# Patient Record
Sex: Female | Born: 1950
Health system: Southern US, Community
[De-identification: ages and names within clinical notes are randomized; demographics above are authoritative.]

## PROBLEM LIST (undated history)

## (undated) DIAGNOSIS — M1990 Unspecified osteoarthritis, unspecified site: Secondary | ICD-10-CM

## (undated) DIAGNOSIS — F419 Anxiety disorder, unspecified: Secondary | ICD-10-CM

## (undated) DIAGNOSIS — R000 Tachycardia, unspecified: Secondary | ICD-10-CM

## (undated) DIAGNOSIS — R011 Cardiac murmur, unspecified: Secondary | ICD-10-CM

## (undated) DIAGNOSIS — N189 Chronic kidney disease, unspecified: Secondary | ICD-10-CM

## (undated) DIAGNOSIS — R51 Headache: Secondary | ICD-10-CM

## (undated) DIAGNOSIS — Z9889 Other specified postprocedural states: Secondary | ICD-10-CM

## (undated) DIAGNOSIS — J189 Pneumonia, unspecified organism: Secondary | ICD-10-CM

## (undated) DIAGNOSIS — R519 Headache, unspecified: Secondary | ICD-10-CM

## (undated) DIAGNOSIS — K219 Gastro-esophageal reflux disease without esophagitis: Secondary | ICD-10-CM

## (undated) DIAGNOSIS — T8859XA Other complications of anesthesia, initial encounter: Secondary | ICD-10-CM

## (undated) DIAGNOSIS — R112 Nausea with vomiting, unspecified: Secondary | ICD-10-CM

## (undated) DIAGNOSIS — D649 Anemia, unspecified: Secondary | ICD-10-CM

## (undated) DIAGNOSIS — T4145XA Adverse effect of unspecified anesthetic, initial encounter: Secondary | ICD-10-CM

## (undated) HISTORY — PX: OTHER SURGICAL HISTORY: SHX169

## (undated) HISTORY — PX: CHOLECYSTECTOMY: SHX55

## (undated) HISTORY — PX: TENDON REPAIR: SHX5111

## (undated) HISTORY — PX: TONSILLECTOMY: SUR1361

## (undated) HISTORY — DX: Cardiac murmur, unspecified: R01.1

## (undated) HISTORY — PX: NEPHRECTOMY: SHX65

## (undated) HISTORY — PX: UPPER GASTROINTESTINAL ENDOSCOPY: SHX188

---

## 1993-08-06 HISTORY — PX: BREAST SURGERY: SHX581

## 1998-01-26 ENCOUNTER — Ambulatory Visit (HOSPITAL_BASED_OUTPATIENT_CLINIC_OR_DEPARTMENT_OTHER): Admission: RE | Admit: 1998-01-26 | Discharge: 1998-01-26 | Payer: Self-pay | Admitting: *Deleted

## 2000-01-17 ENCOUNTER — Encounter: Admission: RE | Admit: 2000-01-17 | Discharge: 2000-01-17 | Payer: Self-pay | Admitting: Obstetrics and Gynecology

## 2000-01-17 ENCOUNTER — Encounter: Payer: Self-pay | Admitting: Obstetrics and Gynecology

## 2000-11-26 ENCOUNTER — Ambulatory Visit (HOSPITAL_COMMUNITY): Admission: RE | Admit: 2000-11-26 | Discharge: 2000-11-26 | Payer: Self-pay | Admitting: Internal Medicine

## 2001-02-13 ENCOUNTER — Encounter: Payer: Self-pay | Admitting: Obstetrics and Gynecology

## 2001-02-13 ENCOUNTER — Encounter: Admission: RE | Admit: 2001-02-13 | Discharge: 2001-02-13 | Payer: Self-pay | Admitting: Obstetrics and Gynecology

## 2002-10-13 ENCOUNTER — Encounter: Admission: RE | Admit: 2002-10-13 | Discharge: 2002-10-13 | Payer: Self-pay | Admitting: Obstetrics and Gynecology

## 2002-10-13 ENCOUNTER — Encounter: Payer: Self-pay | Admitting: Obstetrics and Gynecology

## 2004-01-28 ENCOUNTER — Encounter: Admission: RE | Admit: 2004-01-28 | Discharge: 2004-01-28 | Payer: Self-pay | Admitting: Internal Medicine

## 2004-01-30 ENCOUNTER — Inpatient Hospital Stay (HOSPITAL_COMMUNITY): Admission: EM | Admit: 2004-01-30 | Discharge: 2004-02-02 | Payer: Self-pay | Admitting: Emergency Medicine

## 2004-02-10 ENCOUNTER — Ambulatory Visit (HOSPITAL_COMMUNITY): Admission: RE | Admit: 2004-02-10 | Discharge: 2004-02-10 | Payer: Self-pay | Admitting: Urology

## 2004-03-10 ENCOUNTER — Ambulatory Visit (HOSPITAL_COMMUNITY): Admission: RE | Admit: 2004-03-10 | Discharge: 2004-03-10 | Payer: Self-pay | Admitting: Urology

## 2004-06-26 ENCOUNTER — Encounter (INDEPENDENT_AMBULATORY_CARE_PROVIDER_SITE_OTHER): Payer: Self-pay | Admitting: *Deleted

## 2004-06-26 ENCOUNTER — Inpatient Hospital Stay (HOSPITAL_COMMUNITY): Admission: RE | Admit: 2004-06-26 | Discharge: 2004-06-28 | Payer: Self-pay | Admitting: Urology

## 2008-03-17 ENCOUNTER — Encounter: Admission: RE | Admit: 2008-03-17 | Discharge: 2008-03-17 | Payer: Self-pay | Admitting: Internal Medicine

## 2009-10-04 HISTORY — PX: ERCP: SHX60

## 2009-10-21 ENCOUNTER — Encounter (INDEPENDENT_AMBULATORY_CARE_PROVIDER_SITE_OTHER): Payer: Self-pay | Admitting: General Surgery

## 2009-10-21 ENCOUNTER — Ambulatory Visit: Payer: Self-pay | Admitting: Internal Medicine

## 2009-10-21 ENCOUNTER — Inpatient Hospital Stay (HOSPITAL_COMMUNITY): Admission: EM | Admit: 2009-10-21 | Discharge: 2009-10-23 | Payer: Self-pay | Admitting: Emergency Medicine

## 2010-08-11 ENCOUNTER — Encounter (INDEPENDENT_AMBULATORY_CARE_PROVIDER_SITE_OTHER): Payer: Self-pay | Admitting: *Deleted

## 2010-08-11 ENCOUNTER — Encounter: Payer: Self-pay | Admitting: Internal Medicine

## 2010-09-01 ENCOUNTER — Encounter (INDEPENDENT_AMBULATORY_CARE_PROVIDER_SITE_OTHER): Payer: Self-pay | Admitting: *Deleted

## 2010-09-01 ENCOUNTER — Other Ambulatory Visit: Payer: Self-pay | Admitting: Dermatology

## 2010-09-04 ENCOUNTER — Ambulatory Visit
Admission: RE | Admit: 2010-09-04 | Discharge: 2010-09-04 | Payer: Self-pay | Source: Home / Self Care | Attending: Internal Medicine | Admitting: Internal Medicine

## 2010-09-07 NOTE — Letter (Signed)
Summary: Pre Visit Letter Revised  [facility]  [address] [place], [place] [address]   Phone: [phone]  Fax: [phone]        08/11/2010 MRN: [id number] [facility] [address] [place], [place]  [address]             Procedure Date:  09-18-10   Welcome to the Gastroenterology Division at [facility].    You are scheduled to see a nurse for your pre-procedure visit on 09-04-10 at 10:30A.M. on the 3rd floor at [facility], [address]  We ask that you try to arrive at our office 15 minutes prior to your appointment time to allow for check-in.  Please take a minute to review the attached form.  If you answer "Yes" to one or more of the questions on the first page, we ask that you call the person listed at your earliest opportunity.  If you answer "No" to all of the questions, please complete the rest of the form and bring it to your appointment.    Your nurse visit will consist of discussing your medical and surgical history, your immediate family medical history, and your medications.   If you are unable to list all of your medications on the form, please bring the medication bottles to your appointment and we will list them.  We will need to be aware of both prescribed and over the counter drugs.  We will need to know exact dosage information as well.    Please be prepared to read and sign documents such as consent forms, a financial agreement, and acknowledgement forms.  If necessary, and with your consent, a friend or relative is welcome to sit-in on the nurse visit with you.  Please bring your insurance card so that we may make a copy of it.  If your insurance requires a referral to see a specialist, please bring your referral form from your primary care physician.  No co-pay is required for this nurse visit.     If you cannot keep your appointment, please call [phone] to cancel or reschedule prior to your appointment date.  This allows  [place] the opportunity to schedule an appointment for another patient in need of care.    Thank you for choosing [facility] for your medical needs.  We appreciate the opportunity to care for you.  Please visit [place] at our website  to learn more about our practice.  Sincerely, The Gastroenterology Division

## 2010-09-07 NOTE — Letter (Signed)
Summary: Medical Hx & Medication/[facility]  Medical Hx & Medication/[facility]   Imported By: Sherian Rein 08/23/2010 15:14:28  _____________________________________________________________________  External Attachment:    Type:   Image     Comment:   External Document

## 2010-09-07 NOTE — Letter (Signed)
Summary: [facility]   Imported By: Sherian Rein 08/23/2010 15:12:42  _____________________________________________________________________  External Attachment:    Type:   Image     Comment:   External Document

## 2010-09-13 NOTE — Letter (Signed)
Summary: [facility] Instructions  [facility]  [address] [place], [place] [address]   Phone: [phone]  Fax: [phone]       [name]    02-19-1951    MRN: [id number]        Procedure Day /Date: Monday 09-18-10     Arrival Time: 9:30 am     Procedure Time: 10:30 am     Location of Procedure:                    _x _  [facility] (4th Floor)                        PREPARATION FOR COLONOSCOPY WITH MOVIPREP   Starting 5 days prior to your procedure  09-13-10  do not eat nuts, seeds, popcorn, corn, beans, peas,  salads, or any raw vegetables.  Do not take any fiber supplements (e.g. Metamucil, Citrucel, and Benefiber).  THE DAY BEFORE YOUR PROCEDURE         DATE:  09-17-10   DAY: Sunday   1.  Drink clear liquids the entire day-NO SOLID FOOD  2.  Do not drink anything colored red or purple.  Avoid juices with pulp.  No orange juice.  3.  Drink at least 64 oz. (8 glasses) of fluid/clear liquids during the day to prevent dehydration and help the prep work efficiently.  CLEAR LIQUIDS INCLUDE: Water Jello Ice Popsicles Tea (sugar ok, no milk/cream) Powdered fruit flavored drinks Coffee (sugar ok, no milk/cream) Gatorade Juice: apple, white grape, white cranberry  Lemonade Clear bullion, consomm, broth Carbonated beverages (any kind) Strained chicken noodle soup Hard Candy                             4.  In the morning, mix first dose of MoviPrep solution:    Empty 1 Pouch A and 1 Pouch B into the disposable container    Add lukewarm drinking water to the top line of the container. Mix to dissolve    Refrigerate (mixed solution should be used within 24 hrs)  5.  Begin drinking the prep at 5:00 p.m. The MoviPrep container is divided by 4 marks.   Every 15 minutes drink the solution down to the next mark (approximately 8 oz) until the full liter is complete.   6.  Follow completed prep with 16 oz of clear liquid of your choice  (Nothing red or purple).  Continue to drink clear liquids until bedtime.  7.  Before going to bed, mix second dose of MoviPrep solution:    Empty 1 Pouch A and 1 Pouch B into the disposable container    Add lukewarm drinking water to the top line of the container. Mix to dissolve    Refrigerate  THE DAY OF YOUR PROCEDURE      DATE:  09-18-10  DAY: Monday  Beginning at  5:30 a.m. (5 hours before procedure):         1. Every 15 minutes, drink the solution down to the next mark (approx 8 oz) until the full liter is complete.  2. Follow completed prep with 16 oz. of clear liquid of your choice.    3. You may drink clear liquids until  8:30 a.m. (2 HOURS BEFORE PROCEDURE).   MEDICATION INSTRUCTIONS  Unless otherwise instructed, you should take regular prescription medications with a small sip of water  as early as possible the morning of your procedure.          OTHER INSTRUCTIONS  You will need a responsible adult at least 59 years of age to accompany you and drive you home.   This person must remain in the waiting room during your procedure.  Wear loose fitting clothing that is easily removed.  Leave jewelry and other valuables at home.  However, you may wish to bring a book to read or  an iPod/MP3 player to listen to music as you wait for your procedure to start.  Remove all body piercing jewelry and leave at home.  Total time from sign-in until discharge is approximately 2-3 hours.  You should go home directly after your procedure and rest.  You can resume normal activities the  day after your procedure.  The day of your procedure you should not:   Drive   Make legal decisions   Operate machinery   Drink alcohol   Return to work  You will receive specific instructions about eating, activities and medications before you leave.    The above instructions have been reviewed and explained to me by   Ezra Sites RN  September 04, 2010 10:41 AM    I fully  understand and can verbalize these instructions _____________________________ Date _________

## 2010-09-13 NOTE — Miscellaneous (Signed)
Summary: [facility]  Clinical Lists Changes  Medications: Added new medication of MOVIPREP 100 GM  SOLR (PEG-KCL-NACL-NASULF-NA ASC-C) As per prep instructions. - Signed Rx of MOVIPREP 100 GM  SOLR (PEG-KCL-NACL-NASULF-NA ASC-C) As per prep instructions.;  #1 x 0;  Signed;  Entered by: Ezra Sites RN;  Authorized by: Iva Boop MD, [facility];  Method used: Electronically to [facility] [address], [place], [place]  [address], Ph: [phone] or [phone], Fax: [phone] Observations: Added new observation of NKA: T (09/04/2010 10:24)    Prescriptions: MOVIPREP 100 GM  SOLR (PEG-KCL-NACL-NASULF-NA ASC-C) As per prep instructions.  #1 x 0   Entered by:   Ezra Sites RN   Authorized by:   Iva Boop MD, [facility]   Signed by:   Ezra Sites RN on 09/04/2010   Method used:   Electronically to        [facility] (retail)       [address]       [place], [place]  [address]       Ph: [phone] or [phone]       Fax: [phone]   RxID:   [id number]

## 2010-09-18 ENCOUNTER — Other Ambulatory Visit: Payer: Self-pay | Admitting: Internal Medicine

## 2010-10-27 ENCOUNTER — Encounter: Payer: Self-pay | Admitting: Internal Medicine

## 2010-10-29 LAB — CBC
HCT: 33.7 % — ABNORMAL LOW (ref 36.0–46.0)
Hemoglobin: 10.4 g/dL — ABNORMAL LOW (ref 12.0–15.0)
Hemoglobin: 11.3 g/dL — ABNORMAL LOW (ref 12.0–15.0)
MCHC: 33.6 g/dL (ref 30.0–36.0)
MCHC: 33.9 g/dL (ref 30.0–36.0)
Platelets: 130 10*3/uL — ABNORMAL LOW (ref 150–400)
Platelets: 175 10*3/uL (ref 150–400)
RBC: 3.32 MIL/uL — ABNORMAL LOW (ref 3.87–5.11)
RBC: 3.64 MIL/uL — ABNORMAL LOW (ref 3.87–5.11)
RDW: 13.8 % (ref 11.5–15.5)
RDW: 14.4 % (ref 11.5–15.5)
WBC: 4.9 10*3/uL (ref 4.0–10.5)

## 2010-10-29 LAB — COMPREHENSIVE METABOLIC PANEL
ALT: 474 U/L — ABNORMAL HIGH (ref 0–35)
ALT: 610 U/L — ABNORMAL HIGH (ref 0–35)
ALT: 70 U/L — ABNORMAL HIGH (ref 0–35)
AST: 252 U/L — ABNORMAL HIGH (ref 0–37)
AST: 643 U/L — ABNORMAL HIGH (ref 0–37)
Alkaline Phosphatase: 172 U/L — ABNORMAL HIGH (ref 39–117)
Alkaline Phosphatase: 73 U/L (ref 39–117)
CO2: 23 mEq/L (ref 19–32)
Calcium: 9 mg/dL (ref 8.4–10.5)
Chloride: 104 mEq/L (ref 96–112)
Chloride: 106 mEq/L (ref 96–112)
GFR calc Af Amer: 59 mL/min — ABNORMAL LOW (ref >60)
GFR calc Af Amer: >60 mL/min (ref >60)
GFR calc Af Amer: >60 mL/min (ref >60)
GFR calc non Af Amer: 49 mL/min — ABNORMAL LOW (ref >60)
GFR calc non Af Amer: >60 mL/min (ref >60)
Glucose, Bld: 99 mg/dL (ref 70–99)
Sodium: 135 mEq/L (ref 135–145)
Sodium: 145 mEq/L (ref 135–145)
Total Bilirubin: 0.3 mg/dL (ref 0.3–1.2)
Total Bilirubin: 3.3 mg/dL — ABNORMAL HIGH (ref 0.3–1.2)
Total Protein: 6 g/dL (ref 6.0–8.3)

## 2010-10-29 LAB — URINALYSIS, ROUTINE W REFLEX MICROSCOPIC
Bilirubin Urine: NEGATIVE
Specific Gravity, Urine: 1.015 (ref 1.005–1.030)
pH: 6 (ref 5.0–8.0)

## 2010-10-29 LAB — DIFFERENTIAL
Basophils Absolute: 0.1 10*3/uL (ref 0.0–0.1)
Basophils Relative: 1 % (ref 0–1)
Monocytes Absolute: 0.6 10*3/uL (ref 0.1–1.0)
Neutrophils Relative %: 57 % (ref 43–77)

## 2010-10-29 LAB — URINE MICROSCOPIC-ADD ON

## 2010-10-30 ENCOUNTER — Encounter: Payer: Self-pay | Admitting: Internal Medicine

## 2010-10-30 ENCOUNTER — Ambulatory Visit (AMBULATORY_SURGERY_CENTER): Payer: BLUE CROSS/BLUE SHIELD | Admitting: Internal Medicine

## 2010-10-30 VITALS — BP 148/74 | HR 77 | Temp 98.5°F | Resp 26 | Ht 65.0 in | Wt 155.0 lb

## 2010-10-30 DIAGNOSIS — K621 Rectal polyp: Secondary | ICD-10-CM

## 2010-10-30 DIAGNOSIS — K5730 Diverticulosis of large intestine without perforation or abscess without bleeding: Secondary | ICD-10-CM

## 2010-10-30 DIAGNOSIS — D126 Benign neoplasm of colon, unspecified: Secondary | ICD-10-CM

## 2010-10-30 DIAGNOSIS — D129 Benign neoplasm of anus and anal canal: Secondary | ICD-10-CM

## 2010-10-30 DIAGNOSIS — K620 Anal polyp: Secondary | ICD-10-CM

## 2010-10-30 DIAGNOSIS — D128 Benign neoplasm of rectum: Secondary | ICD-10-CM

## 2010-10-30 DIAGNOSIS — Z1211 Encounter for screening for malignant neoplasm of colon: Secondary | ICD-10-CM

## 2010-10-30 NOTE — Progress Notes (Signed)
PER MD--POLYP,MILD SIGMOID DIVERTICULOSIS, SMALL HEMORRHOIDS

## 2010-10-30 NOTE — Patient Instructions (Signed)
See discharge instructions and educational materials. Call [facility] [phone] for any problems or questions.

## 2010-10-31 ENCOUNTER — Telehealth: Payer: Self-pay | Admitting: *Deleted

## 2010-10-31 NOTE — Telephone Encounter (Signed)

## 2010-11-03 ENCOUNTER — Encounter: Payer: Self-pay | Admitting: Internal Medicine

## 2010-11-03 NOTE — Progress Notes (Signed)
Quick Note:  2 hyperplastic polyps Int hemorrhoids Diverticulosis 10/2020 repeat colonoscopy ______

## 2010-11-07 NOTE — Procedures (Signed)
Summary: Colonoscopy  Patient: [name] Note: All result statuses are Final unless otherwise noted.  Tests: (1) Colonoscopy (COL)   COL Colonoscopy           DONE     [facility]     [address]     [place], [place]  [address]          COLONOSCOPY PROCEDURE REPORT          PATIENT:  [name], [name]  MR#:  [id number]     BIRTHDATE:  09-13-1950, 60 yrs. old  GENDER:  female     ENDOSCOPIST:  Iva Boop, MD, [facility]     REF. BY:  [name], M.D.     PROCEDURE DATE:  10/30/2010     PROCEDURE:  Colonoscopy with biopsy and snare polypectomy     ASA CLASS:  Class I     INDICATIONS:  Routine Risk Screening     MEDICATIONS:   Fentanyl 75 mcg IV, Versed 8 mg          DESCRIPTION OF PROCEDURE:   After the risks benefits and     alternatives of the procedure were thoroughly explained, informed     consent was obtained.  Digital rectal exam was performed and     revealed no abnormalities.   The LB [id number]-[id number] endoscope     was introduced through the anus and advanced to the cecum, which     was identified by both the appendix and ileocecal valve, without     limitations.  The quality of the prep was excellent, using     MoviPrep.  The instrument was then slowly withdrawn as the colon     was fully examined. Insertion: 7 minutes 6 seconds , withdrawal:     15 minutes 40 seconds.     <<PROCEDUREIMAGES>>          FINDINGS:  Two polyps were found. 3 mm distal sigmoid polyp     removed by cold biopsy. 5 mm flat rectal polyp removed by cold     snare. Both sent to pathology.  Mild diverticulosis was found in     the sigmoid colon.  This was otherwise a normal examination of the     colon.   Retroflexed views in the rectum revealed internal     hemorrhoids.    The scope was then withdrawn from the patient and     the procedure completed.          COMPLICATIONS:  None     ENDOSCOPIC IMPRESSION:     1) Two diminutive  polyps removed     2) Mild diverticulosis  in the sigmoid colon     3) Internal hemorrhoids - small     4) Otherwise normal examination with excellent prep          REPEAT EXAM:  In for Colonoscopy, pending biopsy results.          Iva Boop, MD, [facility]          CC:  [name], MD and The Patient          n.     eSIGNED:   Iva Boop at 10/30/2010 10:17 AM          Alejandro Mulling, [id number]  Note: An exclamation mark (!) indicates a result that was not dispersed into the flowsheet. Document Creation Date: 10/30/2010 10:17 AM _______________________________________________________________________  (1) Order result status: Final Collection or observation  date-time: 10/30/2010 09:48 Requested date-time:  Receipt date-time:  Reported date-time:  Referring Physician:   Ordering Physician: Stan Head [phone]) Specimen Source:  Source: Launa Grill Order Number: [id number] Lab site:

## 2010-12-22 NOTE — Discharge Summary (Signed)
[name], [name]               ACCOUNT NO.:  [id number]   MEDICAL RECORD NO.:  [id number]          PATIENT TYPE:  INP   LOCATION:  0342                         FACILITY:  [facility]   PHYSICIAN:  Lindaann Slough, M.D.  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   PROCEDURE:  Left nephrectomy on June 26, 2004.   HISTORY OF PRESENT ILLNESS:  The patient is a 60 year old female who was  seen in June 2005 for 2 days history of left flank pain. A CT scan done  before the visit had shown a complex cyst in the upper pole of the left  kidney. Then, she spiked a temperature to 103.5, and CT scan of the abdomen  and pelvis with contrast showed that the complex cyst was really an  obstructed upper pole left kidney. A percutaneous drainage of the upper pole  of the kidney was done, and subsequently an antegrade double J catheter was  inserted in the kidney. The patient was scheduled for left upper pole  nephrectomy. At the time of surgery, it was felt that it was best to remove  the kidney. Because of the location of the renal vessels, it was difficult  to remove the upper pole and also the inflammatory changes of the kidney.   PHYSICAL EXAMINATION:  VITAL SIGNS:  On physical exam, her blood pressure  was 128/78, pulse 82, respirations 16, temperature 97.8, and weight 166  pounds.  LUNGS:  Clear.  HEART:  Regular rhythm.  ABDOMEN:  Soft and nondistended, nontender. Kidneys not palpable. Bowel  sounds were normal.   LABORATORY DATA:  Her hemoglobin on admission was 12.4, hematocrit 36.8, and  WBC 4.1, BUN 15, creatinine 1.2, sodium 138, potassium 4.5. Urinalysis  showed 25,000 colonies of multiple species. Chest x-ray showed no evidence  of active disease. An EKG showed marked sinus  bradycardia.   HOSPITAL COURSE:  Postoperatively, the patient was lethargic on the first  day postoperative, probably secondary to pain medication. The pain  medication was then discontinued, and on the second day postoperative, she  started to feel better. She started to eat better, and she was voiding on  her own, and she remained afebrile throughout the hospital course. On the  evening of June 28, 2004, she was afebrile. She was eating well. Her  wound was clean and dry. Pathology report showed chronic interstitial  nephritis in the upper pole and mild interstitial chronic inflammation of  the lower pole. There was no evidence of malignancy.   DISCHARGE MEDICATIONS:  The patient was discharged home on:  1.  Keflex 250 mg p.o. 3 times a day,  2.  Darvocet 100 one or two tablets q.4h. p.r.n. for pain.   DISCHARGE DIET:  Regular.   CONDITION ON DISCHARGE:  Improved.   ACTIVITY:  The patient is instructed not to do any lifting, straining,  driving for about six weeks.  FOLLOW UP:  She will be followed in the office in about 3 weeks.      MN/MEDQ  D:  06/28/2004  T:  06/29/2004  Job:  [id number]   cc:   [name], M.D.  [address] [place]  [place] [address]  Fax: [phone]

## 2010-12-22 NOTE — Consult Note (Signed)
[name], [name]                         ACCOUNT NO.:  [id number]   MEDICAL RECORD NO.:  [id number]                   PATIENT TYPE:  INP   LOCATION:  5524                                 FACILITY:  [facility]   PHYSICIAN:  Lindaann Slough, M.D.               DATE OF BIRTH:  April 10, 1951   DATE OF CONSULTATION:  01/30/2004  DATE OF DISCHARGE:                                   CONSULTATION   CONSULTING PHYSICIAN:  Lindaann Slough, M.D.   REASON FOR CONSULTATION:  Left flank pain.   The patient is a 60 year old female who was seen in the office on Friday for  a two day history of left flank pain.  She was seen by Dr. Earl Gala and had a  CT scan of the abdomen and pelvis and that showed a complex cyst in the  upper pole of the left kidney.  The pain has not been associated with nausea  and vomiting.  She was given 100 mg of Demerol IM and was sent home on oral  Demerol, and she was scheduled for a CT scan of the abdomen and pelvis with  IV and oral contrast; however, yesterday, she spiked a temperature to 103.5  and was then admitted for further evaluation and treatment.  She still does  not have any nausea or vomiting.  She did not have a bowel movement  yesterday, but she did not have any changes in her bowel habits.   PAST MEDICAL HISTORY:  Asthma.   PAST SURGICAL HISTORY:  Laparoscopy surgery on her wrist.   FAMILY HISTORY:  Positive for diabetes.   SOCIAL HISTORY:  She is married, has two children.  She does not smoke and  drinks a glass of wine a day.   ALLERGIES:  No known drug allergies.   PHYSICAL EXAMINATION:  VITAL SIGNS:  Her temperature went up to 104.1, blood  pressure is 130/52, pulse 115, respirations 20.  GENITOURINARY:  She has left CVA tenderness and tenderness of the left  flank.   Her hemoglobin is 9.9, hematocrit 29.0, and WBC 12.1, neutrophils 83%.  Sodium is 134, potassium 3.6, BUN is 12, creatinine 1.4.  Her urinalysis  shows 100 mg of protein and 15 mg  of ketones, 7-10 WBCs, 0-2 RBCs, and a few  bacteria.   IMPRESSION:  Complex cyst on the left kidney, rule out perinephric abscess,  and rule out renal abscess.   SUGGESTION:  CT scan of the abdomen and pelvis without and with IV and oral  contrast.   We will follow the patient with you.                                               Lindaann Slough, M.D.   MN/MEDQ  D:  01/30/2004  T:  01/30/2004  Job:  [id number]

## 2010-12-22 NOTE — H&P (Signed)
NAMEVANNIE, [name]                         ACCOUNT NO.:  [id number]   MEDICAL RECORD NO.:  [id number]                   PATIENT TYPE:  INP   LOCATION:  1828                                 FACILITY:  [facility]   PHYSICIAN:  Hal T. Stoneking, M.D.              DATE OF BIRTH:  Dec 22, 1950   DATE OF ADMISSION:  01/30/2004  DATE OF DISCHARGE:                                HISTORY & PHYSICAL   IDENTIFYING DATA:  Ms. Hyneman is a very-nice, healthy, 60 year old white  female.  She has had apparently a prior history of periodic back pain.  She  has been told in the past that she had a renal cyst.  She was in her usual  state of health until this past Tuesday night when she developed fairly-  severe, left lower-pelvic and back pain.  She had been seen in the office on  Wednesday, Thursday and Friday.  On Thursday and Friday, she had an  injection of Rocephin on each of those days.  On Friday, she had a CT scan  without contrast.  Preliminary verbal report revealed a fairly large cyst in  the left kidney that very well could be infected.  She saw Dr. Brunilda Payor of  urology Friday evening, and he offered to admit her at that point, but also  told her some additional testing would need to be done to confirm if this  was an infected cyst.  She elected to return home.  Yesterday, she felt a  little bit better, but then late in the afternoon had a shaking chill and a  fever of 103.5.  She has had no cough, no diarrhea, and again complains of  the severe left-lower quadrant to pelvic and left-flank pain.   ALLERGIES:  She is allergic to CODEINE.   CURRENT MEDICATIONS:  P.r.n. Flonase and albuterol.   PAST MEDICAL HISTORY:  1. Remarkable for allergic rhinitis and asthma, fairly mild.  2. Past history of renal cyst.  3. No history of diabetes, coronary artery disease, stroke, hypertension or     cancer.   PREVIOUS SURGERY:  She had laparoscopic pelvic exam a number of years ago,  no specific diagnosis  rendered.  She has had a couple of cysts removed from  her wrist in the past.   FAMILY HISTORY:  Remarkable for diabetes mellitus in distant family members.  Her father had rheumatic fever and resultant rheumatic valvular heart  disease.   SOCIAL HISTORY:  She is married and has two daughter, ages 46 and 9.  She  has a __________ [employer].  She does not smoke.  She drinks one  glass of wine each evening.   REVIEW OF SYSTEMS:  No headache, no change in vision or hearing, no cough,  no shortness of breath, flank and back pain -- please see HPI.  She denies  any dysuria, no vaginal bleeding or discharge.   PHYSICAL EXAMINATION:  VITAL SIGNS:  Temperature 102.9, blood pressure  146/77, O2 saturations 93%, pulse 110.  HEENT:  Pupils equal, round and reactive to light.  Discs are sharp.  TM's  are normal.  NECK:  Supple.  LUNGS:  Clear.  HEART:  Regular tachycardia without murmur.  ABDOMEN:  Bowel sounds are present. No hepatosplenomegaly or masses  palpated.  She is tender to percussion in the left lower-quadrant.  PELVIC/RECTAL:  No done.  She has a routine pelvic exam about one week ago  by her gynecologist.  EXTREMITIES:  She has no lower-extremity edema.   LABORATORY DATA:  On admission, urinalysis remarkable for 7-10 wbc's, 0-2  rbc's.  Sodium 134, potassium 3.6, chloride 103, BUN 12, glucose 99.  White  count 12,100, hemoglobin 10.3, MCV 89.4, RDW 13.4, platelet count 187,000,  83% neutrophils, 7% lymphocytes, 10% monocytes.   Preliminary CT scan report reveals possible infected left renal cyst.   ASSESSMENT:  1. Infected left nephric cyst.  She will need IV antibiotics and likely need     percutaneous drainage.  We will need to contact urology regarding the     best approach.  2. Anemia.  Question if this might be mild iron-deficiency anemia on top of     bone marrow suppression due to her current bacterial infection.   PLAN:  1. Admit.  2. She has already had  blood and urine cultures.  We will await these     results.  3. We will also start nafcillin and gentamicin.  4. Obtain urology consultation.                                                Hal T. Pete Glatter, M.D.    HTS/MEDQ  D:  01/30/2004  T:  01/30/2004  Job:  [id number]

## 2010-12-22 NOTE — Discharge Summary (Signed)
[name], Calderon                         ACCOUNT NO.:  [id number]   MEDICAL RECORD NO.:  [id number]                   PATIENT TYPE:  INP   LOCATION:  5524                                 FACILITY:  [facility]   PHYSICIAN:  [name], M.D.                 DATE OF BIRTH:  1951-06-09   DATE OF ADMISSION:  01/29/2004  DATE OF DISCHARGE:  02/02/2004                                 DISCHARGE SUMMARY   ADMISSION DIAGNOSIS:  Pyelonephritis.   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   HISTORY OF PRESENT ILLNESS:  The patient is a 60 year old, white female with  no significant past medical history.  She said she has mild asthma for which  she uses albuterol on a very rare basis.  She developed back pain 3 days  prior to admission and was seen in the office with evidence of  pyelonephritis and treated with Rocephin.  She did not improve.  She  underwent a CT scan without contrast which revealed evidence of  pyelonephrosis.  She was seen in consultation by Dr. Brunilda Payor, but was feeling  somewhat better.  She was not admitted at that time.  Subsequent to that,  however, she developed fever and chills and came to the emergency room and  admitted.   HOSPITAL COURSE:  The patient was admitted and placed on IV antibiotics.  She was seen by Dr. Brunilda Payor in consultation.  A repeat CT scan was done at this  time with contrast.  This revealed findings of what was thought to be an  accessory kidney.  There was evidence of pyelonephrosis.  On June 26, the  patient was underwent percutaneous nephrostomy for external drainage.  This  was quite successful and the patient improved considerably.  Her antibiotics  were changed to oral and she was instructed on how to take care of an  external drainage catheter.  On June 28, there was an attempt to put in a  double-J stent between the infected kidney and the ureter, but this was not  successful.  Therefore, it was decided the patient could go home with  external drainage.   CONDITION ON DISCHARGE:  The patient was discharged in improved condition.   DISCHARGE MEDICATIONS:  1. Levaquin 5 mg daily x10 days.  2. Use Tylenol as needed for pain management.   ACTIVITY:  No restrictions.   WOUND CARE:  She was instructed in care of the percutaneous nephrostomy.   FOLLOW UP:  She will call to make an appointment to see me in 2 weeks.  She  will see Dr. Brunilda Payor and follow with him as well.  [name], M.D.    JO/MEDQ  D:  03/02/2004  T:  03/02/2004  Job:  [id number]

## 2010-12-22 NOTE — Op Note (Signed)
NAMESOPHRONIA, VARNEY               ACCOUNT NO.:  [id number]   MEDICAL RECORD NO.:  [id number]          PATIENT TYPE:  INP   LOCATION:  0342                         FACILITY:  [facility]   PHYSICIAN:  Lindaann Slough, M.D.  DATE OF BIRTH:  [birth year]   DATE OF PROCEDURE:  06/26/2004  DATE OF DISCHARGE:                                 OPERATIVE REPORT   PREOPERATIVE DIAGNOSIS:  Obstructed upper pole left kidney.   POSTOPERATIVE DIAGNOSIS:  Obstructed upper pole left kidney.   PROCEDURE PERFORMED:  Left nephrectomy.   SURGEON:  Lindaann Slough, M.D.   ASSISTANTS:  Sigmund I. Patsi Sears, M.D., Rhae Lerner, M.D.   ANESTHESIA:  General.   INDICATIONS:  Patient is a 60 year old female who was seen in the office in  June, 2005 for left flank pain.  A CT scan done before her office visit  showed a complex cyst in the upper pole on the left kidney.  Two days later,  she was seen in the emergency room with a temperature of 103.1.  A CT scan  of the abdomen and pelvis with IV contrast showed an obstructed upper pole  left kidney.  Percutaneous nephrostomy was done to drain the upper pole of  the kidney.  She subsequently had an antegrade insertion of a double-J  catheter.  Patient is scheduled today for exploration of the left kidney  with upper pole nephrectomy.  Under general anesthesia, the patient was  prepped and draped and placed in the right lateral decubitus position, left  side up.  A transverse incision was then made over the 12th rib.  The  incision was carried down to the subcutaneous tissues.  Then the external  oblique muscle was incised.  The tip of the 12th rib was dissected from the  surrounding tissues and excised.  During the dissection of the 12th rib,  there was puncture of the pleura.  That opening was closed with 3-0 Vicryl.  After over-inflating the lung and putting a #8 red Robinson catheter through  that opening, the incision was closed with 3-0 Vicryl.  Then  the internal  oblique muscle was incised, and the dorsal lumbar fascia was incised.  The  kidney was then dissected from the surrounding tissues.  The upper pole was  adherent to the surrounding tissues but was bluntly and sharply dissected  from the surrounding tissues.  The renal vessels entered the kidney through  the upper pole.  Because of the location of the renal vessels, it was felt  that it was better to remove the entire kidney.  It would be difficult to do  an upper pole nephrectomy while sparing the renal vessels.  Also, the renal  artery was also small as well as the renal vein.  The renal artery was then  ligated with hemoclips and cut in the __________  ligatures with two clips  on the remaining stump.  The renal vein was also doubly ligated with  hemoclips and cut in between ligatures with two clips on the remaining  stump.  The kidney was then completely dissected from the surrounding  tissues.  The ureter was identified, and a ureterotomy was then done.  Then  the double-J catheter was removed.  The ureter was then ligated with  hemoclips, and the kidney was removed.  Hemostasis was completed with  electrocautery.  The wound was then irrigated with normal saline.  The  incision was then closed in three layers as follows:  The dorsal lumbar  fascia and transversus muscle.  The transversus muscle was closed with 0  PDS.  The internal oblique was closed with 0 PDS.  The external oblique was  closed with 0 PDS.  The skin was closed with 4-0 Monocryl using subcuticular  sutures.  __________ occasion.   ESTIMATED BLOOD LOSS:  100 cc.   BLOOD REPLACEMENT:  None.   Patient tolerated the procedure well and left the OR in satisfactory  condition to post anesthesia care unit.      MN/MEDQ  D:  06/26/2004  T:  06/26/2004  Job:  [id number]   cc:   [name], M.D.  [address] [place]  [place] [address]  Fax: [phone]

## 2010-12-22 NOTE — H&P (Signed)
NAMEGIZZELLE, [name]               ACCOUNT NO.:  [id number]   MEDICAL RECORD NO.:  [id number]          PATIENT TYPE:  INP   LOCATION:  X003                         FACILITY:  [facility]   PHYSICIAN:  Lindaann Slough, M.D.  DATE OF BIRTH:  [birth year]   DATE OF ADMISSION:  06/26/2004  DATE OF DISCHARGE:                                HISTORY & PHYSICAL   CHIEF COMPLAINT:  Obstructed upper pole, left kidney.   HISTORY OF PRESENT ILLNESS:  The patient is a 60 year old female who was  first seen in the office in June of 2005 for a 2-day history of left flank  pain. A CT scan of the abdomen and pelvis done by Dr. [name] showed a  complex cyst in the upper pole of the left kidney. Then the patient spiked  temperature to 103.5 and a CT scan of the abdomen and pelvis with contrast  showed that the complex cyst was an obstructive upper pole of left kidney.  A left percutaneous drainage of the upper pole kidney was done and  subsequently, she had an antegrade insertion of a double J catheter.  She is  scheduled today for left upper pole nephrectomy.   PAST MEDICAL HISTORY:  She has a history of asthma.   PAST SURGICAL HISTORY:  She had surgery on her wrist in 1989 and laparoscopy  in 1978.   FAMILY HISTORY:  Her father died of a heart attack at age 68 and had  prostate cancer and hypertension.  Her mother died of colon cancer at age 8  and had diabetes.  She has 1 sister and 1 brother.   SOCIAL HISTORY:  She is married and has 2 children, does not smoke and  drinks occasionally.   ALLERGIES:  She has no known drug allergies.   MEDICATIONS:  She is on Flonase, Vicodin p.r.n., Phenergan p.r.n.   REVIEW OF SYSTEMS:  She has a dry cough.  She has no shortness of breath, no  palpitation.  She has no nausea and no vomiting, no diarrhea or constipation  and she has frequency at this time and no dysuria hematuria.   PHYSICAL EXAMINATION:  GENERAL:  This is a well-built 60 year old female  in  no acute distress.  VITAL SIGNS:  Her blood pressure is 128/78, pulse 82, respirations 16,  temperature 97.8 and weight 166 pounds.  HEENT:  Her head is normal.  Pupils are equal and reactive to light and  accommodation.  Ears, nose and throat within normal limits.  NECK:  Supple.  No cervical lymph nodes.  No thyromegaly.  CHEST:  Symmetrical.  Lungs are fully expanded and clear to percussion and  auscultation.  HEART:  Regular rhythm.  No murmurs.  No gallops.  ABDOMEN:  Abdomen is soft, non-distended and nontender.  She has no CVA  tenderness.  Kidneys are not palpable.  She has no hepatomegaly and no  splenomegaly.  Bowel sounds are normal.  GENITALIA:  She has normal external female genitalia.  Her meatus is normal.  She has no caruncle.  She has no cystocele and she has no tenderness  in the  bladder area.  The cervix is firm, in the midline and nontender.   IMPRESSION:  Obstructed upper pole, left kidney.      MN/MEDQ  D:  06/26/2004  T:  06/26/2004  Job:  [id number]

## 2011-02-23 IMAGING — US US ABDOMEN COMPLETE
1 series · 14 of 25 positions shown · non-contrast
Comparison: CT abdomen 01/30/2004.

CLINICAL DATA: 59-year-old female with right upper quadrant pain.

COMPLETE ABDOMINAL ULTRASOUND

[Series 1: us abdomen complete · 0.30mm/px · 14 of 46 slices shown]
[im 1/46]
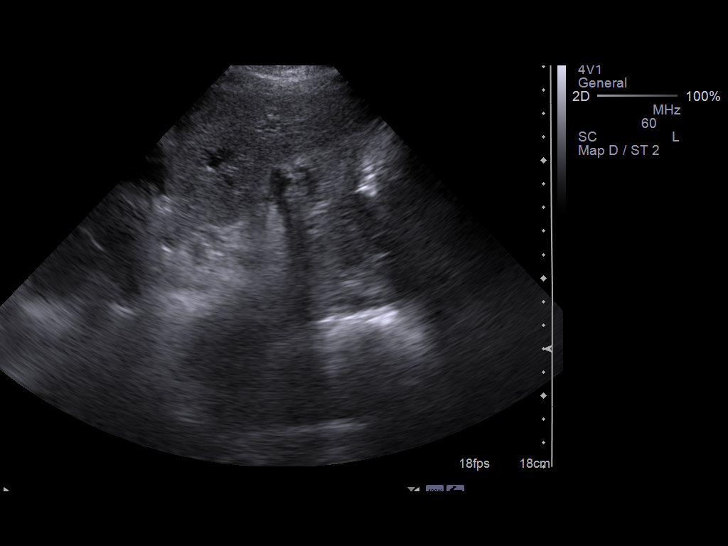
[im 4/46]
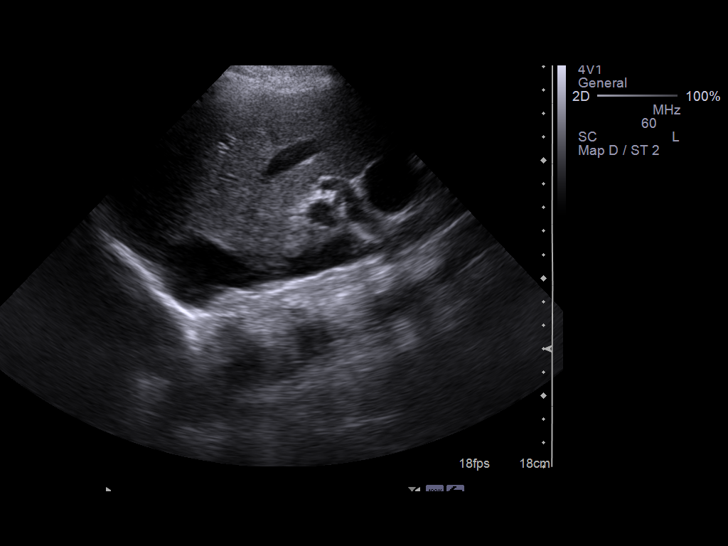
[im 8/46]
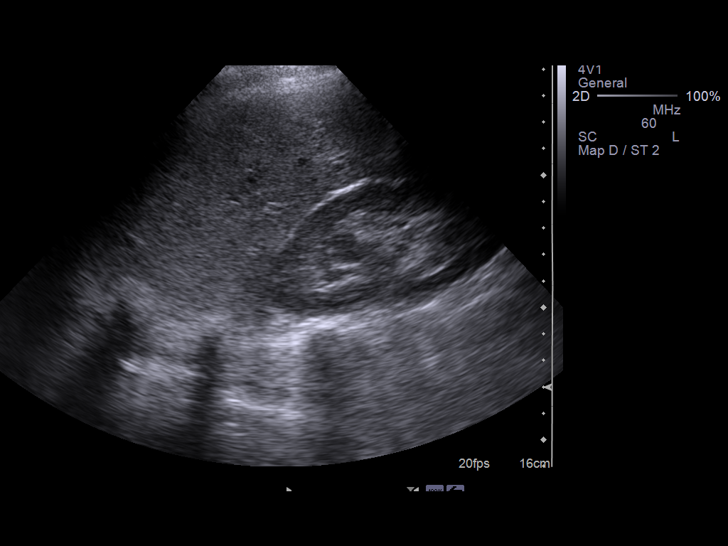
[im 12/46]
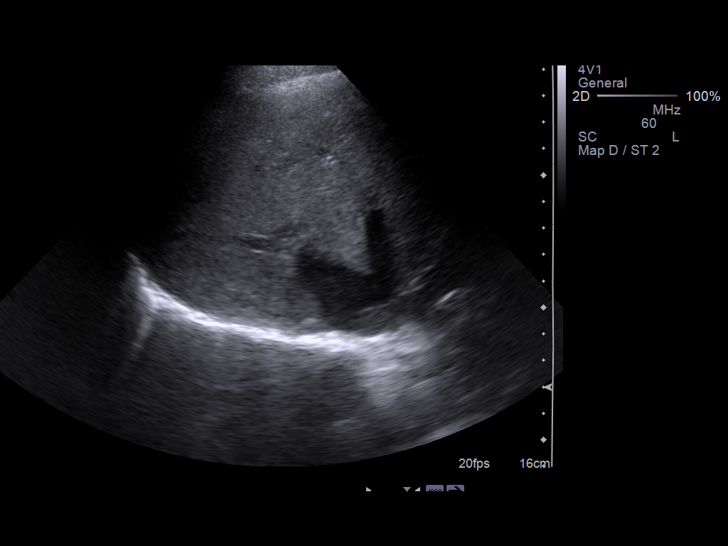
[im 16/46]
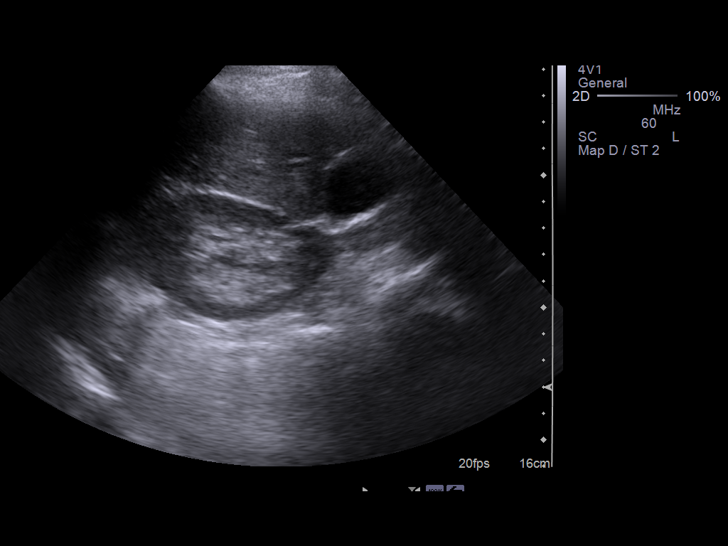
[im 17/46]
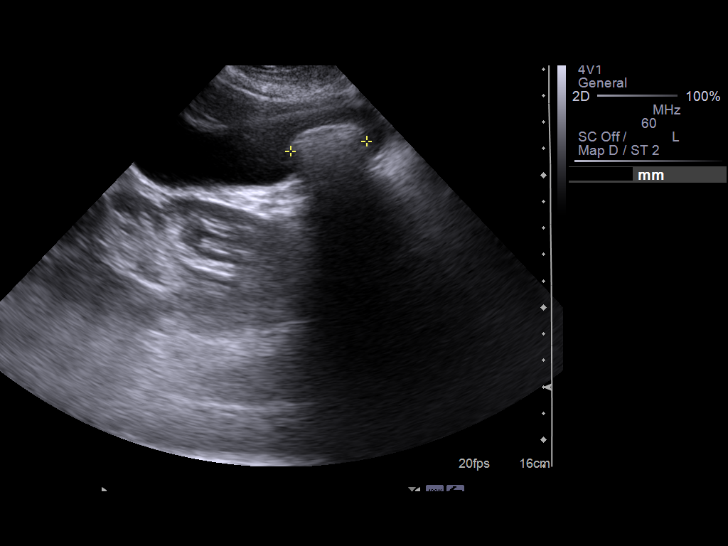
[im 21/46]
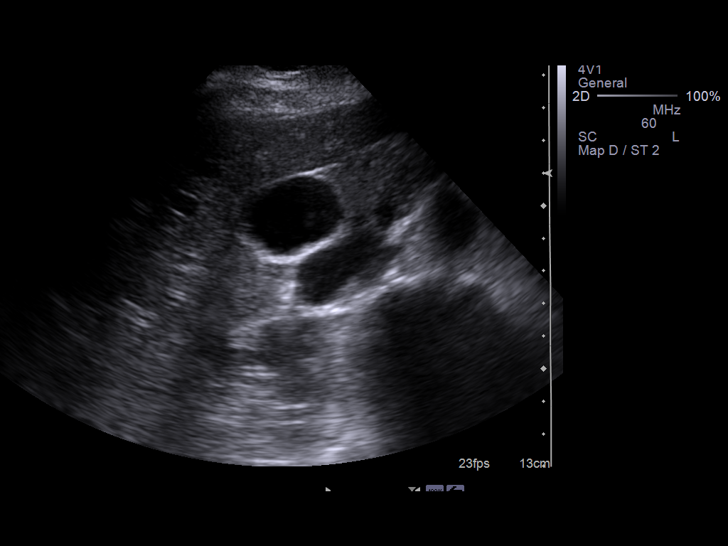
[im 25/46]
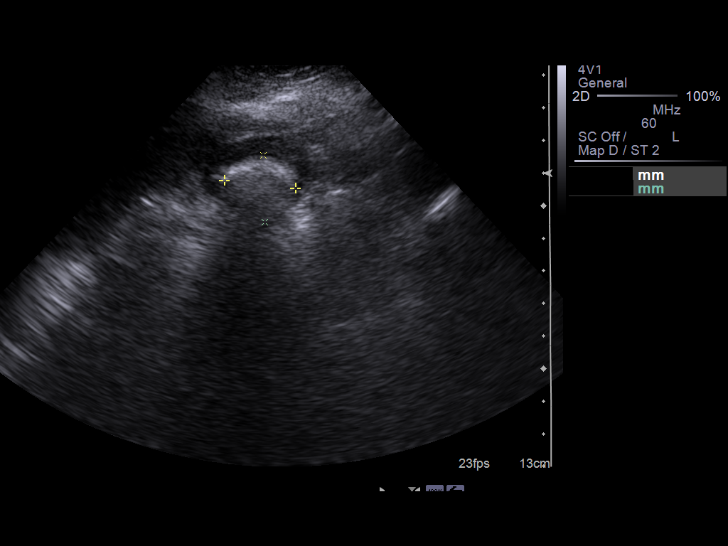
[im 29/46]
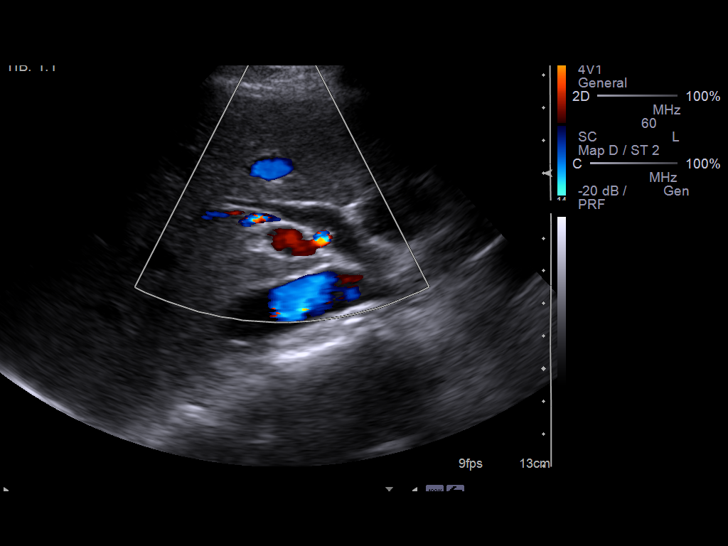
[im 31/46]
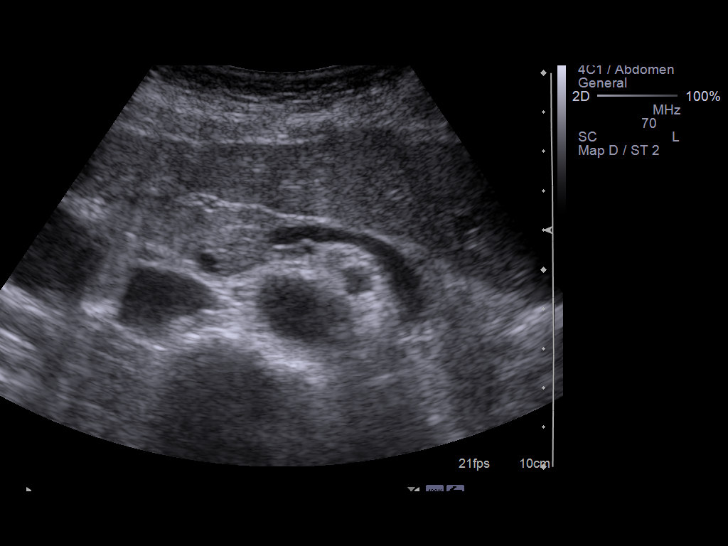
[im 34/46]
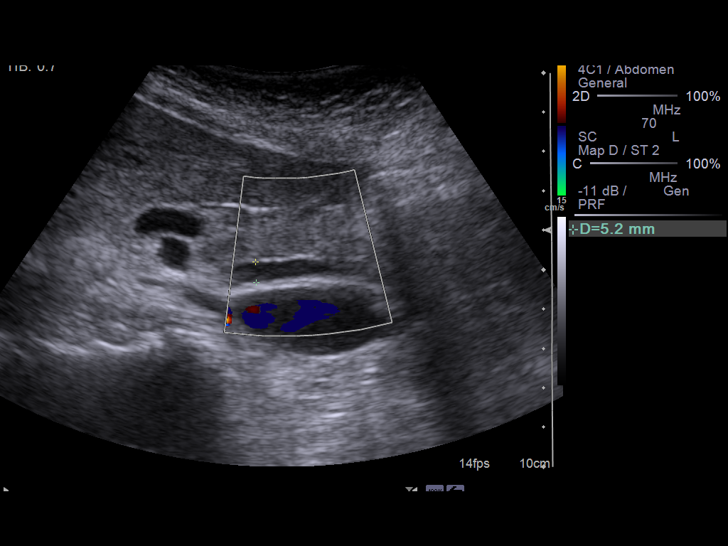
[im 38/46]
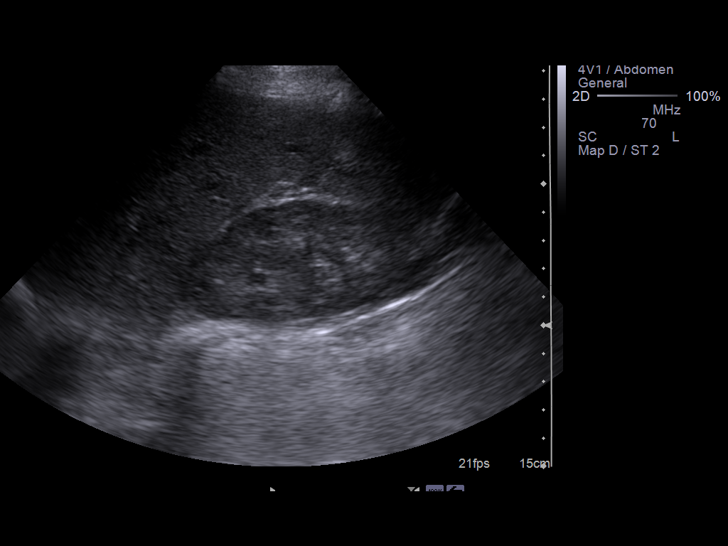
[im 42/46]
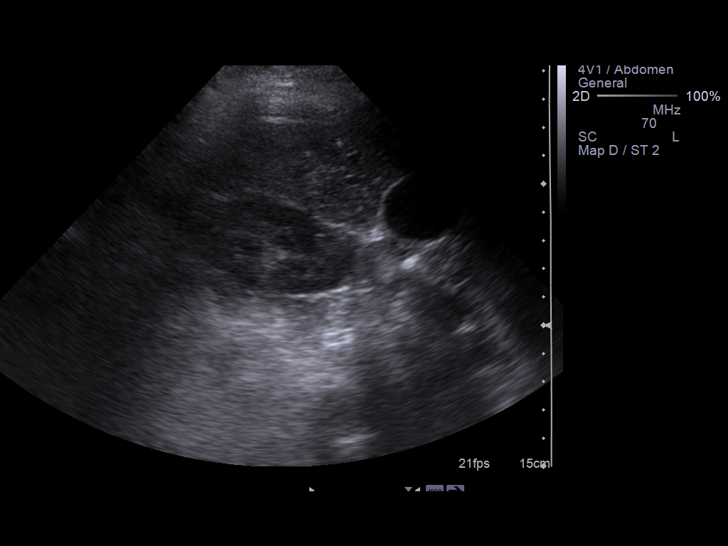
[im 46/46]
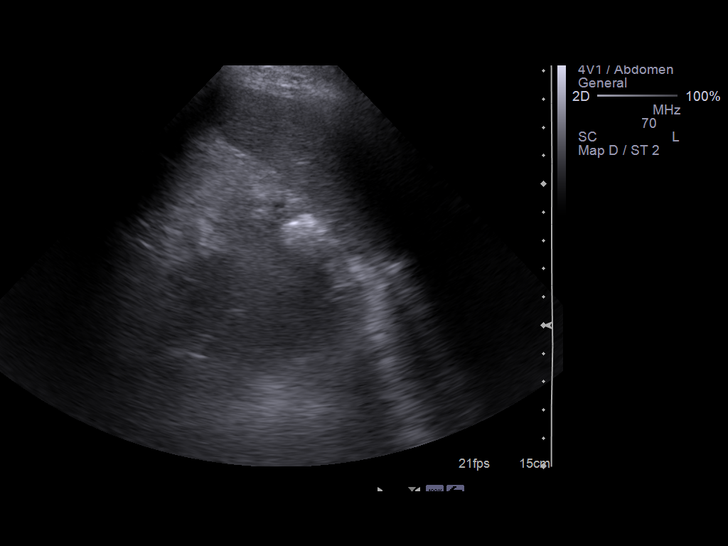

[14 of 25 positions shown; findings below may reference images not displayed]

FINDINGS: Gallbladder:  Solitary gallstone measuring 29 x 21 x 22 mm appears
to be immobile.  Gallbladder wall thickness within normal limits at
102 mm, however, a sonographic Murphy's sign is elicited.  No
pericholecystic fluid.

Common bile duct:  Upper limits of normal at 7 mm in diameter.

Liver:  No focal lesion identified.  Within normal limits in
parenchymal echogenicity.

IVC:  Appears normal.

Pancreas:  No focal abnormality seen.

Spleen:  Normal measuring 10.5 cm in length.

Right Kidney:  No hydronephrosis.  Normal corticomedullary
differentiation cortical echotexture.  Mild cortical thinning.
Length 11.8 cm.

Left Kidney:  Surgically absent.

Abdominal aorta:  No aneurysm identified.
IMPRESSION: 1.  Immobile 29 mm gallstone.  Positive sonographic Murphy's sign
but absent wall thickening.  Findings are suspicious for early
acute cholecystitis. Clinical correlation recommended.
2.  Common bile duct at the upper limits of normal, 7 mm in
diameter.  The fifth

## 2011-09-06 ENCOUNTER — Other Ambulatory Visit (HOSPITAL_COMMUNITY)
Admission: RE | Admit: 2011-09-06 | Discharge: 2011-09-06 | Disposition: A | Discharge status: Home / Self Care | Payer: BC Managed Care – PPO | Source: Ambulatory Visit | Attending: Obstetrics and Gynecology | Admitting: Obstetrics and Gynecology

## 2011-09-06 ENCOUNTER — Other Ambulatory Visit: Payer: Self-pay | Admitting: Nurse Practitioner

## 2011-09-06 DIAGNOSIS — Z1159 Encounter for screening for other viral diseases: Secondary | ICD-10-CM | POA: Insufficient documentation

## 2011-09-06 DIAGNOSIS — Z01419 Encounter for gynecological examination (general) (routine) without abnormal findings: Secondary | ICD-10-CM | POA: Insufficient documentation

## 2012-12-30 ENCOUNTER — Other Ambulatory Visit (HOSPITAL_COMMUNITY)
Admission: RE | Admit: 2012-12-30 | Discharge: 2012-12-30 | Disposition: A | Discharge status: Home / Self Care | Payer: BC Managed Care – PPO | Source: Ambulatory Visit | Attending: Obstetrics and Gynecology | Admitting: Obstetrics and Gynecology

## 2012-12-30 ENCOUNTER — Other Ambulatory Visit: Payer: Self-pay | Admitting: Nurse Practitioner

## 2012-12-30 DIAGNOSIS — Z01419 Encounter for gynecological examination (general) (routine) without abnormal findings: Secondary | ICD-10-CM | POA: Insufficient documentation

## 2013-06-21 ENCOUNTER — Emergency Department (HOSPITAL_COMMUNITY)
Admission: EM | Admit: 2013-06-21 | Discharge: 2013-06-21 | Disposition: A | Discharge status: Home / Self Care | Payer: BC Managed Care – PPO | Attending: Emergency Medicine | Admitting: Emergency Medicine

## 2013-06-21 ENCOUNTER — Encounter (HOSPITAL_COMMUNITY): Payer: Self-pay | Admitting: Emergency Medicine

## 2013-06-21 ENCOUNTER — Emergency Department (HOSPITAL_COMMUNITY): Payer: BC Managed Care – PPO

## 2013-06-21 DIAGNOSIS — J45909 Unspecified asthma, uncomplicated: Secondary | ICD-10-CM | POA: Insufficient documentation

## 2013-06-21 DIAGNOSIS — M542 Cervicalgia: Secondary | ICD-10-CM | POA: Insufficient documentation

## 2013-06-21 DIAGNOSIS — R011 Cardiac murmur, unspecified: Secondary | ICD-10-CM | POA: Insufficient documentation

## 2013-06-21 DIAGNOSIS — M25519 Pain in unspecified shoulder: Secondary | ICD-10-CM | POA: Insufficient documentation

## 2013-06-21 DIAGNOSIS — R51 Headache: Secondary | ICD-10-CM | POA: Insufficient documentation

## 2013-06-21 DIAGNOSIS — R519 Headache, unspecified: Secondary | ICD-10-CM

## 2013-06-21 DIAGNOSIS — R0789 Other chest pain: Secondary | ICD-10-CM | POA: Insufficient documentation

## 2013-06-21 LAB — BASIC METABOLIC PANEL
CO2: 22 mEq/L (ref 19–32)
Glucose, Bld: 92 mg/dL (ref 70–99)
Potassium: 3.7 mEq/L (ref 3.5–5.1)
Sodium: 140 mEq/L (ref 135–145)

## 2013-06-21 LAB — CBC WITH DIFFERENTIAL/PLATELET
HCT: 34.9 % — ABNORMAL LOW (ref 36.0–46.0)
Hemoglobin: 11.5 g/dL — ABNORMAL LOW (ref 12.0–15.0)
Lymphocytes Relative: 37 % (ref 12–46)
Lymphs Abs: 3 10*3/uL (ref 0.7–4.0)
MCHC: 33 g/dL (ref 30.0–36.0)
Monocytes Absolute: 0.7 10*3/uL (ref 0.1–1.0)
Monocytes Relative: 8 % (ref 3–12)
Neutro Abs: 4.3 10*3/uL (ref 1.7–7.7)
WBC: 8.2 10*3/uL (ref 4.0–10.5)

## 2013-06-21 LAB — URINALYSIS, ROUTINE W REFLEX MICROSCOPIC
Glucose, UA: NEGATIVE mg/dL
Ketones, ur: NEGATIVE mg/dL
Leukocytes, UA: NEGATIVE
pH: 6 (ref 5.0–8.0)

## 2013-06-21 LAB — POCT I-STAT, CHEM 8
BUN: 23 mg/dL (ref 6–23)
Chloride: 109 mEq/L (ref 96–112)
Creatinine, Ser: 1.2 mg/dL — ABNORMAL HIGH (ref 0.50–1.10)
Glucose, Bld: 88 mg/dL (ref 70–99)
Potassium: 3.9 mEq/L (ref 3.5–5.1)

## 2013-06-21 MED ORDER — KETOROLAC TROMETHAMINE 30 MG/ML IJ SOLN
30.0000 mg | Freq: Once | INTRAMUSCULAR | Status: AC
  Administered 2013-06-21: 30 mg via INTRAVENOUS
  Filled 2013-06-21: qty 1

## 2013-06-21 MED ORDER — ASPIRIN 81 MG PO CHEW
324.0000 mg | CHEWABLE_TABLET | Freq: Once | ORAL | Status: AC
  Administered 2013-06-21: 324 mg via ORAL
  Filled 2013-06-21: qty 4

## 2013-06-21 MED ORDER — DIPHENHYDRAMINE HCL 50 MG/ML IJ SOLN
25.0000 mg | Freq: Once | INTRAMUSCULAR | Status: AC
  Administered 2013-06-21: 25 mg via INTRAVENOUS
  Filled 2013-06-21: qty 1

## 2013-06-21 MED ORDER — METOCLOPRAMIDE HCL 5 MG/ML IJ SOLN
10.0000 mg | Freq: Once | INTRAMUSCULAR | Status: AC
  Administered 2013-06-21: 10 mg via INTRAVENOUS
  Filled 2013-06-21: qty 2

## 2013-06-21 MED ORDER — SODIUM CHLORIDE 0.9 % IV BOLUS (SEPSIS)
1000.0000 mL | Freq: Once | INTRAVENOUS | Status: AC
  Administered 2013-06-21: 1000 mL via INTRAVENOUS

## 2013-06-21 MED ORDER — METHOCARBAMOL 500 MG PO TABS
750.0000 mg | ORAL_TABLET | Freq: Once | ORAL | Status: AC
  Administered 2013-06-21: 750 mg via ORAL
  Filled 2013-06-21: qty 2

## 2013-06-21 NOTE — ED Notes (Signed)
Pain started this afternoon.  Specific time unknown.  Pt states pain to left side, left arm and left neck.  No trauma noted.  Pt states no prior symptoms to pain beginning.  Pt states no past medical hx of same.

## 2013-06-21 NOTE — ED Notes (Signed)
MD at bedside. 

## 2013-06-21 NOTE — ED Provider Notes (Signed)
CSN: [id number]     Arrival date & time 06/21/13  0504 History   First MD Initiated Contact with Patient 06/21/13 [phone]     Chief Complaint  Patient presents with  . Chest Pain   (Consider location/radiation/quality/duration/timing/severity/associated sxs/prior Treatment) HPI 62 year old female presents to the emergency department from home with complaint of headache starting yesterday morning, followed by chest pain, left neck pain, and left shoulder pain, starting in the evening.  She is unsure exactly when the pain started.  Pain has been constant since onset.  She reports her headache is similar to prior headaches, but usually she is able to go to sleep, and the headache resolves.  She does not normally have chest or neck pain.  She denies any known trauma or overuse injury.  Pain is an aching feeling.  She denies any weakness or numbness.  No vision changes.  No nausea or vomiting, no shortness of breath, no diaphoresis.  She reports her father had a valve replacement and died of an MI later in life.  She reports she recently had a physical done with her primary care doctor, which was normal.  Past Medical History  Diagnosis Date  . Asthma   . Heart murmur    Past Surgical History  Procedure Laterality Date  . Cholecystectomy    . Tonsillectomy    . Nephrectomy    . Tendon repair    . Upper gastrointestinal endoscopy    . Ercp  10/2009    normal - abnormal IOC   Family History  Problem Relation Age of Onset  . Colon cancer Mother    History  Substance Use Topics  . Smoking status: Never Smoker   . Smokeless tobacco: Not on file  . Alcohol Use: 0.6 oz/week    1 Glasses of wine per week     Comment: night   OB History   Grav Para Term Preterm Abortions TAB SAB Ect Mult Living                 Review of Systems  See History of Present Illness; otherwise all other systems are reviewed and negative  Allergies  Review of patient's allergies indicates no known  allergies.  Home Medications  No current outpatient prescriptions on file. BP 143/72  Pulse 69  Temp(Src) 97.8 F (36.6 C) (Oral)  Resp 18  SpO2 100% Physical Exam  Nursing note and vitals reviewed. Constitutional: She is oriented to person, place, and time. She appears well-developed and well-nourished. No distress.  HENT:  Head: Normocephalic and atraumatic.  Right Ear: External ear normal.  Left Ear: External ear normal.  Nose: Nose normal.  Mouth/Throat: Oropharynx is clear and moist.  Eyes: Conjunctivae and EOM are normal. Pupils are equal, round, and reactive to light.  Neck: Normal range of motion. Neck supple. No JVD present. No tracheal deviation present. No thyromegaly present.  Patient has tenderness to left sternocleidomastoid muscle, as well as left trapezius.  No crepitus, no step-off no overlying skin changes  Cardiovascular: Normal rate, regular rhythm, normal heart sounds and intact distal pulses.  Exam reveals no gallop and no friction rub.   No murmur heard. Pulmonary/Chest: Effort normal and breath sounds normal. No stridor. No respiratory distress. She has no wheezes. She has no rales. She exhibits tenderness (patient is tender to palpation along her sternum).  Abdominal: Soft. Bowel sounds are normal. She exhibits no distension and no mass. There is no tenderness. There is no rebound and no guarding.  Musculoskeletal: Normal range of motion. She exhibits no edema and no tenderness.  Lymphadenopathy:    She has no cervical adenopathy.  Neurological: She is alert and oriented to person, place, and time. She has normal reflexes. No cranial nerve deficit. She exhibits normal muscle tone. Coordination normal.  Skin: Skin is dry. No rash noted. No erythema. No pallor.  Psychiatric: She has a normal mood and affect. Her behavior is normal. Judgment and thought content normal.    ED Course  Procedures (including critical care time) Labs Review Labs Reviewed  CBC  WITH DIFFERENTIAL - Abnormal; Notable for the following:    RBC 3.78 (*)    Hemoglobin 11.5 (*)    HCT 34.9 (*)    All other components within normal limits  BASIC METABOLIC PANEL - Abnormal; Notable for the following:    GFR calc non Af Amer 56 (*)    GFR calc Af Amer 65 (*)    All other components within normal limits  POCT I-STAT, CHEM 8 - Abnormal; Notable for the following:    Creatinine, Ser 1.20 (*)    Calcium, Ion 1.09 (*)    All other components within normal limits  APTT  URINALYSIS, ROUTINE W REFLEX MICROSCOPIC  POCT I-STAT TROPONIN I   Imaging Review Dg Chest 2 View  06/21/2013   CLINICAL DATA:  Chest pain and shortness of breath. History of asthma.  EXAM: CHEST  2 VIEW  COMPARISON:  CT of the chest performed 03/17/2008, and chest radiograph performed 06/26/2004  FINDINGS: The lungs are well-aerated and clear. There is no evidence of focal opacification, pleural effusion or pneumothorax.  The heart is borderline enlarged. No acute osseous abnormalities are seen.  IMPRESSION: Borderline cardiomegaly; no acute cardiopulmonary process seen.   Electronically Signed   By: Roanna Raider M.D.   On: 06/21/2013 06:08    EKG Interpretation     Ventricular Rate:  82 PR Interval:  132 QRS Duration: 90 QT Interval:  371 QTC Calculation: 433 R Axis:   62 Text Interpretation:  Sinus rhythm No significant change since last tracing            MDM   1. Headache   2. Neck pain on left side   3. Chest pain, atypical    62 year old female with 24 hours of headache, followed by roughly 12 hours of chest pain, neck pain, arm pain on the left.  EKG without ST elevation or ischemic changes.  We'll plan for labs, aspirin, pain medication and see if we can relieve her pain and evaluate for possible ischemia.  Given length of time of chest pain.  I feel that 1 troponin, should be sensitive to indicate any cardiac ischemia.  Patient's neurosonogram is normal, I do not feel that she  has a serious intercranial problem    Olivia Mackie, MD 06/21/13 [phone]

## 2013-06-21 NOTE — ED Notes (Signed)
Patient transported to X-ray 

## 2014-08-20 ENCOUNTER — Encounter (HOSPITAL_COMMUNITY): Payer: Self-pay | Admitting: Pharmacy Technician

## 2014-08-24 ENCOUNTER — Encounter (HOSPITAL_COMMUNITY): Payer: Self-pay

## 2014-08-24 ENCOUNTER — Encounter (HOSPITAL_COMMUNITY)
Admission: RE | Admit: 2014-08-24 | Discharge: 2014-08-24 | Disposition: A | Discharge status: Home / Self Care | Payer: BLUE CROSS/BLUE SHIELD | Source: Ambulatory Visit | Attending: Orthopedic Surgery | Admitting: Orthopedic Surgery

## 2014-08-24 DIAGNOSIS — M19012 Primary osteoarthritis, left shoulder: Secondary | ICD-10-CM | POA: Insufficient documentation

## 2014-08-24 DIAGNOSIS — Z01812 Encounter for preprocedural laboratory examination: Secondary | ICD-10-CM | POA: Insufficient documentation

## 2014-08-24 HISTORY — DX: Tachycardia, unspecified: R00.0

## 2014-08-24 HISTORY — DX: Chronic kidney disease, unspecified: N18.9

## 2014-08-24 HISTORY — DX: Other complications of anesthesia, initial encounter: T88.59XA

## 2014-08-24 HISTORY — DX: Nausea with vomiting, unspecified: R11.2

## 2014-08-24 HISTORY — DX: Adverse effect of unspecified anesthetic, initial encounter: T41.45XA

## 2014-08-24 HISTORY — DX: Anxiety disorder, unspecified: F41.9

## 2014-08-24 HISTORY — DX: Headache, unspecified: R51.9

## 2014-08-24 HISTORY — DX: Other specified postprocedural states: Z98.890

## 2014-08-24 HISTORY — DX: Unspecified osteoarthritis, unspecified site: M19.90

## 2014-08-24 HISTORY — DX: Headache: R51

## 2014-08-24 LAB — CBC
HEMATOCRIT: 38.6 % (ref 36.0–46.0)
Hemoglobin: 12.5 g/dL (ref 12.0–15.0)
MCH: 29.6 pg (ref 26.0–34.0)
MCHC: 32.4 g/dL (ref 30.0–36.0)
MCV: 91.5 fL (ref 78.0–100.0)
PLATELETS: 278 10*3/uL (ref 150–400)
RBC: 4.22 MIL/uL (ref 3.87–5.11)
RDW: 14.3 % (ref 11.5–15.5)
WBC: 9.2 10*3/uL (ref 4.0–10.5)

## 2014-08-24 LAB — BASIC METABOLIC PANEL
ANION GAP: 7 (ref 5–15)
BUN: 12 mg/dL (ref 6–23)
CALCIUM: 9.4 mg/dL (ref 8.4–10.5)
CO2: 27 mmol/L (ref 19–32)
Chloride: 105 mEq/L (ref 96–112)
Creatinine, Ser: 1.01 mg/dL (ref 0.50–1.10)
GFR calc Af Amer: 68 mL/min — ABNORMAL LOW (ref >90)
GFR calc non Af Amer: 58 mL/min — ABNORMAL LOW (ref >90)
Glucose, Bld: 82 mg/dL (ref 70–99)
Potassium: 4.2 mmol/L (ref 3.5–5.1)
SODIUM: 139 mmol/L (ref 135–145)

## 2014-08-24 LAB — SURGICAL PCR SCREEN
MRSA, PCR: NEGATIVE
Staphylococcus aureus: NEGATIVE

## 2014-08-24 NOTE — H&P (Signed)
  [name] is an 64 y.o. female.    Chief Complaint: left shoulder pain and weakness  HPI: Pt is a 64 y.o. female complaining of left shoulder pain for multiple years. Pain had continually increased since the beginning. X-rays in the clinic show end-stage arthritic changes of the left shoulder. Pt has tried various conservative treatments which have failed to alleviate their symptoms, including injections and therapy. Various options are discussed with the patient. Risks, benefits and expectations were discussed with the patient. Patient understand the risks, benefits and expectations and wishes to proceed with surgery.   PCP:  No primary care provider on file.  D/C Plans:  Home   PMH: Past Medical History  Diagnosis Date  . Asthma   . Heart murmur     PSH: Past Surgical History  Procedure Laterality Date  . Cholecystectomy    . Tonsillectomy    . Nephrectomy    . Tendon repair    . Upper gastrointestinal endoscopy    . Ercp  10/2009    normal - abnormal IOC    Social History:  reports that she has never smoked. She does not have any smokeless tobacco history on file. She reports that she drinks about 0.6 oz of alcohol per week. She reports that she does not use illicit drugs.  Allergies:  No Known Allergies  Medications: No current facility-administered medications for this encounter.   Current Outpatient Prescriptions  Medication Sig Dispense Refill  . Multiple Vitamins-Minerals (MULTIVITAMIN PO) Take 1 tablet by mouth daily.    . Vitamin D, Cholecalciferol, 1000 UNITS TABS Take 2,000 Units by mouth daily.    Marland Kitchen zolpidem (AMBIEN) 10 MG tablet Take 5 mg by mouth at bedtime as needed for sleep.      No results found for this or any previous visit (from the past 48 hour(s)). No results found.  ROS: Pain with rom of the left upper extremity  Physical Exam:  Alert and oriented 64 y.o. female in no acute distress Cranial nerves 2-12 intact Cervical spine: full  rom with no tenderness, nv intact distally Chest: active breath sounds bilaterally, no wheeze rhonchi or rales Heart: regular rate and rhythm, no murmur Abd: non tender non distended with active bowel sounds Hip is stable with rom  Left shoulder with decreased rom and weakness due to cuff insufficiency  nv intact distally No rashes or edema  Assessment/Plan Assessment: left shoulder rotator cuff insufficiency   Plan: Patient will undergo a left reverse total shoulder by Dr. Veverly Fells at [facility] Risks benefits and expectations were discussed with the patient. Patient understand risks, benefits and expectations and wishes to proceed.

## 2014-08-24 NOTE — Pre-Procedure Instructions (Signed)
[name]  08/24/2014   Your procedure is scheduled on:  09/03/2014  Report to [facility] Admitting at 9:00 AM.  Call this number if you have problems the morning of surgery: [phone]   Remember:   Do not eat food or drink liquids after midnight.  On Thursday   Take these medicines the morning of surgery with A SIP OF WATER: NONE   Do not wear jewelry, make-up or nail polish.   Do not wear lotions, powders, or perfumes. You may NOT wear deodorant.   Do not shave 48 hours prior to surgery.   Do not bring valuables to the hospital.  [facility] is not responsible                  for any belongings or valuables.               Contacts, dentures or bridgework may not be worn into surgery.   Leave suitcase in the car. After surgery it may be brought to your room.   For patients admitted to the hospital, discharge time is determined by your  treatment team.               Patients discharged the day of surgery will not be allowed to drive  home.  Name and phone number of your driver: with family  Special Instructions: Special Instructions: [facility] - Preparing for Surgery  Before surgery, you can play an important role.  Because skin is not sterile, your skin needs to be as free of germs as possible.  You can reduce the number of germs on you skin by washing with CHG (chlorahexidine gluconate) soap before surgery.  CHG is an antiseptic cleaner which kills germs and bonds with the skin to continue killing germs even after washing.  Please DO NOT use if you have an allergy to CHG or antibacterial soaps.  If your skin becomes reddened/irritated stop using the CHG and inform your nurse when you arrive at Short Stay.  Do not shave (including legs and underarms) for at least 48 hours prior to the first CHG shower.  You may shave your face.  Please follow these instructions carefully:   1.  Shower with CHG Soap the night before surgery and the  morning of  Surgery.  2.  If you choose to wash your hair, wash your hair first as usual with your  normal shampoo.  3.  After you shampoo, rinse your hair and body thoroughly to remove the  Shampoo.  4.  Use CHG as you would any other liquid soap.  You can apply chg directly to the skin and wash gently with scrungie or a clean washcloth.  5.  Apply the CHG Soap to your body ONLY FROM THE NECK DOWN.    Do not use on open wounds or open sores.  Avoid contact with your eyes, ears, mouth and genitals (private parts).  Wash genitals (private parts)   with your normal soap.  6.  Wash thoroughly, paying special attention to the area where your surgery will be performed.  7.  Thoroughly rinse your body with warm water from the neck down.  8.  DO NOT shower/wash with your normal soap after using and rinsing off   the CHG Soap.  9.  Pat yourself dry with a clean towel.            10.  Wear clean pajamas.  11.  Place clean sheets on your bed the night of your first shower and do not sleep with pets.  Day of Surgery  Do not apply any lotions/deodorants the morning of surgery.  Please wear clean clothes to the hospital/surgery center.   Please read over the following fact sheets that you were given: Pain Booklet, Coughing and Deep Breathing, MRSA Information and Surgical Site Infection Prevention

## 2014-09-02 MED ORDER — CEFAZOLIN SODIUM-DEXTROSE 2-3 GM-% IV SOLR
2.0000 g | INTRAVENOUS | Status: AC
  Administered 2014-09-03: 2 g via INTRAVENOUS
  Filled 2014-09-02: qty 50

## 2014-09-03 ENCOUNTER — Inpatient Hospital Stay (HOSPITAL_COMMUNITY): Payer: BLUE CROSS/BLUE SHIELD | Admitting: Anesthesiology

## 2014-09-03 ENCOUNTER — Inpatient Hospital Stay (HOSPITAL_COMMUNITY)
Admission: RE | Admit: 2014-09-03 | Discharge: 2014-09-04 | DRG: 483 | Disposition: A | Discharge status: Home / Self Care | Payer: BLUE CROSS/BLUE SHIELD | Source: Ambulatory Visit | Attending: Orthopedic Surgery | Admitting: Orthopedic Surgery

## 2014-09-03 ENCOUNTER — Inpatient Hospital Stay (HOSPITAL_COMMUNITY): Payer: BLUE CROSS/BLUE SHIELD

## 2014-09-03 ENCOUNTER — Encounter (HOSPITAL_COMMUNITY)
Admission: RE | Disposition: A | Discharge status: Home / Self Care | Payer: Self-pay | Source: Ambulatory Visit | Attending: Orthopedic Surgery

## 2014-09-03 ENCOUNTER — Encounter (HOSPITAL_COMMUNITY): Payer: Self-pay | Admitting: Anesthesiology

## 2014-09-03 DIAGNOSIS — M19012 Primary osteoarthritis, left shoulder: Secondary | ICD-10-CM | POA: Diagnosis present

## 2014-09-03 DIAGNOSIS — Z79899 Other long term (current) drug therapy: Secondary | ICD-10-CM

## 2014-09-03 DIAGNOSIS — M25512 Pain in left shoulder: Secondary | ICD-10-CM | POA: Diagnosis present

## 2014-09-03 DIAGNOSIS — Z96612 Presence of left artificial shoulder joint: Secondary | ICD-10-CM

## 2014-09-03 DIAGNOSIS — J45909 Unspecified asthma, uncomplicated: Secondary | ICD-10-CM | POA: Diagnosis present

## 2014-09-03 DIAGNOSIS — Z96619 Presence of unspecified artificial shoulder joint: Secondary | ICD-10-CM

## 2014-09-03 HISTORY — PX: REVERSE SHOULDER ARTHROPLASTY: SHX5054

## 2014-09-03 SURGERY — ARTHROPLASTY, SHOULDER, TOTAL, REVERSE
Anesthesia: Regional | Site: Shoulder | Laterality: Left

## 2014-09-03 MED ORDER — FENTANYL CITRATE 0.05 MG/ML IJ SOLN
INTRAMUSCULAR | Status: AC
  Filled 2014-09-03: qty 2

## 2014-09-03 MED ORDER — SODIUM CHLORIDE 0.9 % IR SOLN: Status: DC | PRN

## 2014-09-03 MED ORDER — METHOCARBAMOL 500 MG PO TABS
500.0000 mg | ORAL_TABLET | Freq: Four times a day (QID) | ORAL | Status: DC | PRN
  Administered 2014-09-03 – 2014-09-04 (×3): 500 mg via ORAL
  Filled 2014-09-03 (×4): qty 1

## 2014-09-03 MED ORDER — ONDANSETRON HCL 4 MG/2ML IJ SOLN: 4.0000 mg | Freq: Four times a day (QID) | INTRAMUSCULAR | Status: DC | PRN

## 2014-09-03 MED ORDER — SODIUM CHLORIDE 0.9 % IV SOLN
INTRAVENOUS | Status: DC | PRN
  Administered 2014-09-03: 12:00:00 via INTRAVENOUS

## 2014-09-03 MED ORDER — BUPIVACAINE-EPINEPHRINE (PF) 0.25% -1:200000 IJ SOLN
INTRAMUSCULAR | Status: AC
  Filled 2014-09-03: qty 30

## 2014-09-03 MED ORDER — LACTATED RINGERS IV SOLN
INTRAVENOUS | Status: DC | PRN
  Administered 2014-09-03 (×2): via INTRAVENOUS

## 2014-09-03 MED ORDER — ARTIFICIAL TEARS OP OINT
TOPICAL_OINTMENT | OPHTHALMIC | Status: AC
  Filled 2014-09-03: qty 3.5

## 2014-09-03 MED ORDER — HYDROMORPHONE HCL 1 MG/ML IJ SOLN: 0.2500 mg | INTRAMUSCULAR | Status: DC | PRN

## 2014-09-03 MED ORDER — POLYETHYLENE GLYCOL 3350 17 G PO PACK: 17.0000 g | PACK | Freq: Every day | ORAL | Status: DC | PRN

## 2014-09-03 MED ORDER — CHLORHEXIDINE GLUCONATE 4 % EX LIQD
60.0000 mL | Freq: Once | CUTANEOUS | Status: DC
  Filled 2014-09-03: qty 60

## 2014-09-03 MED ORDER — BUPIVACAINE-EPINEPHRINE 0.25% -1:200000 IJ SOLN
INTRAMUSCULAR | Status: DC | PRN
  Administered 2014-09-03: 10 mL

## 2014-09-03 MED ORDER — ARTIFICIAL TEARS OP OINT
TOPICAL_OINTMENT | OPHTHALMIC | Status: DC | PRN
  Administered 2014-09-03: 1 via OPHTHALMIC

## 2014-09-03 MED ORDER — METOCLOPRAMIDE HCL 10 MG PO TABS: 5.0000 mg | ORAL_TABLET | Freq: Three times a day (TID) | ORAL | Status: DC | PRN

## 2014-09-03 MED ORDER — METHOCARBAMOL 1000 MG/10ML IJ SOLN
500.0000 mg | Freq: Four times a day (QID) | INTRAVENOUS | Status: DC | PRN
  Filled 2014-09-03: qty 5

## 2014-09-03 MED ORDER — CALCIUM CARBONATE ANTACID 500 MG PO CHEW
1.0000 | CHEWABLE_TABLET | Freq: Every day | ORAL | Status: DC
  Filled 2014-09-03 (×2): qty 1

## 2014-09-03 MED ORDER — ROCURONIUM BROMIDE 50 MG/5ML IV SOLN
INTRAVENOUS | Status: AC
  Filled 2014-09-03: qty 1

## 2014-09-03 MED ORDER — MIDAZOLAM HCL 2 MG/2ML IJ SOLN
INTRAMUSCULAR | Status: AC
  Filled 2014-09-03: qty 2

## 2014-09-03 MED ORDER — ACETAMINOPHEN 325 MG PO TABS
650.0000 mg | ORAL_TABLET | Freq: Four times a day (QID) | ORAL | Status: DC | PRN
  Administered 2014-09-03 – 2014-09-04 (×3): 650 mg via ORAL
  Filled 2014-09-03 (×4): qty 2

## 2014-09-03 MED ORDER — LIDOCAINE HCL (CARDIAC) 20 MG/ML IV SOLN
INTRAVENOUS | Status: DC | PRN
  Administered 2014-09-03: 80 mg via INTRAVENOUS

## 2014-09-03 MED ORDER — CEFAZOLIN SODIUM-DEXTROSE 2-3 GM-% IV SOLR
2.0000 g | Freq: Four times a day (QID) | INTRAVENOUS | Status: AC
  Administered 2014-09-03 – 2014-09-04 (×3): 2 g via INTRAVENOUS
  Filled 2014-09-03 (×3): qty 50

## 2014-09-03 MED ORDER — GLYCOPYRROLATE 0.2 MG/ML IJ SOLN
INTRAMUSCULAR | Status: DC | PRN
  Administered 2014-09-03: 0.2 mg via INTRAVENOUS
  Administered 2014-09-03: .6 mg via INTRAVENOUS

## 2014-09-03 MED ORDER — SODIUM CHLORIDE 0.9 % IV SOLN: INTRAVENOUS | Status: DC | PRN

## 2014-09-03 MED ORDER — ONDANSETRON HCL 4 MG/2ML IJ SOLN
INTRAMUSCULAR | Status: DC | PRN
  Administered 2014-09-03: 4 mg via INTRAVENOUS

## 2014-09-03 MED ORDER — 0.9 % SODIUM CHLORIDE (POUR BTL) OPTIME
TOPICAL | Status: DC | PRN
  Administered 2014-09-03: 1000 mL

## 2014-09-03 MED ORDER — HYDROMORPHONE HCL 1 MG/ML IJ SOLN: 0.5000 mg | INTRAMUSCULAR | Status: DC | PRN

## 2014-09-03 MED ORDER — NEOSTIGMINE METHYLSULFATE 10 MG/10ML IV SOLN
INTRAVENOUS | Status: DC | PRN
  Administered 2014-09-03: 3 mg via INTRAVENOUS

## 2014-09-03 MED ORDER — METOCLOPRAMIDE HCL 5 MG/ML IJ SOLN: 5.0000 mg | Freq: Three times a day (TID) | INTRAMUSCULAR | Status: DC | PRN

## 2014-09-03 MED ORDER — ROCURONIUM BROMIDE 100 MG/10ML IV SOLN
INTRAVENOUS | Status: DC | PRN
  Administered 2014-09-03: 50 mg via INTRAVENOUS

## 2014-09-03 MED ORDER — OXYCODONE-ACETAMINOPHEN 5-325 MG PO TABS: 1.0000 | ORAL_TABLET | ORAL | Status: DC | PRN

## 2014-09-03 MED ORDER — LACTATED RINGERS IV SOLN
INTRAVENOUS | Status: DC
Start: 2014-09-03 — End: 2014-09-03
  Administered 2014-09-03: 10:00:00 via INTRAVENOUS

## 2014-09-03 MED ORDER — ONDANSETRON HCL 4 MG/2ML IJ SOLN
INTRAMUSCULAR | Status: AC
  Filled 2014-09-03: qty 2

## 2014-09-03 MED ORDER — MIDAZOLAM HCL 2 MG/2ML IJ SOLN
1.0000 mg | INTRAMUSCULAR | Status: DC | PRN
  Administered 2014-09-03: 2 mg via INTRAVENOUS

## 2014-09-03 MED ORDER — PROPOFOL 10 MG/ML IV BOLUS
INTRAVENOUS | Status: AC
  Filled 2014-09-03: qty 20

## 2014-09-03 MED ORDER — BUPIVACAINE-EPINEPHRINE (PF) 0.5% -1:200000 IJ SOLN
INTRAMUSCULAR | Status: DC | PRN
  Administered 2014-09-03: 30 mL via PERINEURAL

## 2014-09-03 MED ORDER — METHOCARBAMOL 500 MG PO TABS: 500.0000 mg | ORAL_TABLET | Freq: Three times a day (TID) | ORAL | Status: DC | PRN

## 2014-09-03 MED ORDER — PNEUMOCOCCAL VAC POLYVALENT 25 MCG/0.5ML IJ INJ
0.5000 mL | INJECTION | INTRAMUSCULAR | Status: AC
  Administered 2014-09-04: 0.5 mL via INTRAMUSCULAR

## 2014-09-03 MED ORDER — ONDANSETRON HCL 4 MG PO TABS: 4.0000 mg | ORAL_TABLET | Freq: Four times a day (QID) | ORAL | Status: DC | PRN

## 2014-09-03 MED ORDER — FENTANYL CITRATE 0.05 MG/ML IJ SOLN
INTRAMUSCULAR | Status: AC
  Filled 2014-09-03: qty 5

## 2014-09-03 MED ORDER — LIDOCAINE HCL (CARDIAC) 20 MG/ML IV SOLN
INTRAVENOUS | Status: AC
  Filled 2014-09-03: qty 5

## 2014-09-03 MED ORDER — PHENYLEPHRINE HCL 10 MG/ML IJ SOLN
10.0000 mg | INTRAMUSCULAR | Status: DC | PRN
  Administered 2014-09-03: 25 ug/min via INTRAVENOUS

## 2014-09-03 MED ORDER — ACETAMINOPHEN 650 MG RE SUPP: 650.0000 mg | Freq: Four times a day (QID) | RECTAL | Status: DC | PRN

## 2014-09-03 MED ORDER — DEXAMETHASONE SODIUM PHOSPHATE 10 MG/ML IJ SOLN
INTRAMUSCULAR | Status: DC | PRN
  Administered 2014-09-03: 10 mg via INTRAVENOUS

## 2014-09-03 MED ORDER — OXYCODONE HCL 5 MG/5ML PO SOLN: 5.0000 mg | Freq: Once | ORAL | Status: DC | PRN

## 2014-09-03 MED ORDER — FENTANYL CITRATE 0.05 MG/ML IJ SOLN
50.0000 ug | INTRAMUSCULAR | Status: DC | PRN
  Administered 2014-09-03: 100 ug via INTRAVENOUS

## 2014-09-03 MED ORDER — VITAMIN D (CHOLECALCIFEROL) 25 MCG (1000 UT) PO TABS: 2000.0000 [IU] | ORAL_TABLET | Freq: Every day | ORAL | Status: DC

## 2014-09-03 MED ORDER — VITAMIN D3 25 MCG (1000 UNIT) PO TABS
2000.0000 [IU] | ORAL_TABLET | Freq: Every day | ORAL | Status: DC
  Administered 2014-09-04: 2000 [IU] via ORAL
  Filled 2014-09-03 (×2): qty 2

## 2014-09-03 MED ORDER — SODIUM CHLORIDE 0.9 % IJ SOLN
INTRAMUSCULAR | Status: AC
  Filled 2014-09-03: qty 10

## 2014-09-03 MED ORDER — EPHEDRINE SULFATE 50 MG/ML IJ SOLN
INTRAMUSCULAR | Status: AC
  Filled 2014-09-03: qty 1

## 2014-09-03 MED ORDER — OXYCODONE HCL 5 MG PO TABS: 5.0000 mg | ORAL_TABLET | Freq: Once | ORAL | Status: DC | PRN

## 2014-09-03 MED ORDER — FENTANYL CITRATE 0.05 MG/ML IJ SOLN
INTRAMUSCULAR | Status: DC | PRN
  Administered 2014-09-03: 50 ug via INTRAVENOUS

## 2014-09-03 MED ORDER — PHENOL 1.4 % MT LIQD: 1.0000 | OROMUCOSAL | Status: DC | PRN

## 2014-09-03 MED ORDER — MENTHOL 3 MG MT LOZG
1.0000 | LOZENGE | OROMUCOSAL | Status: DC | PRN
  Administered 2014-09-03: 1 via ORAL
  Filled 2014-09-03: qty 9

## 2014-09-03 MED ORDER — ZOLPIDEM TARTRATE 5 MG PO TABS: 5.0000 mg | ORAL_TABLET | Freq: Every evening | ORAL | Status: DC | PRN

## 2014-09-03 MED ORDER — THROMBIN 5000 UNITS EX SOLR
CUTANEOUS | Status: AC
  Filled 2014-09-03: qty 5000

## 2014-09-03 MED ORDER — SODIUM CHLORIDE 0.9 % IV SOLN
INTRAVENOUS | Status: DC
  Administered 2014-09-04: 01:00:00 via INTRAVENOUS

## 2014-09-03 MED ORDER — PROPOFOL 10 MG/ML IV BOLUS
INTRAVENOUS | Status: DC | PRN
  Administered 2014-09-03: 150 mg via INTRAVENOUS

## 2014-09-03 SURGICAL SUPPLY — 63 items
BIT DRILL 170X2.5X (BIT) IMPLANT
BIT DRL 170X2.5X (BIT)
BLADE SAG 18X100X1.27 (BLADE) ×2 IMPLANT
CAPT SHLDR REVTOTAL 1 ×2 IMPLANT
COVER SURGICAL LIGHT HANDLE (MISCELLANEOUS) ×2 IMPLANT
DRAPE INCISE IOBAN 66X45 STRL (DRAPES) ×4 IMPLANT
DRAPE U-SHAPE 47X51 STRL (DRAPES) ×2 IMPLANT
DRAPE X-RAY CASS 24X20 (DRAPES) IMPLANT
DRILL 2.5 (BIT)
DRSG ADAPTIC 3X8 NADH LF (GAUZE/BANDAGES/DRESSINGS) ×2 IMPLANT
DRSG PAD ABDOMINAL 8X10 ST (GAUZE/BANDAGES/DRESSINGS) ×4 IMPLANT
DURAPREP 26ML APPLICATOR (WOUND CARE) ×2 IMPLANT
ELECT BLADE 4.0 EZ CLEAN MEGAD (MISCELLANEOUS) ×2
ELECT NEEDLE TIP 2.8 STRL (NEEDLE) ×2 IMPLANT
ELECT REM PT RETURN 9FT ADLT (ELECTROSURGICAL) ×2
ELECTRODE BLDE 4.0 EZ CLN MEGD (MISCELLANEOUS) ×1 IMPLANT
ELECTRODE REM PT RTRN 9FT ADLT (ELECTROSURGICAL) ×1 IMPLANT
GAUZE SPONGE 4X4 12PLY STRL (GAUZE/BANDAGES/DRESSINGS) ×2 IMPLANT
GLOVE BIOGEL PI ORTHO PRO 7.5 (GLOVE) ×1
GLOVE BIOGEL PI ORTHO PRO SZ8 (GLOVE) ×1
GLOVE ORTHO TXT STRL SZ7.5 (GLOVE) ×2 IMPLANT
GLOVE PI ORTHO PRO STRL 7.5 (GLOVE) ×1 IMPLANT
GLOVE PI ORTHO PRO STRL SZ8 (GLOVE) ×1 IMPLANT
GLOVE SURG ORTHO 8.5 STRL (GLOVE) ×2 IMPLANT
GOWN STRL REUS W/ TWL LRG LVL3 (GOWN DISPOSABLE) ×1 IMPLANT
GOWN STRL REUS W/ TWL XL LVL3 (GOWN DISPOSABLE) ×2 IMPLANT
GOWN STRL REUS W/TWL LRG LVL3 (GOWN DISPOSABLE) ×1
GOWN STRL REUS W/TWL XL LVL3 (GOWN DISPOSABLE) ×2
HANDPIECE INTERPULSE COAX TIP (DISPOSABLE)
KIT BASIN OR (CUSTOM PROCEDURE TRAY) ×2 IMPLANT
KIT ROOM TURNOVER OR (KITS) ×2 IMPLANT
MANIFOLD NEPTUNE II (INSTRUMENTS) ×2 IMPLANT
NEEDLE 1/2 CIR MAYO (NEEDLE) ×2 IMPLANT
NEEDLE HYPO 25GX1X1/2 BEV (NEEDLE) ×2 IMPLANT
NS IRRIG 1000ML POUR BTL (IV SOLUTION) ×2 IMPLANT
PACK SHOULDER (CUSTOM PROCEDURE TRAY) ×2 IMPLANT
PAD ARMBOARD 7.5X6 YLW CONV (MISCELLANEOUS) ×4 IMPLANT
PIN GUIDE 1.2 (PIN) IMPLANT
PIN GUIDE GLENOPHERE 1.5MX300M (PIN) IMPLANT
PIN METAGLENE 2.5 (PIN) IMPLANT
SET HNDPC FAN SPRY TIP SCT (DISPOSABLE) IMPLANT
SLING ARM IMMOBILIZER LRG (SOFTGOODS) ×2 IMPLANT
SLING ARM LRG ADULT FOAM STRAP (SOFTGOODS) ×2 IMPLANT
SPONGE GAUZE 4X4 12PLY STER LF (GAUZE/BANDAGES/DRESSINGS) ×2 IMPLANT
SPONGE LAP 18X18 X RAY DECT (DISPOSABLE) ×2 IMPLANT
SPONGE LAP 4X18 X RAY DECT (DISPOSABLE) ×2 IMPLANT
STRIP CLOSURE SKIN 1/2X4 (GAUZE/BANDAGES/DRESSINGS) ×2 IMPLANT
SUCTION FRAZIER TIP 10 FR DISP (SUCTIONS) ×2 IMPLANT
SUT FIBERWIRE #2 38 T-5 BLUE (SUTURE) ×6
SUT MNCRL AB 4-0 PS2 18 (SUTURE) ×2 IMPLANT
SUT VIC AB 0 CT1 27 (SUTURE) ×3
SUT VIC AB 0 CT1 27XBRD ANBCTR (SUTURE) ×3 IMPLANT
SUT VIC AB 2-0 CT1 27 (SUTURE) ×2
SUT VIC AB 2-0 CT1 TAPERPNT 27 (SUTURE) ×2 IMPLANT
SUTURE FIBERWR #2 38 T-5 BLUE (SUTURE) ×3 IMPLANT
SYR CONTROL 10ML LL (SYRINGE) ×2 IMPLANT
TAPE CLOTH SURG 6X10 WHT LF (GAUZE/BANDAGES/DRESSINGS) ×2 IMPLANT
TOWEL OR 17X24 6PK STRL BLUE (TOWEL DISPOSABLE) ×2 IMPLANT
TOWEL OR 17X26 10 PK STRL BLUE (TOWEL DISPOSABLE) ×2 IMPLANT
TOWER CARTRIDGE SMART MIX (DISPOSABLE) ×2 IMPLANT
TRAY FOLEY CATH 16FRSI W/METER (SET/KITS/TRAYS/PACK) IMPLANT
WATER STERILE IRR 1000ML POUR (IV SOLUTION) ×2 IMPLANT
YANKAUER SUCT BULB TIP NO VENT (SUCTIONS) IMPLANT

## 2014-09-03 NOTE — Discharge Instructions (Signed)
Ice to the shoulder at all times.  Keep pillow or a rolled blanket propped behind the left arm toe keep the arm across your waist.  Ok to remove the sling in the home and use the arm for daily activity.  No heavy pushing, pulling, or lifting with the left arm.  Keep the incision clean and dry for one week, then ok to get wet in the shower.  Follow up with Dr Veverly Fells in two weeks in the office.  [phone]

## 2014-09-03 NOTE — Brief Op Note (Signed)
09/03/2014  1:37 PM  PATIENT:  [name]  64 y.o. female  PRE-OPERATIVE DIAGNOSIS:  LEFT SHOULDER OA AND ROTATOR CUFF INSUFFIENCY  POST-OPERATIVE DIAGNOSIS:  LEFT SHOULDER OA AND ROTATOR CUFF INSUFFIENCY  PROCEDURE:  Procedure(s): LEFT SHOULDER REVERSE ARTHROPLASTY (Left)  DePuy Delta Extend SURGEON:  Surgeon(s) and Role:    * Augustin Schooling, MD - Primary  PHYSICIAN ASSISTANT:   ASSISTANTS: Ventura Bruns, PA-C   ANESTHESIA:   regional and general  EBL:  Total I/O In: 1350 [I.V.:1350] Out: 150 [Blood:150]  BLOOD ADMINISTERED:none  DRAINS: none   LOCAL MEDICATIONS USED:  MARCAINE     SPECIMEN:  No Specimen  DISPOSITION OF SPECIMEN:  N/A  COUNTS:  YES  TOURNIQUET:  * No tourniquets in log *  DICTATION: .Other Dictation: Dictation Number [id number]  PLAN OF CARE: Admit to inpatient   PATIENT DISPOSITION:  PACU - hemodynamically stable.   Delay start of Pharmacological VTE agent (>24hrs) due to surgical blood loss or risk of bleeding: not applicable

## 2014-09-03 NOTE — Anesthesia Procedure Notes (Addendum)
Procedure Name: Intubation Date/Time: 09/03/2014 11:19 AM Performed by: Neldon Newport Pre-anesthesia Checklist: Patient being monitored, Suction available, Emergency Drugs available, Patient identified and Timeout performed Patient Re-evaluated:Patient Re-evaluated prior to inductionOxygen Delivery Method: Circle system utilized Preoxygenation: Pre-oxygenation with 100% oxygen Intubation Type: IV induction Ventilation: Mask ventilation without difficulty Laryngoscope Size: Mac and 3 Grade View: Grade I Tube type: Oral Tube size: 7.5 mm Number of attempts: 1 Placement Confirmation: positive ETCO2,  ETT inserted through vocal cords under direct vision and breath sounds checked- equal and bilateral Secured at: 22 cm Tube secured with: Tape Dental Injury: Teeth and Oropharynx as per pre-operative assessment    Anesthesia Regional Block:  Interscalene brachial plexus block  Pre-Anesthetic Checklist: ,, timeout performed, Correct Patient, Correct Site, Correct Laterality, Correct Procedure, Correct Position, site marked, Risks and benefits discussed,  Surgical consent,  Pre-op evaluation,  At surgeon's request and post-op pain management  Laterality: Left  Prep: chloraprep       Needles:  Injection technique: Single-shot  Needle Type: Echogenic Stimulator Needle     Needle Length: 5cm 5 cm Needle Gauge: 22 and 22 G    Additional Needles:  Procedures: ultrasound guided (picture in chart) and nerve stimulator Interscalene brachial plexus block  Nerve Stimulator or Paresthesia:  Response: biceps flexion, 0.45 mA,   Additional Responses:   Narrative:  Start time: 09/03/2014 10:28 AM End time: 09/03/2014 10:38 AM Injection made incrementally with aspirations every 5 mL.  Performed by: Personally  Anesthesiologist: Vanellope Passmore  Additional Notes: Functioning IV was confirmed and monitors were applied.  A 46mm 22ga Arrow echogenic stimulator needle was used. Sterile  prep and drape,hand hygiene and sterile gloves were used.  Negative aspiration and negative test dose prior to incremental administration of local anesthetic. The patient tolerated the procedure well.  Ultrasound guidance: relevent anatomy identified, needle position confirmed, local anesthetic spread visualized around nerve(s), vascular puncture avoided.  Image printed for medical record.

## 2014-09-03 NOTE — Transfer of Care (Signed)
Immediate Anesthesia Transfer of Care Note  Patient: [name]  Procedure(s) Performed: Procedure(s): LEFT SHOULDER REVERSE ARTHROPLASTY (Left)  Patient Location: PACU  Anesthesia Type:General  Level of Consciousness: awake, alert  and oriented  Airway & Oxygen Therapy: Patient Spontanous Breathing and Patient connected to face mask oxygen  Post-op Assessment: Report given to RN and Post -op Vital signs reviewed and stable  Post vital signs: Reviewed and stable  Last Vitals:  Filed Vitals:   09/03/14 1055  BP: 125/59  Pulse: 98  Temp:   Resp: 14    Complications: No apparent anesthesia complications

## 2014-09-03 NOTE — Interval H&P Note (Signed)
History and Physical Interval Note:  09/03/2014 10:23 AM  [name]  has presented today for surgery, with the diagnosis of LEFT SHOULDER OA AND ROTATOR CUFF INSUFFIENCY  The various methods of treatment have been discussed with the patient and family. After consideration of risks, benefits and other options for treatment, the patient has consented to  Procedure(s): LEFT SHOULDER REVERSE ARTHROPLASTY (Left) as a surgical intervention .  The patient's history has been reviewed, patient examined, no change in status, stable for surgery.  I have reviewed the patient's chart and labs.  Questions were answered to the patient's satisfaction.     Jaxon Flatt,STEVEN R

## 2014-09-03 NOTE — Anesthesia Preprocedure Evaluation (Addendum)
Anesthesia Evaluation  Patient identified by MRN, date of birth, ID band Patient awake    Reviewed: Allergy & Precautions, H&P , NPO status , Patient's Chart, lab work & pertinent test results  History of Anesthesia Complications (+) PONV  Airway Mallampati: II   Neck ROM: full    Dental  (+) Teeth Intact   Pulmonary asthma ,          Cardiovascular negative cardio ROS      Neuro/Psych  Headaches, Anxiety    GI/Hepatic   Endo/Other    Renal/GU Renal diseaseOne kidney removed.     Musculoskeletal  (+) Arthritis -,   Abdominal   Peds  Hematology   Anesthesia Other Findings   Reproductive/Obstetrics                            Anesthesia Physical Anesthesia Plan  ASA: II  Anesthesia Plan: General and Regional   Post-op Pain Management: MAC Combined w/ Regional for Post-op pain   Induction: Intravenous  Airway Management Planned: Oral ETT  Additional Equipment:   Intra-op Plan:   Post-operative Plan: Extubation in OR  Informed Consent: I have reviewed the patients History and Physical, chart, labs and discussed the procedure including the risks, benefits and alternatives for the proposed anesthesia with the patient or authorized representative who has indicated his/her understanding and acceptance.   Dental Advisory Given  Plan Discussed with: CRNA, Anesthesiologist and Surgeon  Anesthesia Plan Comments:        Anesthesia Quick Evaluation

## 2014-09-04 LAB — BASIC METABOLIC PANEL
Anion gap: 7 (ref 5–15)
BUN: 8 mg/dL (ref 6–23)
CALCIUM: 9.2 mg/dL (ref 8.4–10.5)
CHLORIDE: 105 mmol/L (ref 96–112)
CO2: 26 mmol/L (ref 19–32)
CREATININE: 0.83 mg/dL (ref 0.50–1.10)
GFR calc Af Amer: 86 mL/min — ABNORMAL LOW (ref >90)
GFR calc non Af Amer: 74 mL/min — ABNORMAL LOW (ref >90)
Glucose, Bld: 145 mg/dL — ABNORMAL HIGH (ref 70–99)
Potassium: 4.3 mmol/L (ref 3.5–5.1)
Sodium: 138 mmol/L (ref 135–145)

## 2014-09-04 LAB — HEMOGLOBIN AND HEMATOCRIT, BLOOD
HEMATOCRIT: 34.8 % — AB (ref 36.0–46.0)
Hemoglobin: 11.4 g/dL — ABNORMAL LOW (ref 12.0–15.0)

## 2014-09-04 MED ORDER — ACETAMINOPHEN 500 MG PO TABS
1000.0000 mg | ORAL_TABLET | Freq: Four times a day (QID) | ORAL | Status: DC | PRN
  Administered 2014-09-04: 500 mg via ORAL
  Filled 2014-09-04: qty 2

## 2014-09-04 MED ORDER — TRAMADOL HCL 50 MG PO TABS: 50.0000 mg | ORAL_TABLET | Freq: Four times a day (QID) | ORAL | Status: DC

## 2014-09-04 MED ORDER — TRAMADOL HCL 50 MG PO TABS: 50.0000 mg | ORAL_TABLET | Freq: Four times a day (QID) | ORAL | Status: DC | PRN

## 2014-09-04 MED ORDER — ONDANSETRON HCL 4 MG PO TABS: 4.0000 mg | ORAL_TABLET | Freq: Four times a day (QID) | ORAL | Status: DC | PRN

## 2014-09-04 MED ORDER — TRAMADOL HCL 50 MG PO TABS
50.0000 mg | ORAL_TABLET | Freq: Four times a day (QID) | ORAL | Status: DC
  Administered 2014-09-04: 50 mg via ORAL
  Filled 2014-09-04: qty 1

## 2014-09-04 MED ORDER — ACETAMINOPHEN 650 MG RE SUPP
650.0000 mg | Freq: Four times a day (QID) | RECTAL | Status: DC | PRN
Start: 2014-09-04 — End: 2014-09-04

## 2014-09-04 NOTE — Progress Notes (Signed)
Orthopedics Progress Note  Subjective: I think the block is wearing off.  Pain well controlled  Objective:  Filed Vitals:   09/04/14 0544  BP: 119/57  Pulse: 73  Temp: 97.9 F (36.6 C)  Resp:     General: Awake and alert  Musculoskeletal: left shoulder dressing CDI, NVI including Axillary nerve Neurovascularly intact  Lab Results  Component Value Date   WBC 9.2 08/24/2014   HGB 11.4* 09/04/2014   HCT 34.8* 09/04/2014   MCV 91.5 08/24/2014   PLT 278 08/24/2014       Component Value Date/Time   NA 138 09/04/2014 0541   K 4.3 09/04/2014 0541   CL 105 09/04/2014 0541   CO2 26 09/04/2014 0541   GLUCOSE 145* 09/04/2014 0541   BUN 8 09/04/2014 0541   CREATININE 0.83 09/04/2014 0541   CALCIUM 9.2 09/04/2014 0541   GFRNONAA 74* 09/04/2014 0541   GFRAA 86* 09/04/2014 0541    No results found for: INR, PROTIME  Assessment/Plan: POD #1 s/p Procedure(s): LEFT SHOULDER REVERSE ARTHROPLASTY Shoulder looks great this morning.  D/C home after OT Follow up in two weeks in the office  Remo Lipps R. Veverly Fells, MD 09/04/2014 7:56 AM

## 2014-09-04 NOTE — Op Note (Signed)
NAMESUNDEE, GARLAND               ACCOUNT NO.:  [id number]  MEDICAL RECORD NO.:  [id number]  LOCATION:  [id number]                        FACILITY:  [facility]  PHYSICIAN:  Doran Heater. Veverly Fells, M.D. DATE OF BIRTH:  May 29, 1951  DATE OF PROCEDURE:  09/03/2014 DATE OF DISCHARGE:                              OPERATIVE REPORT   PREOPERATIVE DIAGNOSIS:  Left shoulder, end-stage arthritis and rotator cuff insufficiency.  POSTOPERATIVE DIAGNOSIS:  Left shoulder, end-stage arthritis and rotator cuff insufficiency.  PROCEDURE PERFORMED:  Left shoulder reverse total shoulder arthroplasty using DePuy Delta Xtend prosthesis.  ATTENDING SURGEON:  Doran Heater. Veverly Fells, MD  ASSISTANT:  Abbott Pao. Dixon, PA-C, who was scrubbed the entire procedure and necessary for satisfactory completion of surgery.  ANESTHESIA:  General anesthesia was used plus interscalene block.  ESTIMATED BLOOD LOSS:  Minimal.  FLUID REPLACEMENT:  1200 mL crystalloid.  INSTRUMENT COUNTS:  Correct.  COMPLICATIONS:  There were no complications.  ANTIBIOTICS:  Perioperative antibiotics were given.  INDICATIONS:  Patient is a 64 year old female with worsening left shoulder pain and dysfunction secondary to rotator cuff insufficiency and arthritis.  The patient has failed conservative management including injections, modification activity, anti-inflammatories, and pain medications.  She presents now for elective reverse total shoulder arthroplasty to restore fixed fulcrum mechanics in her shoulder and eliminate pain.  Informed consent obtained.  DESCRIPTION OF PROCEDURE:  After an adequate level of anesthesia was achieved, the patient positioned in modified beach-chair position.  Left shoulder correctly identified and sterilely prepped and draped in usual manner.  Time-out was called.  After sterile prep and drape and time- out, we entered the shoulder, used standard deltopectoral incision starting at the coracoid process extending  down to the anterior humerus. Dissection down through subcutaneous tissues using Bovie, identified the cephalic vein, took it laterally with the deltoid, pectoralis taken medially.  Conjoined tendon was identified and retracted medially.  The subscapularis taken off subperiosteal way off the lesser tuberosity.  We placed #2 FiberWire suture in a modified W stitch to facilitate repair at the end.  We did a capsular release inferiorly off the humerus, was able to then divide the biceps tendon which was still intact, toward the supraspinatus tendon was torn, infraspinatus had some interstitial tearing and damage in the mid tendon as well.  We progressively externally rotated and released the remaining infraspinatus tendon, teres minor we left intact.  We then went ahead and delivered the humerus out of the wound and plate, we found the center point of the humeral shaft and reamed that up to a size 12.  We did use the 12 intramedullary guide and resected the humeral head set on 10 degrees of retroversion.  Once we had that done, we removed excess osteophytes off the medial humeral neck and then did our metaphyseal reaming for the epi 1 left shoulder.  Once we had done that, we placed our trial prosthesis which fit very well, it was stable and gave good coverage.  We then went ahead and retracted the humerus posteriorly and did a 360 degree capsular removal, and glenoid labrum removal.  We removed the remaining cartilage in the shoulder and there was not much, basically this was bone-on-bone  with eburnated bone on the humeral head side and deep wear including grade 4 on the glenoid side.  Once the remaining cartilage was out of the shoulder, we placed our center point guide pin in bicortically.  We then went ahead, reamed for the metaglene which was placed inferiorly right along the inferior bone off the glenoid and then we did our peripheral hand reaming and drilled our center PEG hole,  and then impacted the metaglene in position and we placed 36 lock screws inferiorly and superiorly in an 18 nonlocked posteriorly.  We then placed our 38 eccentric glenosphere into position with the eccentricity doubt inferiorly, we were careful to protect the axillary nerve.  We then went ahead and trialed with a 38+ 6 trial.  We felt like that was quite stable where we could probably get a +9 in place.  We removed our trial components on the humeral side, irrigated the entire shoulder, drilled holes around the biceps groove and placed #2 FiberWire suture in a mattress fashion for repair of the subscapularis.  Once we had the sutures in place, we irrigated again and then used impaction grafting technique with HA coated size 12 stem with an epi 1 left metaphysis and we placed this and had about 10 degrees of retroversion.  We used bone graft from the patient's humeral head and we were able to get a very nice tight Press-Fit fixation.  Once we had that in place, we trialed with a 38+ 9, we were happy with soft tissue balancing and stability with a negative sulcus, negative gapping with external rotation and nice tight conjoined.  We removed the 38+ 6+ 9 trial and then impacted the real 38+ 9 trial on the HA stem and then reduced the shoulder, again happy with the soft tissue stability and balance and range of motion with no impingement.  We irrigated again, checked our axillary nerve was in good position and not pinched by anything and then we repaired the infraspinatus back to the greater tuberosity with #2 FiberWire suture. Several of those and then also did a rotator interval closure or loose rotator interval closure and then repair of subscap anatomic bone with suture through bone and in the bone.  With a nice soft tissue repair, we ranged the shoulder.  No tethering, no stiffness.  We irrigated thoroughly and then closed deltopectoral interval with 0 Vicryl suture followed by 2-0  Vicryl subcutaneous closure and 4-0 Monocryl for skin. Steri-Strips applied followed by sterile dressing.  The patient tolerated surgery well.     Doran Heater. Veverly Fells, M.D.     SRN/MEDQ  D:  09/03/2014  T:  09/04/2014  Job:  [id number]

## 2014-09-04 NOTE — Evaluation (Signed)
Occupational Therapy Evaluation and Discharge Patient Details Name: [name] MRN: [id number] DOB: [birth year] Today's Date: 09/04/2014    History of Present Illness L TSA (reverse)   Clinical Impression   This 64 yo female admitted and underwent above presents to acute OT with all education completed, we will D/C from acute OT and pt to D/C home today.    Follow Up Recommendations  No OT follow up    Equipment Recommendations  None recommended by OT       Precautions / Restrictions Precautions Precautions: Shoulder Shoulder Interventions: Shoulder sling/immobilizer;For comfort Required Braces or Orthoses: Sling Restrictions Weight Bearing Restrictions: Yes LUE Weight Bearing: Non weight bearing      Mobility Bed Mobility Overal bed mobility: Independent                Transfers Overall transfer level: Independent                         ADL                                         General ADL Comments: Pt able to use her LUE to her tolerance with BADLs and she did this during the session, when she could not use it due to pain and range she just used her RUE               Pertinent Vitals/Pain Pain Assessment: 0-10 Pain Score: 2  Pain Location: left shoulder Pain Descriptors / Indicators: Aching Pain Intervention(s): Monitored during session     Hand Dominance Right   Extremity/Trunk Assessment Upper Extremity Assessment Upper Extremity Assessment: LUE deficits/detail LUE Deficits / Details: shoulder surgery this admission           Communication Communication Communication: No difficulties   Cognition Arousal/Alertness: Awake/alert Behavior During Therapy: WFL for tasks assessed/performed Overall Cognitive Status: Within Functional Limits for tasks assessed                           Shoulder Instructions Shoulder Instructions Donning/doffing shirt without moving shoulder: Independent Method for  sponge bathing under operated UE:  (pt aware of technique if having trouble raising arm) Donning/doffing sling/immobilizer:  (pt aware of technique) Pendulum exercises (written home exercise program):  (NA) ROM for elbow, wrist and digits of operated UE: Independent Sling wearing schedule (on at all times/off for ADL's): Independent Dressing change:  (nursing went over this with her per her report) Positioning of UE while sleeping:  (pt is aware post my instruction)    [place] expects to be discharged to:: Private residence Living Arrangements: Spouse/significant other Available Help at Discharge: Family;Available 24 hours/day Type of Home: House                                  Prior Functioning/Environment Level of Independence: Independent             OT Diagnosis: Acute pain         OT Goals(Current goals can be found in the care plan section) Acute Rehab OT Goals Patient Stated Goal: home today  OT Frequency:                End of Session Nurse Communication:  (No more  OT needs, pt is feeling dizzy at times when she is up on her feet, pt wants to wait and D/C after lunch)  Activity Tolerance:  (limited somewhat by dizziness the longer she was up on her feet, she was aware and sat down when she felt she needed to ) Patient left: in chair   Time: [phone] OT Time Calculation (min): 35 min Charges:  OT General Charges $OT Visit: 1 Procedure OT Evaluation $Initial OT Evaluation Tier I: 1 Procedure OT Treatments $Self Care/Home Management : 8-22 mins  Almon Register [phone] 09/04/2014, 11:28 AM

## 2014-09-04 NOTE — Discharge Summary (Signed)
Physician Discharge Summary   Patient ID: [name] MRN: [id number] DOB/AGE: 1951/01/22 64 y.o.  Admit date: 09/03/2014 Discharge date: 09/04/2014  Admission Diagnoses:  Active Problems:   S/P shoulder replacement   Discharge Diagnoses:  Same   Surgeries: Procedure(s): LEFT SHOULDER REVERSE ARTHROPLASTY on 09/03/2014   Consultants: Occupational Therapy  Discharged Condition: Stable  Hospital Course: [name] is an 64 y.o. female who was admitted 09/03/2014 with a chief complaint of left shoulder pain and weakness, and found to have a diagnosis of left shoulder arthritis and rotator cuff insufficiency.  They were brought to the operating room on 09/03/2014 and underwent the above named procedures.    The patient had an uncomplicated hospital course and was stable for discharge.  Recent vital signs:  Filed Vitals:   09/04/14 0544  BP: 119/57  Pulse: 73  Temp: 97.9 F (36.6 C)  Resp:     Recent laboratory studies:  Results for orders placed or performed during the hospital encounter of 09/03/14  Hemoglobin and hematocrit, blood  Result Value Ref Range   Hemoglobin 11.4 (L) 12.0 - 15.0 g/dL   HCT 34.8 (L) 36.0 - 66.4 %  Basic metabolic panel  Result Value Ref Range   Sodium 138 135 - 145 mmol/L   Potassium 4.3 3.5 - 5.1 mmol/L   Chloride 105 96 - 112 mmol/L   CO2 26 19 - 32 mmol/L   Glucose, Bld 145 (H) 70 - 99 mg/dL   BUN 8 6 - 23 mg/dL   Creatinine, Ser 0.83 0.50 - 1.10 mg/dL   Calcium 9.2 8.4 - 10.5 mg/dL   GFR calc non Af Amer 74 (L) >90 mL/min   GFR calc Af Amer 86 (L) >90 mL/min   Anion gap 7 5 - 15    Discharge Medications:     Medication List    TAKE these medications        calcium carbonate 500 MG chewable tablet  Commonly known as:  TUMS - dosed in mg elemental calcium  Chew 1 tablet by mouth daily.     methocarbamol 500 MG tablet  Commonly known as:  ROBAXIN  Take 1 tablet (500 mg total) by mouth 3 (three) times daily as needed.       MULTIVITAMIN PO  Take 1 tablet by mouth daily.     oxyCODONE-acetaminophen 5-325 MG per tablet  Commonly known as:  ROXICET  Take 1-2 tablets by mouth every 4 (four) hours as needed for severe pain.     Vitamin D (Cholecalciferol) 1000 UNITS Tabs  Take 2,000 Units by mouth daily.     zolpidem 10 MG tablet  Commonly known as:  AMBIEN  Take 5 mg by mouth at bedtime as needed for sleep.        Diagnostic Studies: Dg Shoulder Left Port  09/03/2014   CLINICAL DATA:  Postop evaluation.  EXAM: LEFT SHOULDER - 1 VIEW  COMPARISON:  None.  FINDINGS: Single view demonstrates a total left shoulder arthroplasty. The entire humeral stem is visualized. No evidence for a periprosthetic fracture. Mild atelectasis at the left lung base.  IMPRESSION: Left shoulder arthroplasty without complicating features.   Electronically Signed   By: Markus Daft M.D.   On: 09/03/2014 15:08    Disposition: 01-Home or Self Care        Follow-up Information    Follow up with Augustin Schooling, MD.   Specialty:  Orthopedic Surgery   Why:  [phone]   Contact information:  [address] [place] [address] [phone]        Signed: Augustin Schooling 09/04/2014, 7:57 AM

## 2014-09-06 ENCOUNTER — Encounter (HOSPITAL_COMMUNITY): Payer: Self-pay | Admitting: Orthopedic Surgery

## 2014-09-06 NOTE — Anesthesia Postprocedure Evaluation (Signed)
Anesthesia Post Note  Patient: [name]  Procedure(s) Performed: Procedure(s) (LRB): LEFT SHOULDER REVERSE ARTHROPLASTY (Left)  Anesthesia type: General  Patient location: PACU  Post pain: Pain level controlled and Adequate analgesia  Post assessment: Post-op Vital signs reviewed, Patient's Cardiovascular Status Stable, Respiratory Function Stable, Patent Airway and Pain level controlled  Last Vitals:  Filed Vitals:   09/04/14 0544  BP: 119/57  Pulse: 73  Temp: 36.6 C  Resp:     Post vital signs: Reviewed and stable  Level of consciousness: awake, alert  and oriented  Complications: No apparent anesthesia complications

## 2014-11-01 ENCOUNTER — Other Ambulatory Visit: Payer: Self-pay | Admitting: Internal Medicine

## 2014-11-01 ENCOUNTER — Ambulatory Visit
Admission: RE | Admit: 2014-11-01 | Discharge: 2014-11-01 | Disposition: A | Discharge status: Home / Self Care | Payer: BLUE CROSS/BLUE SHIELD | Source: Ambulatory Visit | Attending: Internal Medicine | Admitting: Internal Medicine

## 2014-11-01 DIAGNOSIS — R0602 Shortness of breath: Secondary | ICD-10-CM

## 2014-11-01 DIAGNOSIS — R079 Chest pain, unspecified: Secondary | ICD-10-CM

## 2014-11-01 DIAGNOSIS — R000 Tachycardia, unspecified: Secondary | ICD-10-CM

## 2014-11-01 MED ORDER — IOPAMIDOL (ISOVUE-370) INJECTION 76%
75.0000 mL | Freq: Once | INTRAVENOUS | Status: AC | PRN
  Administered 2014-11-01: 75 mL via INTRAVENOUS

## 2016-06-14 ENCOUNTER — Other Ambulatory Visit (HOSPITAL_COMMUNITY)
Admission: RE | Admit: 2016-06-14 | Discharge: 2016-06-14 | Disposition: A | Discharge status: Home / Self Care | Payer: BLUE CROSS/BLUE SHIELD | Source: Ambulatory Visit | Attending: Nurse Practitioner | Admitting: Nurse Practitioner

## 2016-06-14 ENCOUNTER — Other Ambulatory Visit: Payer: Self-pay | Admitting: Nurse Practitioner

## 2016-06-14 DIAGNOSIS — Z01419 Encounter for gynecological examination (general) (routine) without abnormal findings: Secondary | ICD-10-CM | POA: Insufficient documentation

## 2016-06-14 DIAGNOSIS — Z1151 Encounter for screening for human papillomavirus (HPV): Secondary | ICD-10-CM | POA: Insufficient documentation

## 2016-06-18 LAB — CYTOLOGY - PAP
Diagnosis: NEGATIVE
HPV: NOT DETECTED

## 2016-09-15 DIAGNOSIS — J209 Acute bronchitis, unspecified: Secondary | ICD-10-CM | POA: Diagnosis not present

## 2016-09-15 DIAGNOSIS — R0602 Shortness of breath: Secondary | ICD-10-CM | POA: Diagnosis not present

## 2016-10-16 DIAGNOSIS — H04123 Dry eye syndrome of bilateral lacrimal glands: Secondary | ICD-10-CM | POA: Diagnosis not present

## 2016-11-05 DIAGNOSIS — G8929 Other chronic pain: Secondary | ICD-10-CM | POA: Diagnosis not present

## 2016-11-05 DIAGNOSIS — M5441 Lumbago with sciatica, right side: Secondary | ICD-10-CM | POA: Diagnosis not present

## 2016-11-05 DIAGNOSIS — M25551 Pain in right hip: Secondary | ICD-10-CM | POA: Diagnosis not present

## 2016-11-15 DIAGNOSIS — M5441 Lumbago with sciatica, right side: Secondary | ICD-10-CM | POA: Diagnosis not present

## 2016-11-15 DIAGNOSIS — G8929 Other chronic pain: Secondary | ICD-10-CM | POA: Diagnosis not present

## 2016-11-22 DIAGNOSIS — G8929 Other chronic pain: Secondary | ICD-10-CM | POA: Diagnosis not present

## 2016-11-22 DIAGNOSIS — M5441 Lumbago with sciatica, right side: Secondary | ICD-10-CM | POA: Diagnosis not present

## 2016-11-22 DIAGNOSIS — N1330 Unspecified hydronephrosis: Secondary | ICD-10-CM | POA: Diagnosis not present

## 2016-11-22 DIAGNOSIS — N281 Cyst of kidney, acquired: Secondary | ICD-10-CM | POA: Diagnosis not present

## 2016-11-29 DIAGNOSIS — N281 Cyst of kidney, acquired: Secondary | ICD-10-CM | POA: Diagnosis not present

## 2016-11-29 DIAGNOSIS — Z905 Acquired absence of kidney: Secondary | ICD-10-CM | POA: Diagnosis not present

## 2016-12-03 DIAGNOSIS — N281 Cyst of kidney, acquired: Secondary | ICD-10-CM | POA: Diagnosis not present

## 2016-12-07 DIAGNOSIS — N281 Cyst of kidney, acquired: Secondary | ICD-10-CM | POA: Diagnosis not present

## 2016-12-13 DIAGNOSIS — M5441 Lumbago with sciatica, right side: Secondary | ICD-10-CM | POA: Diagnosis not present

## 2016-12-13 DIAGNOSIS — M47816 Spondylosis without myelopathy or radiculopathy, lumbar region: Secondary | ICD-10-CM | POA: Diagnosis not present

## 2016-12-13 DIAGNOSIS — G8929 Other chronic pain: Secondary | ICD-10-CM | POA: Diagnosis not present

## 2017-01-02 DIAGNOSIS — M47816 Spondylosis without myelopathy or radiculopathy, lumbar region: Secondary | ICD-10-CM | POA: Diagnosis not present

## 2017-01-07 DIAGNOSIS — M1711 Unilateral primary osteoarthritis, right knee: Secondary | ICD-10-CM | POA: Diagnosis not present

## 2017-01-07 DIAGNOSIS — M1712 Unilateral primary osteoarthritis, left knee: Secondary | ICD-10-CM | POA: Diagnosis not present

## 2017-02-22 DIAGNOSIS — J069 Acute upper respiratory infection, unspecified: Secondary | ICD-10-CM | POA: Diagnosis not present

## 2017-03-01 DIAGNOSIS — J309 Allergic rhinitis, unspecified: Secondary | ICD-10-CM | POA: Diagnosis not present

## 2017-03-01 DIAGNOSIS — H6993 Unspecified Eustachian tube disorder, bilateral: Secondary | ICD-10-CM | POA: Diagnosis not present

## 2017-03-28 DIAGNOSIS — M26623 Arthralgia of bilateral temporomandibular joint: Secondary | ICD-10-CM | POA: Diagnosis not present

## 2017-03-28 DIAGNOSIS — H938X3 Other specified disorders of ear, bilateral: Secondary | ICD-10-CM | POA: Diagnosis not present

## 2017-05-14 DIAGNOSIS — M47816 Spondylosis without myelopathy or radiculopathy, lumbar region: Secondary | ICD-10-CM | POA: Diagnosis not present

## 2017-06-01 DIAGNOSIS — N390 Urinary tract infection, site not specified: Secondary | ICD-10-CM | POA: Diagnosis not present

## 2017-06-01 DIAGNOSIS — R350 Frequency of micturition: Secondary | ICD-10-CM | POA: Diagnosis not present

## 2017-06-11 DIAGNOSIS — M533 Sacrococcygeal disorders, not elsewhere classified: Secondary | ICD-10-CM | POA: Diagnosis not present

## 2017-06-24 DIAGNOSIS — Z1231 Encounter for screening mammogram for malignant neoplasm of breast: Secondary | ICD-10-CM | POA: Diagnosis not present

## 2017-07-02 DIAGNOSIS — M533 Sacrococcygeal disorders, not elsewhere classified: Secondary | ICD-10-CM | POA: Diagnosis not present

## 2017-07-04 DIAGNOSIS — M533 Sacrococcygeal disorders, not elsewhere classified: Secondary | ICD-10-CM | POA: Diagnosis not present

## 2017-07-09 DIAGNOSIS — M533 Sacrococcygeal disorders, not elsewhere classified: Secondary | ICD-10-CM | POA: Diagnosis not present

## 2017-07-11 DIAGNOSIS — M533 Sacrococcygeal disorders, not elsewhere classified: Secondary | ICD-10-CM | POA: Diagnosis not present

## 2017-07-16 DIAGNOSIS — M533 Sacrococcygeal disorders, not elsewhere classified: Secondary | ICD-10-CM | POA: Diagnosis not present

## 2017-07-18 DIAGNOSIS — M533 Sacrococcygeal disorders, not elsewhere classified: Secondary | ICD-10-CM | POA: Diagnosis not present

## 2017-07-18 DIAGNOSIS — Z23 Encounter for immunization: Secondary | ICD-10-CM | POA: Diagnosis not present

## 2017-07-18 DIAGNOSIS — Z471 Aftercare following joint replacement surgery: Secondary | ICD-10-CM | POA: Diagnosis not present

## 2017-07-18 DIAGNOSIS — N760 Acute vaginitis: Secondary | ICD-10-CM | POA: Diagnosis not present

## 2017-07-18 DIAGNOSIS — Z96612 Presence of left artificial shoulder joint: Secondary | ICD-10-CM | POA: Diagnosis not present

## 2017-07-18 DIAGNOSIS — M8588 Other specified disorders of bone density and structure, other site: Secondary | ICD-10-CM | POA: Diagnosis not present

## 2017-07-18 DIAGNOSIS — M7541 Impingement syndrome of right shoulder: Secondary | ICD-10-CM | POA: Diagnosis not present

## 2017-07-18 DIAGNOSIS — Z01419 Encounter for gynecological examination (general) (routine) without abnormal findings: Secondary | ICD-10-CM | POA: Diagnosis not present

## 2017-07-23 DIAGNOSIS — M533 Sacrococcygeal disorders, not elsewhere classified: Secondary | ICD-10-CM | POA: Diagnosis not present

## 2017-08-14 DIAGNOSIS — M533 Sacrococcygeal disorders, not elsewhere classified: Secondary | ICD-10-CM | POA: Diagnosis not present

## 2017-08-29 DIAGNOSIS — H1045 Other chronic allergic conjunctivitis: Secondary | ICD-10-CM | POA: Diagnosis not present

## 2017-10-02 ENCOUNTER — Other Ambulatory Visit: Payer: Self-pay | Admitting: Internal Medicine

## 2017-10-02 DIAGNOSIS — G4700 Insomnia, unspecified: Secondary | ICD-10-CM | POA: Diagnosis not present

## 2017-10-02 DIAGNOSIS — R10815 Periumbilic abdominal tenderness: Secondary | ICD-10-CM | POA: Diagnosis not present

## 2017-10-02 DIAGNOSIS — I700 Atherosclerosis of aorta: Secondary | ICD-10-CM | POA: Diagnosis not present

## 2017-10-03 DIAGNOSIS — M8588 Other specified disorders of bone density and structure, other site: Secondary | ICD-10-CM | POA: Diagnosis not present

## 2017-10-04 ENCOUNTER — Other Ambulatory Visit: Payer: BLUE CROSS/BLUE SHIELD

## 2017-10-08 ENCOUNTER — Ambulatory Visit
Admission: RE | Admit: 2017-10-08 | Discharge: 2017-10-08 | Disposition: A | Discharge status: Home / Self Care | Payer: PPO | Source: Ambulatory Visit | Attending: Internal Medicine | Admitting: Internal Medicine

## 2017-10-08 DIAGNOSIS — K449 Diaphragmatic hernia without obstruction or gangrene: Secondary | ICD-10-CM | POA: Diagnosis not present

## 2017-10-08 DIAGNOSIS — R10815 Periumbilic abdominal tenderness: Secondary | ICD-10-CM

## 2017-10-18 DIAGNOSIS — M859 Disorder of bone density and structure, unspecified: Secondary | ICD-10-CM | POA: Diagnosis not present

## 2017-10-24 DIAGNOSIS — M8580 Other specified disorders of bone density and structure, unspecified site: Secondary | ICD-10-CM | POA: Diagnosis not present

## 2017-10-29 DIAGNOSIS — H5213 Myopia, bilateral: Secondary | ICD-10-CM | POA: Diagnosis not present

## 2017-10-29 DIAGNOSIS — H2513 Age-related nuclear cataract, bilateral: Secondary | ICD-10-CM | POA: Diagnosis not present

## 2017-11-06 DIAGNOSIS — I700 Atherosclerosis of aorta: Secondary | ICD-10-CM | POA: Diagnosis not present

## 2017-11-06 DIAGNOSIS — R10815 Periumbilic abdominal tenderness: Secondary | ICD-10-CM | POA: Diagnosis not present

## 2017-11-06 DIAGNOSIS — G4700 Insomnia, unspecified: Secondary | ICD-10-CM | POA: Diagnosis not present

## 2018-01-04 DIAGNOSIS — J209 Acute bronchitis, unspecified: Secondary | ICD-10-CM | POA: Diagnosis not present

## 2018-01-14 DIAGNOSIS — K30 Functional dyspepsia: Secondary | ICD-10-CM | POA: Diagnosis not present

## 2018-01-14 DIAGNOSIS — R05 Cough: Secondary | ICD-10-CM | POA: Diagnosis not present

## 2018-01-14 DIAGNOSIS — J45901 Unspecified asthma with (acute) exacerbation: Secondary | ICD-10-CM | POA: Diagnosis not present

## 2018-02-20 DIAGNOSIS — R0981 Nasal congestion: Secondary | ICD-10-CM | POA: Diagnosis not present

## 2018-02-21 ENCOUNTER — Other Ambulatory Visit: Payer: Self-pay | Admitting: Internal Medicine

## 2018-02-21 ENCOUNTER — Ambulatory Visit
Admission: RE | Admit: 2018-02-21 | Discharge: 2018-02-21 | Disposition: A | Discharge status: Home / Self Care | Payer: PPO | Source: Ambulatory Visit | Attending: Internal Medicine | Admitting: Internal Medicine

## 2018-02-21 DIAGNOSIS — R0981 Nasal congestion: Secondary | ICD-10-CM

## 2018-02-26 DIAGNOSIS — H6980 Other specified disorders of Eustachian tube, unspecified ear: Secondary | ICD-10-CM | POA: Diagnosis not present

## 2018-02-26 DIAGNOSIS — R002 Palpitations: Secondary | ICD-10-CM | POA: Diagnosis not present

## 2018-03-05 DIAGNOSIS — H6983 Other specified disorders of Eustachian tube, bilateral: Secondary | ICD-10-CM | POA: Diagnosis not present

## 2018-03-05 DIAGNOSIS — J309 Allergic rhinitis, unspecified: Secondary | ICD-10-CM | POA: Diagnosis not present

## 2018-03-17 DIAGNOSIS — R05 Cough: Secondary | ICD-10-CM | POA: Diagnosis not present

## 2018-03-17 DIAGNOSIS — K219 Gastro-esophageal reflux disease without esophagitis: Secondary | ICD-10-CM | POA: Diagnosis not present

## 2018-03-17 DIAGNOSIS — J3089 Other allergic rhinitis: Secondary | ICD-10-CM | POA: Diagnosis not present

## 2018-03-24 DIAGNOSIS — M9901 Segmental and somatic dysfunction of cervical region: Secondary | ICD-10-CM | POA: Diagnosis not present

## 2018-03-24 DIAGNOSIS — M9900 Segmental and somatic dysfunction of head region: Secondary | ICD-10-CM | POA: Diagnosis not present

## 2018-03-24 DIAGNOSIS — R51 Headache: Secondary | ICD-10-CM | POA: Diagnosis not present

## 2018-03-24 DIAGNOSIS — M542 Cervicalgia: Secondary | ICD-10-CM | POA: Diagnosis not present

## 2018-03-26 DIAGNOSIS — M9900 Segmental and somatic dysfunction of head region: Secondary | ICD-10-CM | POA: Diagnosis not present

## 2018-03-26 DIAGNOSIS — R51 Headache: Secondary | ICD-10-CM | POA: Diagnosis not present

## 2018-03-26 DIAGNOSIS — M542 Cervicalgia: Secondary | ICD-10-CM | POA: Diagnosis not present

## 2018-03-26 DIAGNOSIS — M9901 Segmental and somatic dysfunction of cervical region: Secondary | ICD-10-CM | POA: Diagnosis not present

## 2018-03-27 DIAGNOSIS — M542 Cervicalgia: Secondary | ICD-10-CM | POA: Diagnosis not present

## 2018-03-27 DIAGNOSIS — M9901 Segmental and somatic dysfunction of cervical region: Secondary | ICD-10-CM | POA: Diagnosis not present

## 2018-03-27 DIAGNOSIS — R51 Headache: Secondary | ICD-10-CM | POA: Diagnosis not present

## 2018-03-27 DIAGNOSIS — M9900 Segmental and somatic dysfunction of head region: Secondary | ICD-10-CM | POA: Diagnosis not present

## 2018-03-31 DIAGNOSIS — M542 Cervicalgia: Secondary | ICD-10-CM | POA: Diagnosis not present

## 2018-03-31 DIAGNOSIS — R51 Headache: Secondary | ICD-10-CM | POA: Diagnosis not present

## 2018-03-31 DIAGNOSIS — M9901 Segmental and somatic dysfunction of cervical region: Secondary | ICD-10-CM | POA: Diagnosis not present

## 2018-03-31 DIAGNOSIS — M9900 Segmental and somatic dysfunction of head region: Secondary | ICD-10-CM | POA: Diagnosis not present

## 2018-04-02 DIAGNOSIS — M9901 Segmental and somatic dysfunction of cervical region: Secondary | ICD-10-CM | POA: Diagnosis not present

## 2018-04-02 DIAGNOSIS — R51 Headache: Secondary | ICD-10-CM | POA: Diagnosis not present

## 2018-04-02 DIAGNOSIS — M542 Cervicalgia: Secondary | ICD-10-CM | POA: Diagnosis not present

## 2018-04-02 DIAGNOSIS — M9900 Segmental and somatic dysfunction of head region: Secondary | ICD-10-CM | POA: Diagnosis not present

## 2018-04-03 DIAGNOSIS — M542 Cervicalgia: Secondary | ICD-10-CM | POA: Diagnosis not present

## 2018-04-03 DIAGNOSIS — M9900 Segmental and somatic dysfunction of head region: Secondary | ICD-10-CM | POA: Diagnosis not present

## 2018-04-03 DIAGNOSIS — M9901 Segmental and somatic dysfunction of cervical region: Secondary | ICD-10-CM | POA: Diagnosis not present

## 2018-04-03 DIAGNOSIS — R51 Headache: Secondary | ICD-10-CM | POA: Diagnosis not present

## 2018-04-08 DIAGNOSIS — M9900 Segmental and somatic dysfunction of head region: Secondary | ICD-10-CM | POA: Diagnosis not present

## 2018-04-08 DIAGNOSIS — M542 Cervicalgia: Secondary | ICD-10-CM | POA: Diagnosis not present

## 2018-04-08 DIAGNOSIS — R51 Headache: Secondary | ICD-10-CM | POA: Diagnosis not present

## 2018-04-08 DIAGNOSIS — M9901 Segmental and somatic dysfunction of cervical region: Secondary | ICD-10-CM | POA: Diagnosis not present

## 2018-04-10 DIAGNOSIS — M542 Cervicalgia: Secondary | ICD-10-CM | POA: Diagnosis not present

## 2018-04-10 DIAGNOSIS — M9901 Segmental and somatic dysfunction of cervical region: Secondary | ICD-10-CM | POA: Diagnosis not present

## 2018-04-10 DIAGNOSIS — M9900 Segmental and somatic dysfunction of head region: Secondary | ICD-10-CM | POA: Diagnosis not present

## 2018-04-10 DIAGNOSIS — R51 Headache: Secondary | ICD-10-CM | POA: Diagnosis not present

## 2018-04-14 DIAGNOSIS — M9900 Segmental and somatic dysfunction of head region: Secondary | ICD-10-CM | POA: Diagnosis not present

## 2018-04-14 DIAGNOSIS — M9901 Segmental and somatic dysfunction of cervical region: Secondary | ICD-10-CM | POA: Diagnosis not present

## 2018-04-14 DIAGNOSIS — M542 Cervicalgia: Secondary | ICD-10-CM | POA: Diagnosis not present

## 2018-04-14 DIAGNOSIS — R51 Headache: Secondary | ICD-10-CM | POA: Diagnosis not present

## 2018-04-17 DIAGNOSIS — M542 Cervicalgia: Secondary | ICD-10-CM | POA: Diagnosis not present

## 2018-04-17 DIAGNOSIS — M9901 Segmental and somatic dysfunction of cervical region: Secondary | ICD-10-CM | POA: Diagnosis not present

## 2018-04-17 DIAGNOSIS — M9900 Segmental and somatic dysfunction of head region: Secondary | ICD-10-CM | POA: Diagnosis not present

## 2018-04-17 DIAGNOSIS — R51 Headache: Secondary | ICD-10-CM | POA: Diagnosis not present

## 2018-04-22 DIAGNOSIS — R51 Headache: Secondary | ICD-10-CM | POA: Diagnosis not present

## 2018-04-22 DIAGNOSIS — M9900 Segmental and somatic dysfunction of head region: Secondary | ICD-10-CM | POA: Diagnosis not present

## 2018-04-22 DIAGNOSIS — M9901 Segmental and somatic dysfunction of cervical region: Secondary | ICD-10-CM | POA: Diagnosis not present

## 2018-04-22 DIAGNOSIS — M542 Cervicalgia: Secondary | ICD-10-CM | POA: Diagnosis not present

## 2018-04-24 DIAGNOSIS — M542 Cervicalgia: Secondary | ICD-10-CM | POA: Diagnosis not present

## 2018-04-24 DIAGNOSIS — M9900 Segmental and somatic dysfunction of head region: Secondary | ICD-10-CM | POA: Diagnosis not present

## 2018-04-24 DIAGNOSIS — M9901 Segmental and somatic dysfunction of cervical region: Secondary | ICD-10-CM | POA: Diagnosis not present

## 2018-04-24 DIAGNOSIS — R51 Headache: Secondary | ICD-10-CM | POA: Diagnosis not present

## 2018-04-28 ENCOUNTER — Ambulatory Visit
Admission: RE | Admit: 2018-04-28 | Discharge: 2018-04-28 | Disposition: A | Discharge status: Home / Self Care | Payer: PPO | Source: Ambulatory Visit | Attending: Internal Medicine | Admitting: Internal Medicine

## 2018-04-28 ENCOUNTER — Other Ambulatory Visit (HOSPITAL_COMMUNITY): Payer: Self-pay | Admitting: Internal Medicine

## 2018-04-28 ENCOUNTER — Other Ambulatory Visit: Payer: Self-pay | Admitting: Internal Medicine

## 2018-04-28 DIAGNOSIS — R079 Chest pain, unspecified: Secondary | ICD-10-CM | POA: Diagnosis not present

## 2018-04-28 DIAGNOSIS — R071 Chest pain on breathing: Secondary | ICD-10-CM

## 2018-04-28 DIAGNOSIS — R000 Tachycardia, unspecified: Secondary | ICD-10-CM | POA: Diagnosis not present

## 2018-04-28 DIAGNOSIS — I499 Cardiac arrhythmia, unspecified: Secondary | ICD-10-CM | POA: Diagnosis not present

## 2018-04-28 DIAGNOSIS — I493 Ventricular premature depolarization: Secondary | ICD-10-CM | POA: Diagnosis not present

## 2018-04-28 DIAGNOSIS — Z23 Encounter for immunization: Secondary | ICD-10-CM | POA: Diagnosis not present

## 2018-04-30 ENCOUNTER — Ambulatory Visit (HOSPITAL_COMMUNITY): Payer: PPO | Attending: Cardiology

## 2018-04-30 ENCOUNTER — Other Ambulatory Visit: Payer: Self-pay

## 2018-04-30 DIAGNOSIS — I499 Cardiac arrhythmia, unspecified: Secondary | ICD-10-CM | POA: Diagnosis not present

## 2018-04-30 DIAGNOSIS — I371 Nonrheumatic pulmonary valve insufficiency: Secondary | ICD-10-CM | POA: Insufficient documentation

## 2018-04-30 DIAGNOSIS — R079 Chest pain, unspecified: Secondary | ICD-10-CM | POA: Diagnosis not present

## 2018-05-16 DIAGNOSIS — R002 Palpitations: Secondary | ICD-10-CM | POA: Diagnosis not present

## 2018-05-16 DIAGNOSIS — R000 Tachycardia, unspecified: Secondary | ICD-10-CM | POA: Diagnosis not present

## 2018-06-02 DIAGNOSIS — I471 Supraventricular tachycardia: Secondary | ICD-10-CM | POA: Diagnosis not present

## 2018-06-02 DIAGNOSIS — R000 Tachycardia, unspecified: Secondary | ICD-10-CM | POA: Diagnosis not present

## 2018-06-04 DIAGNOSIS — J309 Allergic rhinitis, unspecified: Secondary | ICD-10-CM | POA: Diagnosis not present

## 2018-06-04 DIAGNOSIS — Z23 Encounter for immunization: Secondary | ICD-10-CM | POA: Diagnosis not present

## 2018-06-04 DIAGNOSIS — Z1389 Encounter for screening for other disorder: Secondary | ICD-10-CM | POA: Diagnosis not present

## 2018-06-04 DIAGNOSIS — R071 Chest pain on breathing: Secondary | ICD-10-CM | POA: Diagnosis not present

## 2018-06-04 DIAGNOSIS — Z0000 Encounter for general adult medical examination without abnormal findings: Secondary | ICD-10-CM | POA: Diagnosis not present

## 2018-06-04 DIAGNOSIS — I493 Ventricular premature depolarization: Secondary | ICD-10-CM | POA: Diagnosis not present

## 2018-06-04 DIAGNOSIS — I499 Cardiac arrhythmia, unspecified: Secondary | ICD-10-CM | POA: Diagnosis not present

## 2018-06-04 DIAGNOSIS — R002 Palpitations: Secondary | ICD-10-CM | POA: Diagnosis not present

## 2018-06-04 DIAGNOSIS — G4700 Insomnia, unspecified: Secondary | ICD-10-CM | POA: Diagnosis not present

## 2018-06-24 DIAGNOSIS — Z803 Family history of malignant neoplasm of breast: Secondary | ICD-10-CM | POA: Diagnosis not present

## 2018-06-24 DIAGNOSIS — Z1231 Encounter for screening mammogram for malignant neoplasm of breast: Secondary | ICD-10-CM | POA: Diagnosis not present

## 2018-07-18 DIAGNOSIS — R197 Diarrhea, unspecified: Secondary | ICD-10-CM | POA: Diagnosis not present

## 2018-07-18 DIAGNOSIS — R1033 Periumbilical pain: Secondary | ICD-10-CM | POA: Diagnosis not present

## 2018-08-11 DIAGNOSIS — R197 Diarrhea, unspecified: Secondary | ICD-10-CM | POA: Diagnosis not present

## 2018-08-11 DIAGNOSIS — J069 Acute upper respiratory infection, unspecified: Secondary | ICD-10-CM | POA: Diagnosis not present

## 2018-08-27 DIAGNOSIS — Z7189 Other specified counseling: Secondary | ICD-10-CM | POA: Diagnosis not present

## 2018-08-27 DIAGNOSIS — R197 Diarrhea, unspecified: Secondary | ICD-10-CM | POA: Diagnosis not present

## 2018-08-27 DIAGNOSIS — G4700 Insomnia, unspecified: Secondary | ICD-10-CM | POA: Diagnosis not present

## 2018-08-27 DIAGNOSIS — R1033 Periumbilical pain: Secondary | ICD-10-CM | POA: Diagnosis not present

## 2018-08-27 DIAGNOSIS — J069 Acute upper respiratory infection, unspecified: Secondary | ICD-10-CM | POA: Diagnosis not present

## 2018-08-27 DIAGNOSIS — R030 Elevated blood-pressure reading, without diagnosis of hypertension: Secondary | ICD-10-CM | POA: Diagnosis not present

## 2018-10-03 DIAGNOSIS — M533 Sacrococcygeal disorders, not elsewhere classified: Secondary | ICD-10-CM | POA: Diagnosis not present

## 2018-10-07 DIAGNOSIS — M1711 Unilateral primary osteoarthritis, right knee: Secondary | ICD-10-CM | POA: Diagnosis not present

## 2018-10-07 DIAGNOSIS — M170 Bilateral primary osteoarthritis of knee: Secondary | ICD-10-CM | POA: Diagnosis not present

## 2018-10-07 DIAGNOSIS — M1712 Unilateral primary osteoarthritis, left knee: Secondary | ICD-10-CM | POA: Diagnosis not present

## 2018-10-24 DIAGNOSIS — M79671 Pain in right foot: Secondary | ICD-10-CM | POA: Diagnosis not present

## 2018-11-03 DIAGNOSIS — M2041 Other hammer toe(s) (acquired), right foot: Secondary | ICD-10-CM | POA: Diagnosis not present

## 2018-11-03 DIAGNOSIS — M2021 Hallux rigidus, right foot: Secondary | ICD-10-CM | POA: Diagnosis not present

## 2018-11-03 DIAGNOSIS — M21611 Bunion of right foot: Secondary | ICD-10-CM | POA: Diagnosis not present

## 2018-11-03 DIAGNOSIS — M7741 Metatarsalgia, right foot: Secondary | ICD-10-CM | POA: Diagnosis not present

## 2018-11-18 DIAGNOSIS — G4700 Insomnia, unspecified: Secondary | ICD-10-CM | POA: Diagnosis not present

## 2018-12-03 DIAGNOSIS — G4700 Insomnia, unspecified: Secondary | ICD-10-CM | POA: Diagnosis not present

## 2018-12-31 ENCOUNTER — Other Ambulatory Visit (HOSPITAL_COMMUNITY): Payer: Self-pay | Admitting: Orthopedic Surgery

## 2019-01-01 DIAGNOSIS — G4700 Insomnia, unspecified: Secondary | ICD-10-CM | POA: Diagnosis not present

## 2019-02-10 DIAGNOSIS — R05 Cough: Secondary | ICD-10-CM | POA: Diagnosis not present

## 2019-02-10 IMAGING — RF DG UGI W/ HIGH DENSITY W/KUB
5 series · 14 of 20 positions shown · non-contrast
Comparison: CT abdomen pelvis of 12/03/2016

CLINICAL DATA: Periumbilical abdominal tenderness

EXAM:
UPPER GI SERIES WITH KUB
TECHNIQUE: After obtaining a scout radiograph a routine upper GI series was
performed using thin and high density barium.
FLUOROSCOPY TIME:  Fluoroscopy Time:  1 minutes 48 seconds
Radiation Exposure Index (if provided by the fluoroscopic device):
164 mGy
Number of Acquired Spot Images: 0

[Series 1: one shot · 1 of 1 slices shown (1 of 3)]
[im 1/1]
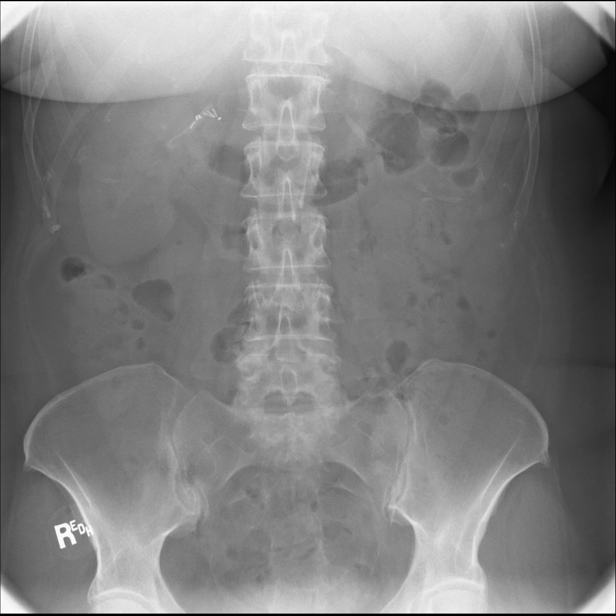

[Series 2: sequence · 2 of 8 frames shown (1 of 2)]
[frame 5/8]
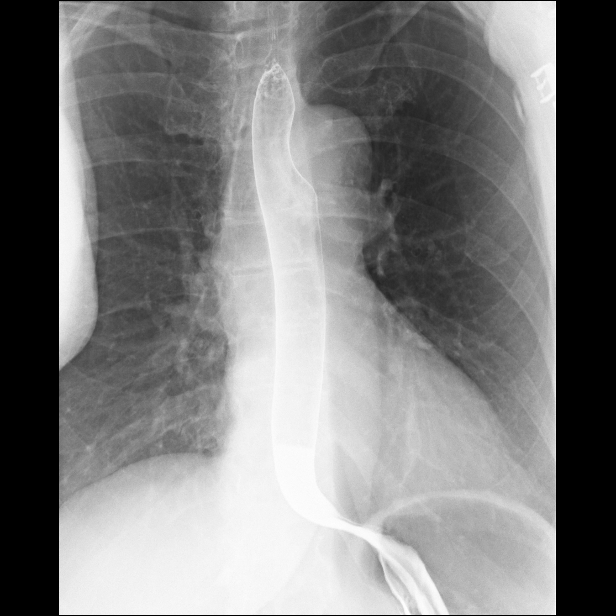
[frame 7/8]
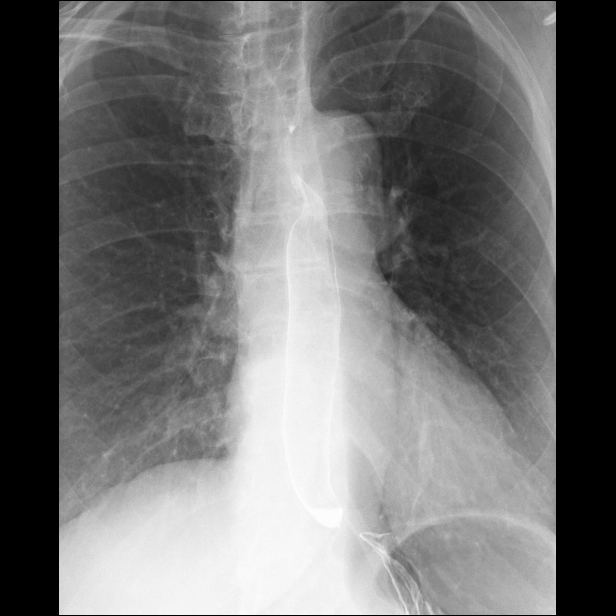

[Series 3: one shot · 3 of 4 slices shown (2 of 3)]
[im 2/4]
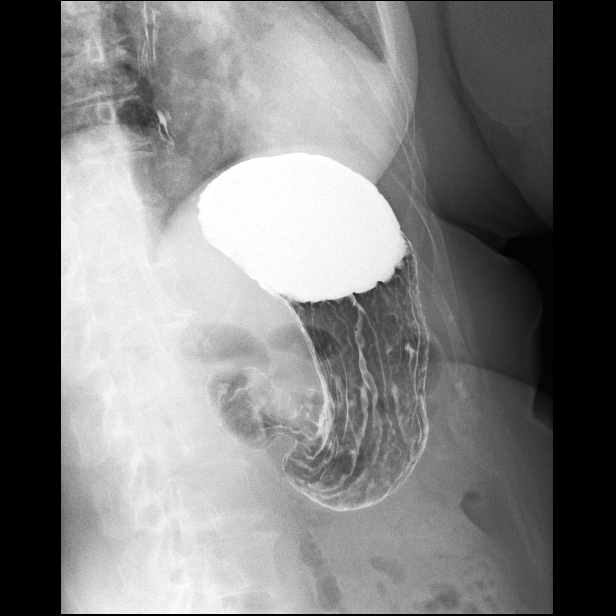
[im 3/4]
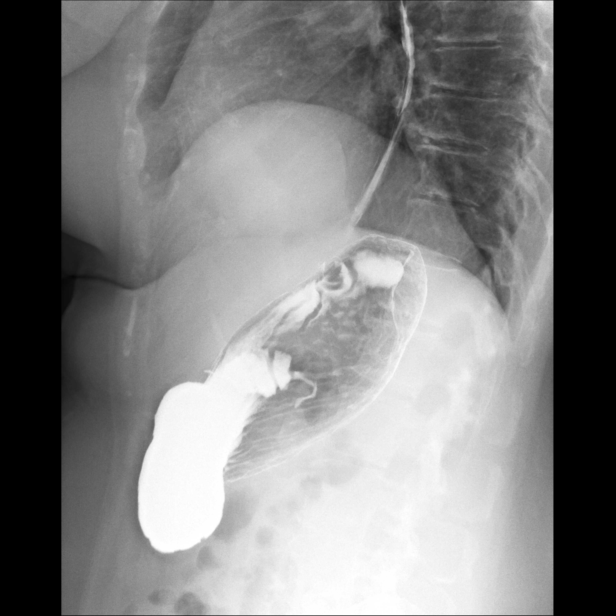
[im 4/4]
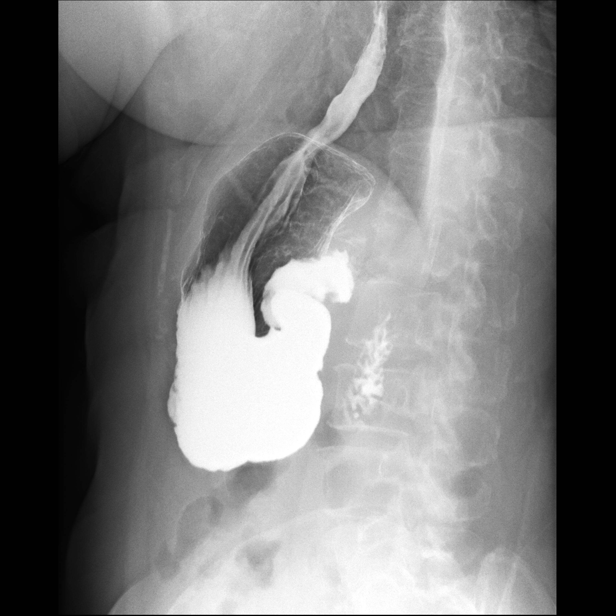

[Series 4: sequence · 2 of 50 frames shown (2 of 2)]
[frame 13/50]
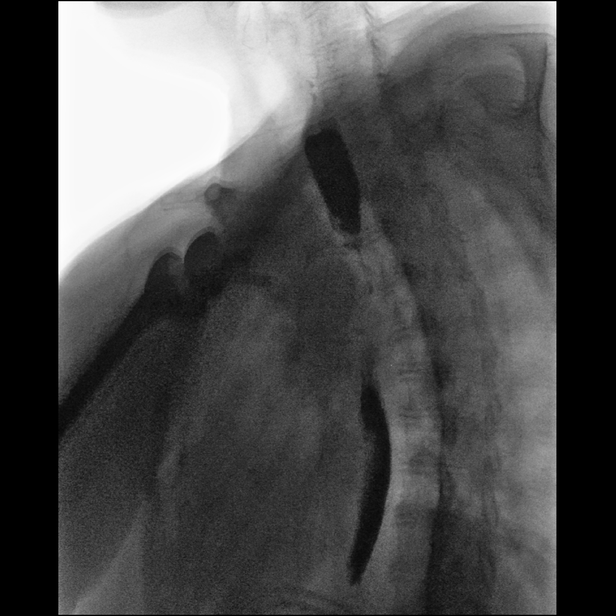
[frame 26/50]
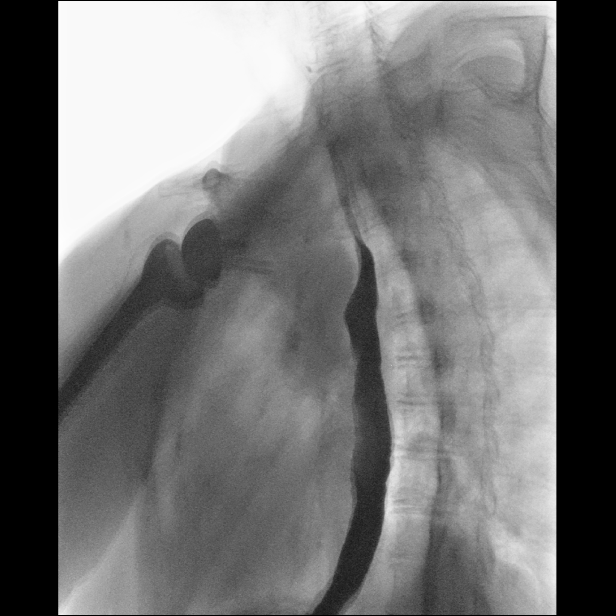

[Series 5: one shot · 6 of 8 slices shown (3 of 3)]
[im 1/8]
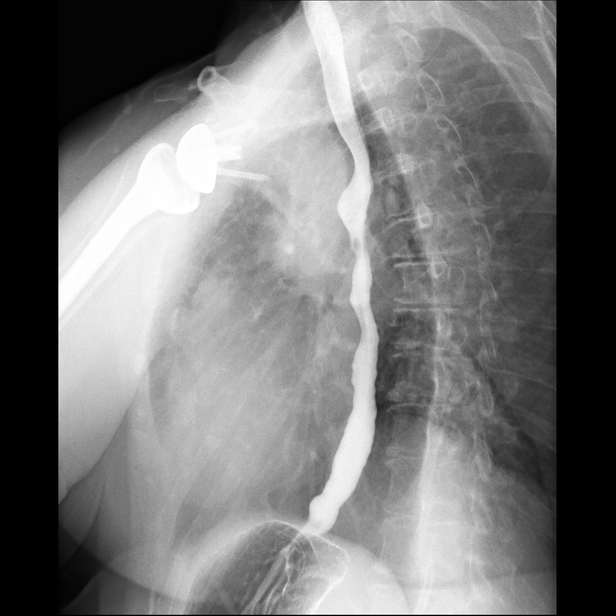
[im 2/8]
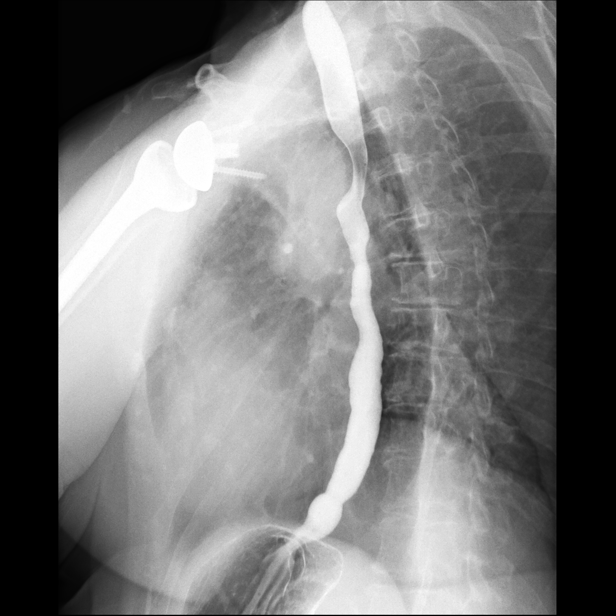
[im 4/8]
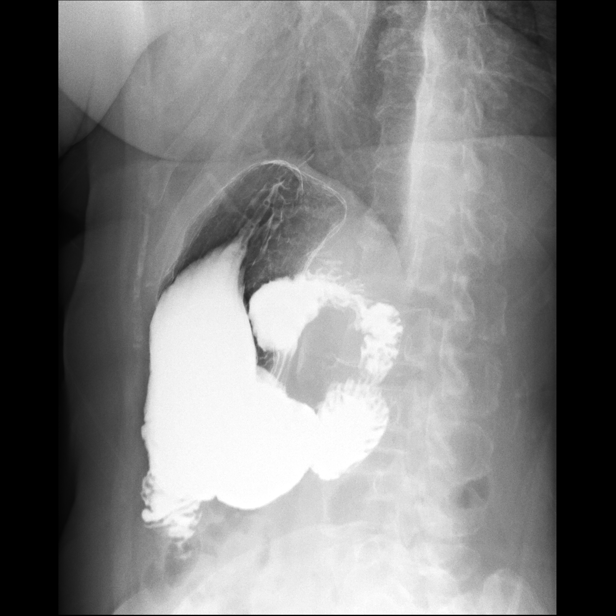
[im 5/8]
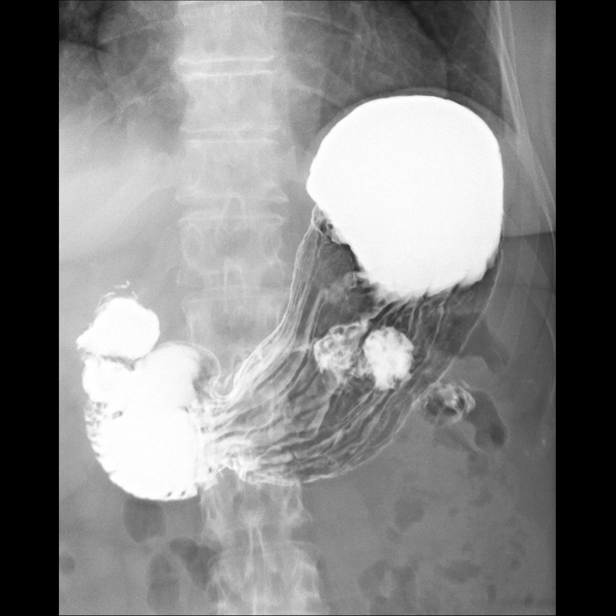
[im 6/8]
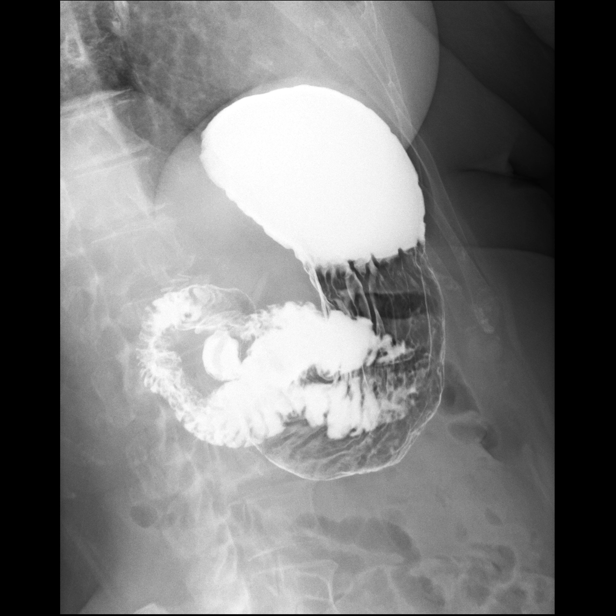
[im 8/8]
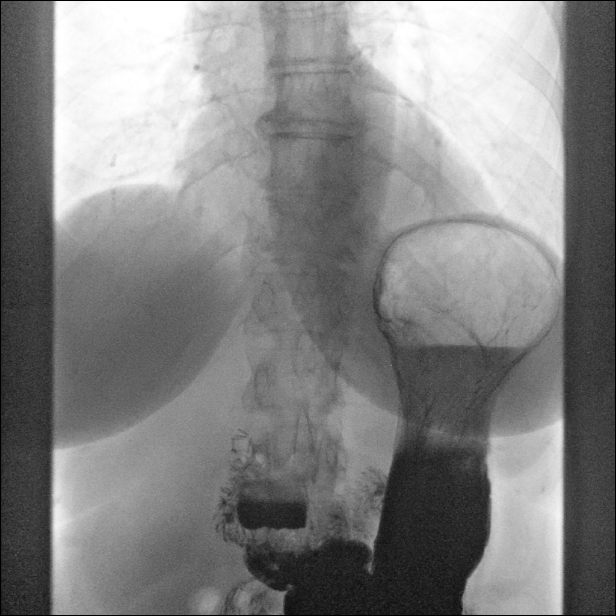

[14 of 20 positions shown; findings below may reference images not displayed]

FINDINGS: A preliminary film of the abdomen shows a nonspecific bowel gas
pattern. Surgical clips are present in the right upper quadrant
prior cholecystectomy.

A double-contrast study was performed. The mucosa of the esophagus
is unremarkable. Single contrast study shows the swallowing
mechanism to be grossly normal. There is slight prominence of the
cricopharyngeus muscle noted. There are mild tertiary contractions
in the mid and distal esophagus. There is a small sliding hiatal
hernia present. With the water siphon maneuver, no definite
gastroesophageal reflux could be demonstrated. A barium pill was
given at the end of the study which passed into the stomach without
delay.

The stomach is normal in contour and peristalsis. The duodenal bulb
fills and the duodenal loop is in normal position
IMPRESSION: 1. Small sliding hiatal hernia. No gastroesophageal reflux could be
demonstrated. Slight prominence of the cricopharyngeus muscle.
2. Barium pill passes into the stomach without delay.
3. Mild tertiary contractions in the mid and distal esophagus.
4. No abnormality of the stomach or duodenum.

## 2019-02-12 DIAGNOSIS — M25511 Pain in right shoulder: Secondary | ICD-10-CM | POA: Diagnosis not present

## 2019-02-19 ENCOUNTER — Other Ambulatory Visit: Payer: Self-pay

## 2019-02-19 ENCOUNTER — Encounter (HOSPITAL_BASED_OUTPATIENT_CLINIC_OR_DEPARTMENT_OTHER): Payer: Self-pay

## 2019-02-23 ENCOUNTER — Other Ambulatory Visit (HOSPITAL_COMMUNITY)
Admission: RE | Admit: 2019-02-23 | Discharge: 2019-02-23 | Disposition: A | Discharge status: Home / Self Care | Payer: PPO | Source: Ambulatory Visit | Attending: Orthopedic Surgery | Admitting: Orthopedic Surgery

## 2019-02-23 DIAGNOSIS — Z1159 Encounter for screening for other viral diseases: Secondary | ICD-10-CM | POA: Insufficient documentation

## 2019-02-23 NOTE — Progress Notes (Signed)
Ensure Pre-Surgery drink given to patient with instructions to complete by 0400 DOS.  Patient verbalized understanding of instructions.

## 2019-02-24 LAB — SARS CORONAVIRUS 2 (TAT 6-24 HRS): SARS Coronavirus 2: NEGATIVE

## 2019-02-26 ENCOUNTER — Ambulatory Visit (HOSPITAL_BASED_OUTPATIENT_CLINIC_OR_DEPARTMENT_OTHER)
Admission: RE | Admit: 2019-02-26 | Discharge: 2019-02-26 | Disposition: A | Discharge status: Home / Self Care | Payer: PPO | Attending: Orthopedic Surgery | Admitting: Orthopedic Surgery

## 2019-02-26 ENCOUNTER — Encounter (HOSPITAL_BASED_OUTPATIENT_CLINIC_OR_DEPARTMENT_OTHER): Payer: Self-pay | Admitting: Emergency Medicine

## 2019-02-26 ENCOUNTER — Other Ambulatory Visit: Payer: Self-pay

## 2019-02-26 ENCOUNTER — Ambulatory Visit (HOSPITAL_BASED_OUTPATIENT_CLINIC_OR_DEPARTMENT_OTHER): Payer: PPO | Admitting: Certified Registered"

## 2019-02-26 ENCOUNTER — Encounter (HOSPITAL_BASED_OUTPATIENT_CLINIC_OR_DEPARTMENT_OTHER)
Admission: RE | Disposition: A | Discharge status: Home / Self Care | Payer: Self-pay | Source: Home / Self Care | Attending: Orthopedic Surgery

## 2019-02-26 DIAGNOSIS — K219 Gastro-esophageal reflux disease without esophagitis: Secondary | ICD-10-CM | POA: Insufficient documentation

## 2019-02-26 DIAGNOSIS — M7741 Metatarsalgia, right foot: Secondary | ICD-10-CM | POA: Diagnosis not present

## 2019-02-26 DIAGNOSIS — J45909 Unspecified asthma, uncomplicated: Secondary | ICD-10-CM | POA: Diagnosis not present

## 2019-02-26 DIAGNOSIS — G8918 Other acute postprocedural pain: Secondary | ICD-10-CM | POA: Diagnosis not present

## 2019-02-26 DIAGNOSIS — M19011 Primary osteoarthritis, right shoulder: Secondary | ICD-10-CM | POA: Insufficient documentation

## 2019-02-26 DIAGNOSIS — M2021 Hallux rigidus, right foot: Secondary | ICD-10-CM | POA: Diagnosis not present

## 2019-02-26 DIAGNOSIS — Z905 Acquired absence of kidney: Secondary | ICD-10-CM | POA: Insufficient documentation

## 2019-02-26 DIAGNOSIS — M21611 Bunion of right foot: Secondary | ICD-10-CM | POA: Insufficient documentation

## 2019-02-26 DIAGNOSIS — M67471 Ganglion, right ankle and foot: Secondary | ICD-10-CM | POA: Insufficient documentation

## 2019-02-26 DIAGNOSIS — M19012 Primary osteoarthritis, left shoulder: Secondary | ICD-10-CM | POA: Insufficient documentation

## 2019-02-26 DIAGNOSIS — M2041 Other hammer toe(s) (acquired), right foot: Secondary | ICD-10-CM | POA: Insufficient documentation

## 2019-02-26 HISTORY — PX: ARTHRODESIS METATARSALPHALANGEAL JOINT (MTPJ): SHX6566

## 2019-02-26 HISTORY — DX: Gastro-esophageal reflux disease without esophagitis: K21.9

## 2019-02-26 HISTORY — PX: HAMMERTOE RECONSTRUCTION WITH WEIL OSTEOTOMY: SHX5631

## 2019-02-26 SURGERY — FUSION, JOINT, GREAT TOE
Anesthesia: General | Site: Foot | Laterality: Right

## 2019-02-26 MED ORDER — FENTANYL CITRATE (PF) 100 MCG/2ML IJ SOLN
INTRAMUSCULAR | Status: AC
  Filled 2019-02-26: qty 2

## 2019-02-26 MED ORDER — SUCCINYLCHOLINE CHLORIDE 200 MG/10ML IV SOSY
PREFILLED_SYRINGE | INTRAVENOUS | Status: AC
  Filled 2019-02-26: qty 10

## 2019-02-26 MED ORDER — ROPIVACAINE HCL 5 MG/ML IJ SOLN
INTRAMUSCULAR | Status: DC | PRN
  Administered 2019-02-26 (×2): 15 mL via PERINEURAL

## 2019-02-26 MED ORDER — OXYCODONE HCL 5 MG PO TABS
5.0000 mg | ORAL_TABLET | Freq: Once | ORAL | Status: AC
  Administered 2019-02-26: 5 mg via ORAL

## 2019-02-26 MED ORDER — LIDOCAINE HCL (CARDIAC) PF 100 MG/5ML IV SOSY
PREFILLED_SYRINGE | INTRAVENOUS | Status: DC | PRN
  Administered 2019-02-26: 30 mg via INTRAVENOUS

## 2019-02-26 MED ORDER — FENTANYL CITRATE (PF) 100 MCG/2ML IJ SOLN
25.0000 ug | INTRAMUSCULAR | Status: DC | PRN
  Administered 2019-02-26: 25 ug via INTRAVENOUS

## 2019-02-26 MED ORDER — PROPOFOL 500 MG/50ML IV EMUL
INTRAVENOUS | Status: DC | PRN
  Administered 2019-02-26: 25 ug/kg/min via INTRAVENOUS

## 2019-02-26 MED ORDER — PROPOFOL 500 MG/50ML IV EMUL
INTRAVENOUS | Status: AC
  Filled 2019-02-26: qty 50

## 2019-02-26 MED ORDER — LACTATED RINGERS IV SOLN
INTRAVENOUS | Status: DC
  Administered 2019-02-26 (×2): via INTRAVENOUS

## 2019-02-26 MED ORDER — FENTANYL CITRATE (PF) 100 MCG/2ML IJ SOLN
50.0000 ug | INTRAMUSCULAR | Status: DC | PRN
  Administered 2019-02-26: 100 ug via INTRAVENOUS
  Administered 2019-02-26: 25 ug via INTRAVENOUS

## 2019-02-26 MED ORDER — CEFAZOLIN SODIUM-DEXTROSE 2-4 GM/100ML-% IV SOLN
INTRAVENOUS | Status: AC
  Filled 2019-02-26: qty 100

## 2019-02-26 MED ORDER — 0.9 % SODIUM CHLORIDE (POUR BTL) OPTIME
TOPICAL | Status: DC | PRN
  Administered 2019-02-26: 150 mL

## 2019-02-26 MED ORDER — OXYCODONE HCL 5 MG PO TABS
ORAL_TABLET | ORAL | Status: AC
  Filled 2019-02-26: qty 1

## 2019-02-26 MED ORDER — EPHEDRINE 5 MG/ML INJ
INTRAVENOUS | Status: AC
  Filled 2019-02-26: qty 20

## 2019-02-26 MED ORDER — DEXAMETHASONE SODIUM PHOSPHATE 10 MG/ML IJ SOLN
INTRAMUSCULAR | Status: AC
  Filled 2019-02-26: qty 5

## 2019-02-26 MED ORDER — DEXAMETHASONE SODIUM PHOSPHATE 10 MG/ML IJ SOLN
INTRAMUSCULAR | Status: DC | PRN
  Administered 2019-02-26: 10 mg via INTRAVENOUS

## 2019-02-26 MED ORDER — MEPERIDINE HCL 25 MG/ML IJ SOLN: 6.2500 mg | INTRAMUSCULAR | Status: DC | PRN

## 2019-02-26 MED ORDER — SODIUM CHLORIDE 0.9 % IV SOLN: INTRAVENOUS | Status: DC

## 2019-02-26 MED ORDER — CHLORHEXIDINE GLUCONATE 4 % EX LIQD: 60.0000 mL | Freq: Once | CUTANEOUS | Status: DC

## 2019-02-26 MED ORDER — EPHEDRINE 5 MG/ML INJ
INTRAVENOUS | Status: AC
  Filled 2019-02-26: qty 10

## 2019-02-26 MED ORDER — VANCOMYCIN HCL 500 MG IV SOLR
INTRAVENOUS | Status: AC
  Filled 2019-02-26: qty 500

## 2019-02-26 MED ORDER — ONDANSETRON HCL 4 MG/2ML IJ SOLN
INTRAMUSCULAR | Status: AC
  Filled 2019-02-26: qty 20

## 2019-02-26 MED ORDER — PROPOFOL 10 MG/ML IV BOLUS
INTRAVENOUS | Status: DC | PRN
  Administered 2019-02-26: 120 mg via INTRAVENOUS

## 2019-02-26 MED ORDER — MIDAZOLAM HCL 2 MG/2ML IJ SOLN
1.0000 mg | INTRAMUSCULAR | Status: DC | PRN
  Administered 2019-02-26: 1 mg via INTRAVENOUS

## 2019-02-26 MED ORDER — MIDAZOLAM HCL 2 MG/2ML IJ SOLN
INTRAMUSCULAR | Status: AC
  Filled 2019-02-26: qty 2

## 2019-02-26 MED ORDER — LIDOCAINE 2% (20 MG/ML) 5 ML SYRINGE
INTRAMUSCULAR | Status: AC
  Filled 2019-02-26: qty 25

## 2019-02-26 MED ORDER — OXYCODONE HCL 5 MG PO TABS: 5.0000 mg | ORAL_TABLET | Freq: Four times a day (QID) | ORAL | 0 refills | Status: AC | PRN

## 2019-02-26 MED ORDER — PHENYLEPHRINE 40 MCG/ML (10ML) SYRINGE FOR IV PUSH (FOR BLOOD PRESSURE SUPPORT)
PREFILLED_SYRINGE | INTRAVENOUS | Status: AC
  Filled 2019-02-26: qty 10

## 2019-02-26 MED ORDER — METOCLOPRAMIDE HCL 5 MG/ML IJ SOLN: 10.0000 mg | Freq: Once | INTRAMUSCULAR | Status: DC | PRN

## 2019-02-26 MED ORDER — CEFAZOLIN SODIUM-DEXTROSE 2-4 GM/100ML-% IV SOLN
2.0000 g | INTRAVENOUS | Status: AC
  Administered 2019-02-26: 2 g via INTRAVENOUS

## 2019-02-26 SURGICAL SUPPLY — 101 items
BANDAGE ESMARK 6X9 LF (GAUZE/BANDAGES/DRESSINGS) IMPLANT
BIT DRILL 1.8 CANN MAX VPC (BIT) ×2 IMPLANT
BIT DRILL 2.7XCANN QCK CNCT (BIT) ×1 IMPLANT
BIT DRILL CANN 2.7 (BIT) ×1
BIT DRILL Q-C 2.0 DIA 100 (BIT) ×2 IMPLANT
BIT DRL 2.7XCANN QCK CNCT (BIT) ×1
BLADE AVERAGE 25X9 (BLADE) IMPLANT
BLADE LONG MED 25X9 (BLADE) IMPLANT
BLADE MICRO SAGITTAL (BLADE) IMPLANT
BLADE OSC/SAG .038X5.5 CUT EDG (BLADE) IMPLANT
BLADE SURG 15 STRL LF DISP TIS (BLADE) ×4 IMPLANT
BLADE SURG 15 STRL SS (BLADE) ×4
BNDG COHESIVE 4X5 TAN STRL (GAUZE/BANDAGES/DRESSINGS) IMPLANT
BNDG COHESIVE 6X5 TAN STRL LF (GAUZE/BANDAGES/DRESSINGS) IMPLANT
BNDG CONFORM 2 STRL LF (GAUZE/BANDAGES/DRESSINGS) IMPLANT
BNDG CONFORM 3 STRL LF (GAUZE/BANDAGES/DRESSINGS) ×2 IMPLANT
BNDG ELASTIC 4X5.8 VLCR STR LF (GAUZE/BANDAGES/DRESSINGS) IMPLANT
BNDG ESMARK 4X9 LF (GAUZE/BANDAGES/DRESSINGS) IMPLANT
BNDG ESMARK 6X9 LF (GAUZE/BANDAGES/DRESSINGS)
BOOT STEPPER DURA LG (SOFTGOODS) IMPLANT
BOOT STEPPER DURA MED (SOFTGOODS) IMPLANT
BOOT STEPPER DURA SM (SOFTGOODS) ×2 IMPLANT
BOOT STEPPER DURA XLG (SOFTGOODS) IMPLANT
CAP PIN PROTECTOR ORTHO WHT (CAP) IMPLANT
CHLORAPREP W/TINT 26 (MISCELLANEOUS) ×2 IMPLANT
COVER BACK TABLE REUSABLE LG (DRAPES) ×2 IMPLANT
COVER WAND RF STERILE (DRAPES) IMPLANT
CUFF TOURN SGL QUICK 34 (TOURNIQUET CUFF) ×1
CUFF TRNQT CYL 34X4.125X (TOURNIQUET CUFF) ×1 IMPLANT
DRAPE EXTREMITY T 121X128X90 (DISPOSABLE) ×2 IMPLANT
DRAPE HALF SHEET 70X43 (DRAPES) ×4 IMPLANT
DRAPE OEC MINIVIEW 54X84 (DRAPES) ×2 IMPLANT
DRAPE U-SHAPE 47X51 STRL (DRAPES) ×2 IMPLANT
DRIVER BIT HEX CANN 1.5 (ORTHOPEDIC DISPOSABLE SUPPLIES) ×2 IMPLANT
DRSG MEPITEL 4X7.2 (GAUZE/BANDAGES/DRESSINGS) ×2 IMPLANT
DRSG PAD ABDOMINAL 8X10 ST (GAUZE/BANDAGES/DRESSINGS) ×2 IMPLANT
ELECT REM PT RETURN 9FT ADLT (ELECTROSURGICAL) ×2
ELECTRODE REM PT RTRN 9FT ADLT (ELECTROSURGICAL) ×1 IMPLANT
GAUZE SPONGE 4X4 12PLY STRL (GAUZE/BANDAGES/DRESSINGS) ×2 IMPLANT
GLOVE BIO SURGEON STRL SZ7 (GLOVE) ×2 IMPLANT
GLOVE BIO SURGEON STRL SZ8 (GLOVE) ×2 IMPLANT
GLOVE BIOGEL PI IND STRL 7.0 (GLOVE) ×1 IMPLANT
GLOVE BIOGEL PI IND STRL 7.5 (GLOVE) ×1 IMPLANT
GLOVE BIOGEL PI IND STRL 8 (GLOVE) ×2 IMPLANT
GLOVE BIOGEL PI INDICATOR 7.0 (GLOVE) ×1
GLOVE BIOGEL PI INDICATOR 7.5 (GLOVE) ×1
GLOVE BIOGEL PI INDICATOR 8 (GLOVE) ×2
GLOVE ECLIPSE 8.0 STRL XLNG CF (GLOVE) ×2 IMPLANT
GOWN STRL REUS W/ TWL LRG LVL3 (GOWN DISPOSABLE) IMPLANT
GOWN STRL REUS W/ TWL XL LVL3 (GOWN DISPOSABLE) ×2 IMPLANT
GOWN STRL REUS W/TWL LRG LVL3 (GOWN DISPOSABLE)
GOWN STRL REUS W/TWL XL LVL3 (GOWN DISPOSABLE) ×2
K-WIRE .054X4 (WIRE) IMPLANT
K-WIRE ACE 1.6X6 (WIRE) ×4
K-WIRE COCR 0.9X95 (WIRE) ×2
KWIRE ACE 1.6X6 (WIRE) ×2 IMPLANT
KWIRE COCR 0.9X95 (WIRE) ×1 IMPLANT
NEEDLE HYPO 22GX1.5 SAFETY (NEEDLE) IMPLANT
NS IRRIG 1000ML POUR BTL (IV SOLUTION) ×2 IMPLANT
PACK BASIN DAY SURGERY FS (CUSTOM PROCEDURE TRAY) ×2 IMPLANT
PAD CAST 4YDX4 CTTN HI CHSV (CAST SUPPLIES) ×1 IMPLANT
PADDING CAST ABS 4INX4YD NS (CAST SUPPLIES)
PADDING CAST ABS COTTON 4X4 ST (CAST SUPPLIES) IMPLANT
PADDING CAST COTTON 4X4 STRL (CAST SUPPLIES) ×1
PADDING CAST COTTON 6X4 STRL (CAST SUPPLIES) IMPLANT
PASSER SUT SWANSON 36MM LOOP (INSTRUMENTS) IMPLANT
PENCIL BUTTON HOLSTER BLD 10FT (ELECTRODE) ×2 IMPLANT
PLATE TUB 39 W/COLLAR 5H (Plate) ×2 IMPLANT
SANITIZER HAND PURELL 535ML FO (MISCELLANEOUS) ×2 IMPLANT
SCREW 2.7X16MM (Screw) ×1 IMPLANT
SCREW CANN THRD 4X42 108 (Screw) ×2 IMPLANT
SCREW CANN THRD SD 4.0X38 (Screw) ×2 IMPLANT
SCREW CORT 2.5X12X2.7XST SM (Screw) ×1 IMPLANT
SCREW CORT 2.5X20X2.7XST SM (Screw) ×1 IMPLANT
SCREW CORTICAL 2.7X12 (Screw) ×1 IMPLANT
SCREW CORTICAL 2.7X18MM (Screw) ×2 IMPLANT
SCREW CORTICAL 2.7X20MM (Screw) ×1 IMPLANT
SCREW HCS TWIST-OFF 2.0X12MM (Screw) ×4 IMPLANT
SCREW NLOCK CORT 2.7X16 NS (Screw) ×1 IMPLANT
SCREW VPC 2.5X16MM (Screw) ×2 IMPLANT
SLEEVE SCD COMPRESS KNEE MED (MISCELLANEOUS) ×2 IMPLANT
SPLINT FAST PLASTER 5X30 (CAST SUPPLIES)
SPLINT PLASTER CAST FAST 5X30 (CAST SUPPLIES) IMPLANT
SPONGE LAP 18X18 RF (DISPOSABLE) ×2 IMPLANT
SPONGE SURGIFOAM ABS GEL 12-7 (HEMOSTASIS) IMPLANT
STOCKINETTE 6  STRL (DRAPES) ×1
STOCKINETTE 6 STRL (DRAPES) ×1 IMPLANT
SUCTION FRAZIER HANDLE 10FR (MISCELLANEOUS) ×1
SUCTION TUBE FRAZIER 10FR DISP (MISCELLANEOUS) ×1 IMPLANT
SUT ETHILON 3 0 PS 1 (SUTURE) ×2 IMPLANT
SUT MNCRL AB 3-0 PS2 18 (SUTURE) ×2 IMPLANT
SUT VIC AB 0 SH 27 (SUTURE) IMPLANT
SUT VIC AB 2-0 SH 27 (SUTURE) ×1
SUT VIC AB 2-0 SH 27XBRD (SUTURE) ×1 IMPLANT
SUT VICRYL 0 UR6 27IN ABS (SUTURE) ×2 IMPLANT
SYR BULB 3OZ (MISCELLANEOUS) ×2 IMPLANT
SYR CONTROL 10ML LL (SYRINGE) IMPLANT
TOWEL GREEN STERILE FF (TOWEL DISPOSABLE) ×4 IMPLANT
TUBE CONNECTING 20X1/4 (TUBING) ×2 IMPLANT
UNDERPAD 30X30 (UNDERPADS AND DIAPERS) ×2 IMPLANT
YANKAUER SUCT BULB TIP NO VENT (SUCTIONS) IMPLANT

## 2019-02-26 NOTE — Anesthesia Procedure Notes (Signed)
Anesthesia Regional Block: Popliteal block   Pre-Anesthetic Checklist: ,, timeout performed, Correct Patient, Correct Site, Correct Laterality, Correct Procedure, Correct Position, site marked, Risks and benefits discussed,  Surgical consent,  Pre-op evaluation,  At surgeon's request and post-op pain management  Laterality: Right and Lower  Prep: Maximum Sterile Barrier Precautions used, chloraprep       Needles:  Injection technique: Single-shot  Needle Type: Echogenic Stimulator Needle     Needle Length: 10cm      Additional Needles:   Procedures:,,,, ultrasound used (permanent image in chart),,,,  Narrative:  Start time: 02/26/2019 7:06 AM End time: 02/26/2019 7:10 AM Injection made incrementally with aspirations every 5 mL.  Performed by: Personally  Anesthesiologist: Montez Hageman, MD  Additional Notes: Risks, benefits and alternative to block explained extensively.  Patient tolerated procedure well, without complications.

## 2019-02-26 NOTE — Transfer of Care (Signed)
Immediate Anesthesia Transfer of Care Note  Patient: RENISE GILLIES  Procedure(s) Performed: Right silver bunionectomy, hallux metatarsal phalangeal joint arthrodesis (Right Foot) Second and Third Weil osteotomy; Second hammertoe correction (Right Foot)  Patient Location: PACU  Anesthesia Type:GA combined with regional for post-op pain  Level of Consciousness: sedated and patient cooperative  Airway & Oxygen Therapy: Patient Spontanous Breathing and Patient connected to nasal cannula oxygen  Post-op Assessment: Report given to RN and Post -op Vital signs reviewed and stable  Post vital signs: Reviewed and stable  Last Vitals:  Vitals Value Taken Time  BP    Temp    Pulse 104 02/26/19 0906  Resp    SpO2 98 % 02/26/19 0906  Vitals shown include unvalidated device data.  Last Pain:  Vitals:   02/26/19 0640  TempSrc: Oral  PainSc: 0-No pain         Complications: No apparent anesthesia complications

## 2019-02-26 NOTE — Discharge Instructions (Signed)
Wylene Simmer, MD [facility]  Please read the following information regarding your care after surgery.  Medications  You only need a prescription for the narcotic pain medicine (ex. oxycodone, Percocet, Norco).  All of the other medicines listed below are available over the counter. X Aleve 2 pills twice a day for the first 3 days after surgery. X acetominophen (Tylenol) 650 mg every 4-6 hours as you need for minor to moderate pain X oxycodone as prescribed for severe pain  Narcotic pain medicine (ex. oxycodone, Percocet, Vicodin) will cause constipation.  To prevent this problem, take the following medicines while you are taking any pain medicine. X docusate sodium (Colace) 100 mg twice a day X senna (Senokot) 2 tablets twice a day  X To help prevent blood clots, take a baby aspirin (81 mg) twice a day for two weeks after surgery.  You should also get up every hour while you are awake to move around.    Weight Bearing X Bear weight when you are able on your operated leg or foot in the CAM boot on your heel only. ? Bear weight only on your operated foot in the post-op shoe. ? Do not bear any weight on the operated leg or foot.  Cast / Splint / Dressing X Keep your boot, cast or dressing clean and dry.  Dont put anything (coat hanger, pencil, etc) down inside of it.  If it gets damp, use a hair dryer on the cool setting to dry it.  If it gets soaked, call the office to schedule an appointment for a cast change. ? Remove your dressing 3 days after surgery and cover the incisions with dry dressings.    After your dressing, cast or splint is removed; you may shower, but do not soak or scrub the wound.  Allow the water to run over it, and then gently pat it dry.  Swelling It is normal for you to have swelling where you had surgery.  To reduce swelling and pain, keep your toes above your nose for at least 3 days after surgery.  It may be necessary to keep your foot or leg elevated for several  weeks.  If it hurts, it should be elevated.  Follow Up Call my office at [phone] when you are discharged from the hospital or surgery center to schedule an appointment to be seen two weeks after surgery.  Call my office at [phone] if you develop a fever >101.5 F, nausea, vomiting, bleeding from the surgical site or severe pain.     Post Anesthesia Home Care Instructions  Activity: Get plenty of rest for the remainder of the day. A responsible individual must stay with you for 24 hours following the procedure.  For the next 24 hours, DO NOT: -Drive a car -Paediatric nurse -Drink alcoholic beverages -Take any medication unless instructed by your physician -Make any legal decisions or sign important papers.  Meals: Start with liquid foods such as gelatin or soup. Progress to regular foods as tolerated. Avoid greasy, spicy, heavy foods. If nausea and/or vomiting occur, drink only clear liquids until the nausea and/or vomiting subsides. Call your physician if vomiting continues.  Special Instructions/Symptoms: Your throat may feel dry or sore from the anesthesia or the breathing tube placed in your throat during surgery. If this causes discomfort, gargle with warm salt water. The discomfort should disappear within 24 hours.  If you had a scopolamine patch placed behind your ear for the management of post- operative nausea and/or vomiting:  1. The medication in the patch is effective for 72 hours, after which it should be removed.  Wrap patch in a tissue and discard in the trash. Wash hands thoroughly with soap and water. 2. You may remove the patch earlier than 72 hours if you experience unpleasant side effects which may include dry mouth, dizziness or visual disturbances. 3. Avoid touching the patch. Wash your hands with soap and water after contact with the patch.        Regional Anesthesia Blocks  1. Numbness or the inability to move the "blocked" extremity may last  from 3-48 hours after placement. The length of time depends on the medication injected and your individual response to the medication. If the numbness is not going away after 48 hours, call your surgeon.  2. The extremity that is blocked will need to be protected until the numbness is gone and the  Strength has returned. Because you cannot feel it, you will need to take extra care to avoid injury. Because it may be weak, you may have difficulty moving it or using it. You may not know what position it is in without looking at it while the block is in effect.  3. For blocks in the legs and feet, returning to weight bearing and walking needs to be done carefully. You will need to wait until the numbness is entirely gone and the strength has returned. You should be able to move your leg and foot normally before you try and bear weight or walk. You will need someone to be with you when you first try to ensure you do not fall and possibly risk injury.  4. Bruising and tenderness at the needle site are common side effects and will resolve in a few days.  5. Persistent numbness or new problems with movement should be communicated to the surgeon or the [place] [phone] [place] [phone]).

## 2019-02-26 NOTE — Anesthesia Postprocedure Evaluation (Signed)
Anesthesia Post Note  Patient: ISSABELLA RIX  Procedure(s) Performed: Right silver bunionectomy, hallux metatarsal phalangeal joint arthrodesis (Right Foot) Second and Third Weil osteotomy; Second hammertoe correction (Right Foot)     Patient location during evaluation: PACU Anesthesia Type: General Level of consciousness: awake and alert Pain management: pain level controlled Vital Signs Assessment: post-procedure vital signs reviewed and stable Respiratory status: spontaneous breathing, nonlabored ventilation, respiratory function stable and patient connected to nasal cannula oxygen Cardiovascular status: blood pressure returned to baseline and stable Postop Assessment: no apparent nausea or vomiting Anesthetic complications: no    Last Vitals:  Vitals:   02/26/19 1039 02/26/19 1123  BP:  (!) 170/90  Pulse: 90 100  Resp: 12 20  Temp:  37 C  SpO2: 96% 98%    Last Pain:  Vitals:   02/26/19 1123  TempSrc: Oral  PainSc: 3                  Montez Hageman

## 2019-02-26 NOTE — Op Note (Signed)
02/26/2019  9:24 AM  PATIENT:  [name]  68 y.o. female  PRE-OPERATIVE DIAGNOSIS: 1.  Right foot bunion deformity 2.  Right hallux rigidus 3.  Right forefoot metatarsalgia 4.  Right second and third hammertoe deformities  POST-OPERATIVE DIAGNOSIS: Same 5.  Right first MTP joint ganglion cyst  Procedure(s): 1.  Right silver bunionectomy 2.  Excision of right forefoot ganglion cyst 3.  Right hallux MP joint arthrodesis 4.  Right second and third metatarsal Weil osteotomies 5.  Right second hammertoe correction 6.  Repair of right second MTP joint lateral collateral ligament 7.  Right foot AP and lateral radiographs  SURGEON:  Wylene Simmer, MD  ASSISTANT: None  ANESTHESIA:   General, regional  EBL:  minimal   TOURNIQUET:   Total Tourniquet Time Documented: Thigh (Right) - 66 minutes Total: Thigh (Right) - 66 minutes  COMPLICATIONS:  None apparent  DISPOSITION:  Extubated, awake and stable to recovery.  INDICATION FOR PROCEDURE: The patient is a 68 year old female with a long history of right forefoot pain due to a bunion, hallux rigidus and metatarsalgia as well as second and third hammertoe deformities.  She has failed nonoperative treatment and presents today for surgery.  The risks and benefits of the alternative treatment options have been discussed in detail.  The patient wishes to proceed with surgery and specifically understands risks of bleeding, infection, nerve damage, blood clots, need for additional surgery, amputation and death.  PROCEDURE IN DETAIL:  After pre operative consent was obtained, and the correct operative site was identified, the patient was brought to the operating room and placed supine on the OR table.  Anesthesia was administered.  Pre-operative antibiotics were administered.  A surgical timeout was taken.  The right lower extremity was prepped and draped in standard sterile fashion with a tourniquet around the thigh.  The extremity was  elevated and the tourniquet was inflated to 350 mmHg.  A longitudinal incision was made over the hallux MP joint.  Dissection was carried down through the subcutaneous tissues.  The extensor tendons were protected.  The dorsal joint capsule was incised and elevated medially and laterally.  The collateral ligaments were released.  A ganglion cyst was encountered at the medial aspect of the hallux MP joint.  This was excised sharply in its entirety.  The medial eminence was exposed.  A rondure was used to perform a silver bunionectomy.  A K wire was then inserted in the center of the metatarsal head.  An 18 mm concave reamer was used to remove the remaining articular cartilage and subchondral bone.  The K wire was then placed in the base of the proximal phalanx.  The convex reamer was used to remove the remaining articular cartilage and subchondral bone.  The wound was then irrigated copiously.  A 2 mm drill bit was used to perforate both sides of the joint leaving the resultant bone graft in place.  The joint was reduced and provisionally pinned.  Radiographs and a simulated weightbearing examination showed appropriate position of the toe.  The toe was then fixed with a 4 mm partially-threaded stainless steel cannulated screw.  A 5 hole one quarter tubular plate was then placed across the dorsum of the joint and fixed proximally and distally with bicortical screws.  Radiographs showed appropriate position and length of the hardware and appropriate reduction of the joint.  The wound was irrigated and closed with Vicryl and nylon.  Attention was turned to the second webspace where a  longitudinal incision was made.  Dissection was carried down through the subcutaneous tissues.  The second MTP joint was opened with a dorsal arthrotomy.  The extensor tendons were protected.  A Weil osteotomy was performed shortening the metatarsal appropriately.  The osteotomy was fixed with a 2 mm Biomet FRS screw.  Overhanging bone  was trimmed with a rondure.  The same procedure was then performed for the third metatarsal.  The lateral collateral ligament of the second MTP joint was repaired with a box suture of 0 Vicryl.  This was tagged for later approximation.  Attention was then turned to the second toe where a transverse incision was made over the PIP joint.  Dissection was carried down through the subcutaneous tissues and extensor mechanism.  The head of the proximal phalanx was resected followed by the base of the middle phalanx.  The joint was reduced and fixed with a 2.5 mm Biomet VPC screw.  The toe was then reduced.  The lateral collateral ligament suture was tied repairing the lateral collateral ligament appropriately.  Final AP and lateral radiographs confirmed appropriate position and length of all hardware and appropriate correction of the forefoot deformities.  The dorsal incision was then irrigated and closed with Monocryl and nylon.  Sterile dressings were applied followed by a compression wrap.  The tourniquet was released after application of the dressings.  The patient was awakened from anesthesia and transported to the recovery room in stable condition.  FOLLOW UP PLAN: Weightbearing as tolerated on the heel in a cam boot.  Follow-up in the office in 2 weeks for suture removal and a toe spacer.   RADIOGRAPHS: AP and lateral radiographs of the right foot are obtained intraoperatively.  These show interval arthrodesis of the hallux MP joint and resection of the bunion deformity.  Second and third metatarsals are shortened and second hammertoe was corrected.  Hardware is appropriately positioned and of the appropriate lengths.  No other acute injuries are noted.

## 2019-02-26 NOTE — Anesthesia Procedure Notes (Signed)
Anesthesia Regional Block: Adductor canal block   Pre-Anesthetic Checklist: ,, timeout performed, Correct Patient, Correct Site, Correct Laterality, Correct Procedure, Correct Position, site marked, Risks and benefits discussed,  Surgical consent,  Pre-op evaluation,  At surgeon's request and post-op pain management  Laterality: Right and Lower  Prep: Maximum Sterile Barrier Precautions used, chloraprep       Needles:  Injection technique: Single-shot  Needle Type: Echogenic Stimulator Needle     Needle Length: 10cm      Additional Needles:   Procedures:,,,, ultrasound used (permanent image in chart),,,,  Narrative:  Start time: 02/26/2019 7:11 AM End time: 02/26/2019 7:15 AM Injection made incrementally with aspirations every 5 mL.  Performed by: Personally  Anesthesiologist: Montez Hageman, MD  Additional Notes: Risks, benefits and alternative to block explained extensively.  Patient tolerated procedure well, without complications.

## 2019-02-26 NOTE — Anesthesia Procedure Notes (Signed)
Procedure Name: LMA Insertion Date/Time: 02/26/2019 7:38 AM Performed by: Aldwin Micalizzi, Ernesta Amble, CRNA Pre-anesthesia Checklist: Patient identified, Emergency Drugs available, Suction available and Patient being monitored Patient Re-evaluated:Patient Re-evaluated prior to induction Oxygen Delivery Method: Circle system utilized Preoxygenation: Pre-oxygenation with 100% oxygen Induction Type: IV induction Ventilation: Mask ventilation without difficulty LMA: LMA inserted LMA Size: 4.0 Number of attempts: 1 Airway Equipment and Method: Bite block Placement Confirmation: positive ETCO2 Tube secured with: Tape Dental Injury: Teeth and Oropharynx as per pre-operative assessment

## 2019-02-26 NOTE — H&P (Signed)
[name] is an 68 y.o. female.   Chief Complaint:  Right foot pain HPI: The patient is a 68 year old female with a long history of right forefoot pain.  She has a chronic bunion deformity with hallux rigidus and metatarsalgia with second and third hammertoe deformities.  She presents now for operative treatment of these painful and limiting conditions.  Past Medical History:  Diagnosis Date  . Anxiety    long time ago- 1980, has one panic attack    . Arthritis    degenerative, shoulders, both   . Asthma    seasonal - randomly used inhaler , "never consistently"    . Chronic kidney disease    one kidney- Left /w blockage, removed, now with one kidney   . Complication of anesthesia   . GERD (gastroesophageal reflux disease)   . Headache    no meds, just rest & dark , quiet spot   . Heart murmur   . PONV (postoperative nausea and vomiting)   . Tachycardia    back in 1970's, rec'd care for increased heartrate here in [place], was on a betablocker     Past Surgical History:  Procedure Laterality Date  . BREAST SURGERY Right 1995   biopsy-  . CHOLECYSTECTOMY    . ERCP  10/2009   normal - abnormal IOC  . NEPHRECTOMY    . R hand- wrist     surgery- for cysts & also tendon repair   . REVERSE SHOULDER ARTHROPLASTY Left 09/03/2014   Procedure: LEFT SHOULDER REVERSE ARTHROPLASTY;  Surgeon: Augustin Schooling, MD;  Location: [facility];  Service: Orthopedics;  Laterality: Left;  . TENDON REPAIR    . TONSILLECTOMY    . UPPER GASTROINTESTINAL ENDOSCOPY      Family History  Problem Relation Age of Onset  . Colon cancer Mother    Social History:  reports that she has never smoked. She has never used smokeless tobacco. She reports current alcohol use of about 1.0 standard drinks of alcohol per week. She reports that she does not use drugs.  Allergies: No Known Allergies  Medications Prior to Admission  Medication Sig Dispense Refill  . albuterol (VENTOLIN HFA) 108 (90 Base) MCG/ACT inhaler  Inhale into the lungs every 6 (six) hours as needed for wheezing or shortness of breath.    . calcium carbonate (TUMS - DOSED IN MG ELEMENTAL CALCIUM) 500 MG chewable tablet Chew 1 tablet by mouth daily.    . famotidine (PEPCID) 20 MG tablet Take 20 mg by mouth 2 (two) times daily.    . fluticasone (FLONASE) 50 MCG/ACT nasal spray Place into both nostrils daily.    Marland Kitchen gabapentin (NEURONTIN) 300 MG capsule Take 300 mg by mouth at bedtime.    Marland Kitchen loratadine (CLARITIN) 10 MG tablet Take 10 mg by mouth daily.    . Multiple Vitamins-Minerals (MULTIVITAMIN PO) Take 1 tablet by mouth daily.    . Vitamin D, Cholecalciferol, 1000 UNITS TABS Take 2,000 Units by mouth daily.      No results found for this or any previous visit (from the past 48 hour(s)). No results found.  ROS no recent fever, chills, nausea, vomiting or changes in her appetite  Blood pressure (!) 148/76, pulse 82, temperature 98.7 F (37.1 C), temperature source Oral, resp. rate 18, height 5' 4.5" (1.638 m), weight 85.8 kg, SpO2 100 %. Physical Exam  Well-nourished well-developed woman in no apparent distress.  Alert and oriented x4.  Mood and affect are normal.  Extraocular motions are intact.  Respirations are unlabored.  Gait is normal.  The right foot has a prominent bunion deformity with decreased range of motion of the hallux MP joint.  Skin is healthy and intact.  No lymphadenopathy.  Pulses are palpable.  5 out of 5 strength in plantarflexion and dorsiflexion of the toes.  Second and third toes are hammertoes.  Assessment/Plan Right foot bunion deformity, hallux rigidus, metatarsalgia and second and third hammertoe deformities -to the operating room today for surgical treatment for these painful and limiting forefoot deformities.  The risks and benefits of the alternative treatment options have been discussed in detail.  The patient wishes to proceed with surgery and specifically understands risks of bleeding, infection, nerve  damage, blood clots, need for additional surgery, amputation and death.   Wylene Simmer, MD 03-17-2019, 7:23 AM

## 2019-02-26 NOTE — Anesthesia Preprocedure Evaluation (Signed)
Anesthesia Evaluation  Patient identified by MRN, date of birth, ID band Patient awake    Reviewed: Allergy & Precautions, NPO status , Patient's Chart, lab work & pertinent test results  History of Anesthesia Complications (+) PONV  Airway Mallampati: II  TM Distance: >3 FB Neck ROM: Full    Dental no notable dental hx.    Pulmonary asthma ,    Pulmonary exam normal breath sounds clear to auscultation       Cardiovascular negative cardio ROS Normal cardiovascular exam Rhythm:Regular Rate:Normal     Neuro/Psych Anxiety negative psych ROS   GI/Hepatic Neg liver ROS, GERD  ,  Endo/Other  negative endocrine ROS  Renal/GU negative Renal ROS  negative genitourinary   Musculoskeletal negative musculoskeletal ROS (+)   Abdominal   Peds negative pediatric ROS (+)  Hematology negative hematology ROS (+)   Anesthesia Other Findings   Reproductive/Obstetrics negative OB ROS                             Anesthesia Physical Anesthesia Plan  ASA: II  Anesthesia Plan: General   Post-op Pain Management:  Regional for Post-op pain   Induction: Intravenous  PONV Risk Score and Plan:   Airway Management Planned: LMA  Additional Equipment:   Intra-op Plan:   Post-operative Plan:   Informed Consent: I have reviewed the patients History and Physical, chart, labs and discussed the procedure including the risks, benefits and alternatives for the proposed anesthesia with the patient or authorized representative who has indicated his/her understanding and acceptance.     Dental advisory given  Plan Discussed with: CRNA  Anesthesia Plan Comments:         Anesthesia Quick Evaluation

## 2019-02-26 NOTE — Progress Notes (Signed)
Assisted Dr. Marcell Barlow with right, ultrasound guided, popliteal, adductor canal block. Side rails up, monitors on throughout procedure. See vital signs in flow sheet. Tolerated Procedure well.

## 2019-02-27 ENCOUNTER — Encounter (HOSPITAL_BASED_OUTPATIENT_CLINIC_OR_DEPARTMENT_OTHER): Payer: Self-pay | Admitting: Orthopedic Surgery

## 2019-04-10 DIAGNOSIS — M21611 Bunion of right foot: Secondary | ICD-10-CM | POA: Diagnosis not present

## 2019-04-10 DIAGNOSIS — Z5189 Encounter for other specified aftercare: Secondary | ICD-10-CM | POA: Diagnosis not present

## 2019-04-10 DIAGNOSIS — M2041 Other hammer toe(s) (acquired), right foot: Secondary | ICD-10-CM | POA: Diagnosis not present

## 2019-04-10 DIAGNOSIS — M2021 Hallux rigidus, right foot: Secondary | ICD-10-CM | POA: Diagnosis not present

## 2019-04-10 DIAGNOSIS — M79671 Pain in right foot: Secondary | ICD-10-CM | POA: Diagnosis not present

## 2019-04-10 DIAGNOSIS — M7741 Metatarsalgia, right foot: Secondary | ICD-10-CM | POA: Diagnosis not present

## 2019-04-15 DIAGNOSIS — M25562 Pain in left knee: Secondary | ICD-10-CM | POA: Diagnosis not present

## 2019-04-15 DIAGNOSIS — M25561 Pain in right knee: Secondary | ICD-10-CM | POA: Diagnosis not present

## 2019-05-08 DIAGNOSIS — M2021 Hallux rigidus, right foot: Secondary | ICD-10-CM | POA: Diagnosis not present

## 2019-05-08 DIAGNOSIS — M79671 Pain in right foot: Secondary | ICD-10-CM | POA: Diagnosis not present

## 2019-05-08 DIAGNOSIS — M2041 Other hammer toe(s) (acquired), right foot: Secondary | ICD-10-CM | POA: Diagnosis not present

## 2019-05-08 DIAGNOSIS — Z5189 Encounter for other specified aftercare: Secondary | ICD-10-CM | POA: Diagnosis not present

## 2019-05-08 DIAGNOSIS — M21611 Bunion of right foot: Secondary | ICD-10-CM | POA: Diagnosis not present

## 2019-05-08 DIAGNOSIS — M7741 Metatarsalgia, right foot: Secondary | ICD-10-CM | POA: Diagnosis not present

## 2019-05-28 DIAGNOSIS — M205X1 Other deformities of toe(s) (acquired), right foot: Secondary | ICD-10-CM | POA: Diagnosis not present

## 2019-05-28 DIAGNOSIS — L02611 Cutaneous abscess of right foot: Secondary | ICD-10-CM | POA: Diagnosis not present

## 2019-05-28 DIAGNOSIS — L03031 Cellulitis of right toe: Secondary | ICD-10-CM | POA: Diagnosis not present

## 2019-05-28 DIAGNOSIS — M79674 Pain in right toe(s): Secondary | ICD-10-CM | POA: Diagnosis not present

## 2019-06-11 DIAGNOSIS — L03031 Cellulitis of right toe: Secondary | ICD-10-CM | POA: Diagnosis not present

## 2019-06-30 DIAGNOSIS — Z1231 Encounter for screening mammogram for malignant neoplasm of breast: Secondary | ICD-10-CM | POA: Diagnosis not present

## 2019-06-30 DIAGNOSIS — Z803 Family history of malignant neoplasm of breast: Secondary | ICD-10-CM | POA: Diagnosis not present

## 2019-07-01 DIAGNOSIS — J309 Allergic rhinitis, unspecified: Secondary | ICD-10-CM | POA: Diagnosis not present

## 2019-07-01 DIAGNOSIS — I700 Atherosclerosis of aorta: Secondary | ICD-10-CM | POA: Diagnosis not present

## 2019-07-01 DIAGNOSIS — R635 Abnormal weight gain: Secondary | ICD-10-CM | POA: Diagnosis not present

## 2019-07-01 DIAGNOSIS — G259 Extrapyramidal and movement disorder, unspecified: Secondary | ICD-10-CM | POA: Diagnosis not present

## 2019-07-01 DIAGNOSIS — J4520 Mild intermittent asthma, uncomplicated: Secondary | ICD-10-CM | POA: Diagnosis not present

## 2019-07-01 DIAGNOSIS — Z0000 Encounter for general adult medical examination without abnormal findings: Secondary | ICD-10-CM | POA: Diagnosis not present

## 2019-07-01 DIAGNOSIS — Z1389 Encounter for screening for other disorder: Secondary | ICD-10-CM | POA: Diagnosis not present

## 2019-07-01 DIAGNOSIS — Z905 Acquired absence of kidney: Secondary | ICD-10-CM | POA: Diagnosis not present

## 2019-07-01 DIAGNOSIS — G4700 Insomnia, unspecified: Secondary | ICD-10-CM | POA: Diagnosis not present

## 2019-07-01 DIAGNOSIS — R197 Diarrhea, unspecified: Secondary | ICD-10-CM | POA: Diagnosis not present

## 2019-07-01 DIAGNOSIS — R52 Pain, unspecified: Secondary | ICD-10-CM | POA: Diagnosis not present

## 2019-07-01 DIAGNOSIS — M8580 Other specified disorders of bone density and structure, unspecified site: Secondary | ICD-10-CM | POA: Diagnosis not present

## 2019-07-01 DIAGNOSIS — E559 Vitamin D deficiency, unspecified: Secondary | ICD-10-CM | POA: Diagnosis not present

## 2019-07-01 DIAGNOSIS — R030 Elevated blood-pressure reading, without diagnosis of hypertension: Secondary | ICD-10-CM | POA: Diagnosis not present

## 2019-07-21 DIAGNOSIS — M1712 Unilateral primary osteoarthritis, left knee: Secondary | ICD-10-CM | POA: Diagnosis not present

## 2019-07-21 DIAGNOSIS — M170 Bilateral primary osteoarthritis of knee: Secondary | ICD-10-CM | POA: Diagnosis not present

## 2019-07-21 DIAGNOSIS — M1711 Unilateral primary osteoarthritis, right knee: Secondary | ICD-10-CM | POA: Diagnosis not present

## 2019-08-13 DIAGNOSIS — U071 COVID-19: Secondary | ICD-10-CM | POA: Diagnosis not present

## 2019-08-13 DIAGNOSIS — R05 Cough: Secondary | ICD-10-CM | POA: Diagnosis not present

## 2019-08-17 DIAGNOSIS — Z9189 Other specified personal risk factors, not elsewhere classified: Secondary | ICD-10-CM | POA: Diagnosis not present

## 2019-08-17 DIAGNOSIS — U071 COVID-19: Secondary | ICD-10-CM | POA: Diagnosis not present

## 2019-08-17 DIAGNOSIS — R0981 Nasal congestion: Secondary | ICD-10-CM | POA: Diagnosis not present

## 2019-09-14 ENCOUNTER — Ambulatory Visit: Payer: PPO | Attending: Internal Medicine

## 2019-09-14 DIAGNOSIS — Z23 Encounter for immunization: Secondary | ICD-10-CM

## 2019-09-14 NOTE — Progress Notes (Signed)
   Covid-19 Vaccination Clinic  Name:  [name]    MRN: [id number] DOB: 01/29/1951  09/14/2019  [name] was observed post Covid-19 immunization for 15 minutes without incidence. She was provided with Vaccine Information Sheet and instruction to access the V-Safe system.   [name] was instructed to call 911 with any severe reactions post vaccine: Marland Kitchen Difficulty breathing  . Swelling of your face and throat  . A fast heartbeat  . A bad rash all over your body  . Dizziness and weakness    Immunizations Administered    Name Date Dose VIS Date Route   Pfizer COVID-19 Vaccine 09/14/2019  6:16 PM 0.3 mL 07/17/2019 Intramuscular   Manufacturer: [place]   Lot: [place] [id number]   [place]: [id number]

## 2019-10-09 ENCOUNTER — Ambulatory Visit: Payer: PPO | Attending: Internal Medicine

## 2019-10-09 DIAGNOSIS — Z23 Encounter for immunization: Secondary | ICD-10-CM

## 2019-10-09 NOTE — Progress Notes (Signed)
   Covid-19 Vaccination Clinic  Name:  [name]    MRN: [id number] DOB: 1951/01/27  10/09/2019  [name] was observed post Covid-19 immunization for 15 minutes without incident. She was provided with Vaccine Information Sheet and instruction to access the V-Safe system.   [name] was instructed to call 911 with any severe reactions post vaccine: Marland Kitchen Difficulty breathing  . Swelling of face and throat  . A fast heartbeat  . A bad rash all over body  . Dizziness and weakness   Immunizations Administered    Name Date Dose VIS Date Route   Pfizer COVID-19 Vaccine 10/09/2019  6:03 PM 0.3 mL 07/17/2019 Intramuscular   Manufacturer: [place]   Lot: [id number]   [place]: [id number]

## 2019-10-20 DIAGNOSIS — M1711 Unilateral primary osteoarthritis, right knee: Secondary | ICD-10-CM | POA: Diagnosis not present

## 2019-10-20 DIAGNOSIS — M170 Bilateral primary osteoarthritis of knee: Secondary | ICD-10-CM | POA: Diagnosis not present

## 2019-10-20 DIAGNOSIS — M1712 Unilateral primary osteoarthritis, left knee: Secondary | ICD-10-CM | POA: Diagnosis not present

## 2019-12-09 DIAGNOSIS — R6883 Chills (without fever): Secondary | ICD-10-CM | POA: Diagnosis not present

## 2019-12-09 DIAGNOSIS — R109 Unspecified abdominal pain: Secondary | ICD-10-CM | POA: Diagnosis not present

## 2019-12-15 ENCOUNTER — Other Ambulatory Visit: Payer: Self-pay | Admitting: Internal Medicine

## 2019-12-15 DIAGNOSIS — M545 Low back pain: Secondary | ICD-10-CM | POA: Diagnosis not present

## 2019-12-15 DIAGNOSIS — S336XXA Sprain of sacroiliac joint, initial encounter: Secondary | ICD-10-CM | POA: Diagnosis not present

## 2019-12-15 DIAGNOSIS — M5432 Sciatica, left side: Secondary | ICD-10-CM | POA: Diagnosis not present

## 2019-12-15 DIAGNOSIS — M9903 Segmental and somatic dysfunction of lumbar region: Secondary | ICD-10-CM | POA: Diagnosis not present

## 2019-12-16 DIAGNOSIS — M545 Low back pain: Secondary | ICD-10-CM | POA: Diagnosis not present

## 2019-12-16 DIAGNOSIS — M9903 Segmental and somatic dysfunction of lumbar region: Secondary | ICD-10-CM | POA: Diagnosis not present

## 2019-12-16 DIAGNOSIS — M5432 Sciatica, left side: Secondary | ICD-10-CM | POA: Diagnosis not present

## 2019-12-16 DIAGNOSIS — S336XXA Sprain of sacroiliac joint, initial encounter: Secondary | ICD-10-CM | POA: Diagnosis not present

## 2019-12-17 DIAGNOSIS — M5432 Sciatica, left side: Secondary | ICD-10-CM | POA: Diagnosis not present

## 2019-12-17 DIAGNOSIS — S336XXA Sprain of sacroiliac joint, initial encounter: Secondary | ICD-10-CM | POA: Diagnosis not present

## 2019-12-17 DIAGNOSIS — M545 Low back pain: Secondary | ICD-10-CM | POA: Diagnosis not present

## 2019-12-17 DIAGNOSIS — M9903 Segmental and somatic dysfunction of lumbar region: Secondary | ICD-10-CM | POA: Diagnosis not present

## 2019-12-18 DIAGNOSIS — R109 Unspecified abdominal pain: Secondary | ICD-10-CM | POA: Diagnosis not present

## 2019-12-31 DIAGNOSIS — M170 Bilateral primary osteoarthritis of knee: Secondary | ICD-10-CM | POA: Diagnosis not present

## 2019-12-31 DIAGNOSIS — M1711 Unilateral primary osteoarthritis, right knee: Secondary | ICD-10-CM | POA: Diagnosis not present

## 2019-12-31 DIAGNOSIS — M1712 Unilateral primary osteoarthritis, left knee: Secondary | ICD-10-CM | POA: Diagnosis not present

## 2020-01-05 DIAGNOSIS — M79605 Pain in left leg: Secondary | ICD-10-CM | POA: Diagnosis not present

## 2020-01-26 DIAGNOSIS — I8312 Varicose veins of left lower extremity with inflammation: Secondary | ICD-10-CM | POA: Diagnosis not present

## 2020-01-26 DIAGNOSIS — M79605 Pain in left leg: Secondary | ICD-10-CM | POA: Diagnosis not present

## 2020-02-03 DIAGNOSIS — M79605 Pain in left leg: Secondary | ICD-10-CM | POA: Diagnosis not present

## 2020-04-28 DIAGNOSIS — M170 Bilateral primary osteoarthritis of knee: Secondary | ICD-10-CM | POA: Diagnosis not present

## 2020-04-28 DIAGNOSIS — M1712 Unilateral primary osteoarthritis, left knee: Secondary | ICD-10-CM | POA: Diagnosis not present

## 2020-04-28 DIAGNOSIS — M1711 Unilateral primary osteoarthritis, right knee: Secondary | ICD-10-CM | POA: Diagnosis not present

## 2020-05-05 DIAGNOSIS — M25561 Pain in right knee: Secondary | ICD-10-CM | POA: Diagnosis not present

## 2020-05-05 DIAGNOSIS — M170 Bilateral primary osteoarthritis of knee: Secondary | ICD-10-CM | POA: Diagnosis not present

## 2020-05-05 DIAGNOSIS — M25562 Pain in left knee: Secondary | ICD-10-CM | POA: Diagnosis not present

## 2020-05-12 DIAGNOSIS — M170 Bilateral primary osteoarthritis of knee: Secondary | ICD-10-CM | POA: Diagnosis not present

## 2020-05-12 DIAGNOSIS — M25561 Pain in right knee: Secondary | ICD-10-CM | POA: Diagnosis not present

## 2020-05-12 DIAGNOSIS — M25562 Pain in left knee: Secondary | ICD-10-CM | POA: Diagnosis not present

## 2020-06-01 DIAGNOSIS — M25562 Pain in left knee: Secondary | ICD-10-CM | POA: Diagnosis not present

## 2020-06-01 DIAGNOSIS — M25561 Pain in right knee: Secondary | ICD-10-CM | POA: Diagnosis not present

## 2020-06-13 DIAGNOSIS — M545 Low back pain, unspecified: Secondary | ICD-10-CM | POA: Diagnosis not present

## 2020-07-05 DIAGNOSIS — Z1231 Encounter for screening mammogram for malignant neoplasm of breast: Secondary | ICD-10-CM | POA: Diagnosis not present

## 2020-07-15 DIAGNOSIS — Z0000 Encounter for general adult medical examination without abnormal findings: Secondary | ICD-10-CM | POA: Diagnosis not present

## 2020-07-15 DIAGNOSIS — Z905 Acquired absence of kidney: Secondary | ICD-10-CM | POA: Diagnosis not present

## 2020-07-15 DIAGNOSIS — R52 Pain, unspecified: Secondary | ICD-10-CM | POA: Diagnosis not present

## 2020-07-15 DIAGNOSIS — G259 Extrapyramidal and movement disorder, unspecified: Secondary | ICD-10-CM | POA: Diagnosis not present

## 2020-07-15 DIAGNOSIS — J4520 Mild intermittent asthma, uncomplicated: Secondary | ICD-10-CM | POA: Diagnosis not present

## 2020-07-15 DIAGNOSIS — Z0001 Encounter for general adult medical examination with abnormal findings: Secondary | ICD-10-CM | POA: Diagnosis not present

## 2020-07-15 DIAGNOSIS — G4700 Insomnia, unspecified: Secondary | ICD-10-CM | POA: Diagnosis not present

## 2020-07-15 DIAGNOSIS — J309 Allergic rhinitis, unspecified: Secondary | ICD-10-CM | POA: Diagnosis not present

## 2020-07-15 DIAGNOSIS — I700 Atherosclerosis of aorta: Secondary | ICD-10-CM | POA: Diagnosis not present

## 2020-07-15 DIAGNOSIS — E559 Vitamin D deficiency, unspecified: Secondary | ICD-10-CM | POA: Diagnosis not present

## 2020-07-15 DIAGNOSIS — M8580 Other specified disorders of bone density and structure, unspecified site: Secondary | ICD-10-CM | POA: Diagnosis not present

## 2020-07-15 DIAGNOSIS — K5730 Diverticulosis of large intestine without perforation or abscess without bleeding: Secondary | ICD-10-CM | POA: Diagnosis not present

## 2020-07-20 DIAGNOSIS — R928 Other abnormal and inconclusive findings on diagnostic imaging of breast: Secondary | ICD-10-CM | POA: Diagnosis not present

## 2020-07-20 DIAGNOSIS — R922 Inconclusive mammogram: Secondary | ICD-10-CM | POA: Diagnosis not present

## 2020-08-18 DIAGNOSIS — M25561 Pain in right knee: Secondary | ICD-10-CM | POA: Diagnosis not present

## 2020-08-18 DIAGNOSIS — M25562 Pain in left knee: Secondary | ICD-10-CM | POA: Diagnosis not present

## 2020-09-13 DIAGNOSIS — J0101 Acute recurrent maxillary sinusitis: Secondary | ICD-10-CM | POA: Diagnosis not present

## 2020-09-30 DIAGNOSIS — M85852 Other specified disorders of bone density and structure, left thigh: Secondary | ICD-10-CM | POA: Diagnosis not present

## 2020-09-30 DIAGNOSIS — M85851 Other specified disorders of bone density and structure, right thigh: Secondary | ICD-10-CM | POA: Diagnosis not present

## 2020-10-18 DIAGNOSIS — M25562 Pain in left knee: Secondary | ICD-10-CM | POA: Diagnosis not present

## 2020-10-18 DIAGNOSIS — M25561 Pain in right knee: Secondary | ICD-10-CM | POA: Diagnosis not present

## 2020-10-24 DIAGNOSIS — N281 Cyst of kidney, acquired: Secondary | ICD-10-CM | POA: Diagnosis not present

## 2020-10-25 DIAGNOSIS — S336XXA Sprain of sacroiliac joint, initial encounter: Secondary | ICD-10-CM | POA: Diagnosis not present

## 2020-10-25 DIAGNOSIS — M5432 Sciatica, left side: Secondary | ICD-10-CM | POA: Diagnosis not present

## 2020-10-25 DIAGNOSIS — M9903 Segmental and somatic dysfunction of lumbar region: Secondary | ICD-10-CM | POA: Diagnosis not present

## 2020-10-25 DIAGNOSIS — M9902 Segmental and somatic dysfunction of thoracic region: Secondary | ICD-10-CM | POA: Diagnosis not present

## 2020-10-26 DIAGNOSIS — M9903 Segmental and somatic dysfunction of lumbar region: Secondary | ICD-10-CM | POA: Diagnosis not present

## 2020-10-26 DIAGNOSIS — S336XXA Sprain of sacroiliac joint, initial encounter: Secondary | ICD-10-CM | POA: Diagnosis not present

## 2020-10-26 DIAGNOSIS — M9902 Segmental and somatic dysfunction of thoracic region: Secondary | ICD-10-CM | POA: Diagnosis not present

## 2020-10-26 DIAGNOSIS — M5432 Sciatica, left side: Secondary | ICD-10-CM | POA: Diagnosis not present

## 2020-10-27 DIAGNOSIS — M9903 Segmental and somatic dysfunction of lumbar region: Secondary | ICD-10-CM | POA: Diagnosis not present

## 2020-10-27 DIAGNOSIS — S336XXA Sprain of sacroiliac joint, initial encounter: Secondary | ICD-10-CM | POA: Diagnosis not present

## 2020-10-27 DIAGNOSIS — M9902 Segmental and somatic dysfunction of thoracic region: Secondary | ICD-10-CM | POA: Diagnosis not present

## 2020-10-27 DIAGNOSIS — M5432 Sciatica, left side: Secondary | ICD-10-CM | POA: Diagnosis not present

## 2020-10-31 DIAGNOSIS — S336XXA Sprain of sacroiliac joint, initial encounter: Secondary | ICD-10-CM | POA: Diagnosis not present

## 2020-10-31 DIAGNOSIS — M9902 Segmental and somatic dysfunction of thoracic region: Secondary | ICD-10-CM | POA: Diagnosis not present

## 2020-10-31 DIAGNOSIS — M9903 Segmental and somatic dysfunction of lumbar region: Secondary | ICD-10-CM | POA: Diagnosis not present

## 2020-10-31 DIAGNOSIS — M5432 Sciatica, left side: Secondary | ICD-10-CM | POA: Diagnosis not present

## 2020-11-02 DIAGNOSIS — M9902 Segmental and somatic dysfunction of thoracic region: Secondary | ICD-10-CM | POA: Diagnosis not present

## 2020-11-02 DIAGNOSIS — M9903 Segmental and somatic dysfunction of lumbar region: Secondary | ICD-10-CM | POA: Diagnosis not present

## 2020-11-02 DIAGNOSIS — S336XXA Sprain of sacroiliac joint, initial encounter: Secondary | ICD-10-CM | POA: Diagnosis not present

## 2020-11-02 DIAGNOSIS — M5432 Sciatica, left side: Secondary | ICD-10-CM | POA: Diagnosis not present

## 2020-11-03 DIAGNOSIS — M5432 Sciatica, left side: Secondary | ICD-10-CM | POA: Diagnosis not present

## 2020-11-03 DIAGNOSIS — M9903 Segmental and somatic dysfunction of lumbar region: Secondary | ICD-10-CM | POA: Diagnosis not present

## 2020-11-03 DIAGNOSIS — S336XXA Sprain of sacroiliac joint, initial encounter: Secondary | ICD-10-CM | POA: Diagnosis not present

## 2020-11-03 DIAGNOSIS — M9902 Segmental and somatic dysfunction of thoracic region: Secondary | ICD-10-CM | POA: Diagnosis not present

## 2020-11-07 DIAGNOSIS — M5432 Sciatica, left side: Secondary | ICD-10-CM | POA: Diagnosis not present

## 2020-11-07 DIAGNOSIS — S336XXA Sprain of sacroiliac joint, initial encounter: Secondary | ICD-10-CM | POA: Diagnosis not present

## 2020-11-07 DIAGNOSIS — M9902 Segmental and somatic dysfunction of thoracic region: Secondary | ICD-10-CM | POA: Diagnosis not present

## 2020-11-07 DIAGNOSIS — M9903 Segmental and somatic dysfunction of lumbar region: Secondary | ICD-10-CM | POA: Diagnosis not present

## 2020-11-10 DIAGNOSIS — M5432 Sciatica, left side: Secondary | ICD-10-CM | POA: Diagnosis not present

## 2020-11-10 DIAGNOSIS — M9902 Segmental and somatic dysfunction of thoracic region: Secondary | ICD-10-CM | POA: Diagnosis not present

## 2020-11-10 DIAGNOSIS — M9903 Segmental and somatic dysfunction of lumbar region: Secondary | ICD-10-CM | POA: Diagnosis not present

## 2020-11-10 DIAGNOSIS — S336XXA Sprain of sacroiliac joint, initial encounter: Secondary | ICD-10-CM | POA: Diagnosis not present

## 2020-11-17 DIAGNOSIS — M5432 Sciatica, left side: Secondary | ICD-10-CM | POA: Diagnosis not present

## 2020-11-17 DIAGNOSIS — S336XXA Sprain of sacroiliac joint, initial encounter: Secondary | ICD-10-CM | POA: Diagnosis not present

## 2020-11-17 DIAGNOSIS — M9903 Segmental and somatic dysfunction of lumbar region: Secondary | ICD-10-CM | POA: Diagnosis not present

## 2020-11-17 DIAGNOSIS — M9902 Segmental and somatic dysfunction of thoracic region: Secondary | ICD-10-CM | POA: Diagnosis not present

## 2020-11-22 DIAGNOSIS — S336XXA Sprain of sacroiliac joint, initial encounter: Secondary | ICD-10-CM | POA: Diagnosis not present

## 2020-11-22 DIAGNOSIS — M5432 Sciatica, left side: Secondary | ICD-10-CM | POA: Diagnosis not present

## 2020-11-22 DIAGNOSIS — M9902 Segmental and somatic dysfunction of thoracic region: Secondary | ICD-10-CM | POA: Diagnosis not present

## 2020-11-22 DIAGNOSIS — M9903 Segmental and somatic dysfunction of lumbar region: Secondary | ICD-10-CM | POA: Diagnosis not present

## 2020-11-24 DIAGNOSIS — M9903 Segmental and somatic dysfunction of lumbar region: Secondary | ICD-10-CM | POA: Diagnosis not present

## 2020-11-24 DIAGNOSIS — S336XXA Sprain of sacroiliac joint, initial encounter: Secondary | ICD-10-CM | POA: Diagnosis not present

## 2020-11-24 DIAGNOSIS — M9902 Segmental and somatic dysfunction of thoracic region: Secondary | ICD-10-CM | POA: Diagnosis not present

## 2020-11-24 DIAGNOSIS — M5432 Sciatica, left side: Secondary | ICD-10-CM | POA: Diagnosis not present

## 2020-11-29 NOTE — H&P (Signed)
Patient's anticipated LOS is less than 2 midnights, meeting these requirements: - Younger than 69 - Lives within 1 hour of care - Has a competent adult at home to recover with post-op recover - NO history of  - Chronic pain requiring opiods  - Diabetes  - Coronary Artery Disease  - Heart failure  - Heart attack  - Stroke  - DVT/VTE  - Cardiac arrhythmia  - Respiratory Failure/COPD  - Renal failure  - Anemia  - Advanced Liver disease       [name] is an 70 y.o. female.    Chief Complaint: right knee pain  HPI: Pt is a 70 y.o. female complaining of right knee pain for multiple years. Pain had continually increased since the beginning. X-rays in the clinic show end-stage arthritic changes of the right knee. Pt has tried various conservative treatments which have failed to alleviate their symptoms, including injections and therapy. Various options are discussed with the patient. Risks, benefits and expectations were discussed with the patient. Patient understand the risks, benefits and expectations and wishes to proceed with surgery.   PCP:  Josetta Huddle, MD  D/C Plans: Home  PMH: Past Medical History:  Diagnosis Date  . Anxiety    long time ago- 1980, has one panic attack    . Arthritis    degenerative, shoulders, both   . Asthma    seasonal - randomly used inhaler , "never consistently"    . Chronic kidney disease    one kidney- Left /w blockage, removed, now with one kidney   . Complication of anesthesia   . GERD (gastroesophageal reflux disease)   . Headache    no meds, just rest & dark , quiet spot   . Heart murmur   . PONV (postoperative nausea and vomiting)   . Tachycardia    back in 1970's, rec'd care for increased heartrate here in [place], was on a betablocker     PSH: Past Surgical History:  Procedure Laterality Date  . ARTHRODESIS METATARSALPHALANGEAL JOINT (MTPJ) Right 02/26/2019   Procedure: Right silver bunionectomy, hallux metatarsal  phalangeal joint arthrodesis;  Surgeon: Wylene Simmer, MD;  Location: [facility];  Service: Orthopedics;  Laterality: Right;  . BREAST SURGERY Right 1995   biopsy-  . CHOLECYSTECTOMY    . ERCP  10/2009   normal - abnormal IOC  . HAMMERTOE RECONSTRUCTION WITH WEIL OSTEOTOMY Right 02/26/2019   Procedure: Second and Third Weil osteotomy; Second hammertoe correction;  Surgeon: Wylene Simmer, MD;  Location: [facility];  Service: Orthopedics;  Laterality: Right;  . NEPHRECTOMY    . R hand- wrist     surgery- for cysts & also tendon repair   . REVERSE SHOULDER ARTHROPLASTY Left 09/03/2014   Procedure: LEFT SHOULDER REVERSE ARTHROPLASTY;  Surgeon: Augustin Schooling, MD;  Location: [facility];  Service: Orthopedics;  Laterality: Left;  . TENDON REPAIR    . TONSILLECTOMY    . UPPER GASTROINTESTINAL ENDOSCOPY      Social History:  reports that she has never smoked. She has never used smokeless tobacco. She reports current alcohol use of about 1.0 standard drink of alcohol per week. She reports that she does not use drugs.  Allergies:  No Known Allergies  Medications: No current facility-administered medications for this encounter.   Current Outpatient Medications  Medication Sig Dispense Refill  . albuterol (VENTOLIN HFA) 108 (90 Base) MCG/ACT inhaler Inhale 2 puffs into the lungs every 6 (six) hours as needed  for wheezing or shortness of breath.    . calcium carbonate (TUMS - DOSED IN MG ELEMENTAL CALCIUM) 500 MG chewable tablet Chew 1 tablet by mouth daily as needed for heartburn or indigestion.    . famotidine (PEPCID) 20 MG tablet Take 20 mg by mouth daily as needed for heartburn or indigestion.    . fluticasone (FLONASE) 50 MCG/ACT nasal spray Place 1 spray into both nostrils daily as needed for allergies.    Marland Kitchen gabapentin (NEURONTIN) 300 MG capsule Take 600 mg by mouth at bedtime.    Marland Kitchen loratadine (CLARITIN) 10 MG tablet Take 10 mg by mouth daily as needed for allergies.     . Multiple Vitamins-Minerals (MULTIVITAMIN PO) Take 1 tablet by mouth daily.    . Vitamin D, Cholecalciferol, 1000 UNITS TABS Take 1,000 Units by mouth daily.      No results found for this or any previous visit (from the past 48 hour(s)). No results found.  ROS: Pain with rom of the right lower extremity  Physical Exam: Alert and oriented 70 y.o. female in no acute distress Cranial nerves 2-12 intact Cervical spine: full rom with no tenderness, nv intact distally Chest: active breath sounds bilaterally, no wheeze rhonchi or rales Heart: regular rate and rhythm, no murmur Abd: non tender non distended with active bowel sounds Hip is stable with rom  Right knee painful rom nv intact distally Antalgic gait No rashes or edema distally  Assessment/Plan Assessment: right knee end stage osteoarthritis  Plan:  Patient will undergo a right total knee  by Dr. Veverly Fells at [facility] Risks benefits and expectations were discussed with the patient. Patient understand risks, benefits and expectations and wishes to proceed. Preoperative templating of the joint replacement has been completed, documented, and submitted to the Operating Room personnel in order to optimize intra-operative equipment management.   Merla Riches PA-C, [facility] is now [facility] [address], [place], [place], [place] [address] Phone: [phone] [email or web address] Facebook  [employer]

## 2020-11-29 NOTE — Patient Instructions (Addendum)
DUE TO COVID-19 ONLY ONE VISITOR IS ALLOWED TO COME WITH YOU AND STAY IN THE WAITING ROOM ONLY DURING PRE OP AND PROCEDURE DAY OF SURGERY. THE 1 VISITOR  MAY VISIT WITH YOU AFTER SURGERY IN YOUR PRIVATE ROOM DURING VISITING HOURS ONLY!  YOU NEED TO HAVE A COVID 19 TEST ON: 12/06/20 @ 2:00 PM, THIS TEST MUST BE DONE BEFORE SURGERY,  COVID TESTING SITE [place] [address], IT IS ON THE RIGHT GOING OUT [address] APPROXIMATELY  2 MINUTES PAST [employer] ON THE RIGHT. ONCE YOUR COVID TEST IS COMPLETED,  PLEASE BEGIN THE QUARANTINE INSTRUCTIONS AS OUTLINED IN YOUR HANDOUT.                Hattie Perch   Your procedure is scheduled on: 12/09/20   Report to [facility] Main  Entrance   Report to admitting at: 11:00 AM     Call this number if you have problems the morning of surgery [phone]    Remember:  NO SOLID FOOD AFTER MIDNIGHT THE NIGHT PRIOR TO SURGERY. NOTHING BY MOUTH EXCEPT CLEAR LIQUIDS UNTIL: 10:30 AM . PLEASE FINISH ENSURE DRINK PER SURGEON ORDER  WHICH NEEDS TO BE COMPLETED AT: 10:30 AM .  CLEAR LIQUID DIET   Foods Allowed                                                                     Foods Excluded  Coffee and tea, regular and decaf                             liquids that you cannot  Plain Jell-O any favor except red or purple                                           see through such as: Fruit ices (not with fruit pulp)                                     milk, soups, orange juice  Iced Popsicles                                    All solid food Carbonated beverages, regular and diet                                    Cranberry, grape and apple juices Sports drinks like Gatorade Lightly seasoned clear broth or consume(fat free) Sugar, honey syrup  Sample Menu Breakfast                                Lunch  Supper Cranberry juice                    Beef broth                             Chicken broth Jell-O                                     Grape juice                           Apple juice Coffee or tea                        Jell-O                                      Popsicle                                                Coffee or tea                        Coffee or tea  _____________________________________________________________________   BRUSH YOUR TEETH MORNING OF SURGERY AND RINSE YOUR MOUTH OUT, NO CHEWING GUM CANDY OR MINTS.    Take these medicines the morning of surgery with A SIP OF WATER: famotidine.                               You may not have any metal on your body including hair pins and              piercings  Do not wear jewelry, make-up, lotions, powders or perfumes, deodorant             Do not wear nail polish on your fingernails.  Do not shave  48 hours prior to surgery.    Do not bring valuables to the hospital. [place].  Contacts, dentures or bridgework may not be worn into surgery.  Leave suitcase in the car. After surgery it may be brought to your room.     Patients discharged the day of surgery will not be allowed to drive home. IF YOU ARE HAVING SURGERY AND GOING HOME THE SAME DAY, YOU MUST HAVE AN ADULT TO DRIVE YOU HOME AND BE WITH YOU FOR 24 HOURS. YOU MAY GO HOME BY TAXI OR UBER OR ORTHERWISE, BUT AN ADULT MUST ACCOMPANY YOU HOME AND STAY WITH YOU FOR 24 HOURS.  Name and phone number of your driver:  Special Instructions: N/A              Please read over the following fact sheets you were given: _____________________________________________________________________         [facility] - Preparing for Surgery Before surgery, you can play an important role.  Because skin is not sterile, your skin needs to be as free of germs as possible.  You can  reduce the number of germs on your skin by washing with CHG (chlorahexidine gluconate) soap before surgery.  CHG is an antiseptic cleaner  which kills germs and bonds with the skin to continue killing germs even after washing. Please DO NOT use if you have an allergy to CHG or antibacterial soaps.  If your skin becomes reddened/irritated stop using the CHG and inform your nurse when you arrive at Short Stay. Do not shave (including legs and underarms) for at least 48 hours prior to the first CHG shower.  You may shave your face/neck. Please follow these instructions carefully:  1.  Shower with CHG Soap the night before surgery and the  morning of Surgery.  2.  If you choose to wash your hair, wash your hair first as usual with your  normal  shampoo.  3.  After you shampoo, rinse your hair and body thoroughly to remove the  shampoo.                           4.  Use CHG as you would any other liquid soap.  You can apply chg directly  to the skin and wash                       Gently with a scrungie or clean washcloth.  5.  Apply the CHG Soap to your body ONLY FROM THE NECK DOWN.   Do not use on face/ open                           Wound or open sores. Avoid contact with eyes, ears mouth and genitals (private parts).                       Wash face,  Genitals (private parts) with your normal soap.             6.  Wash thoroughly, paying special attention to the area where your surgery  will be performed.  7.  Thoroughly rinse your body with warm water from the neck down.  8.  DO NOT shower/wash with your normal soap after using and rinsing off  the CHG Soap.                9.  Pat yourself dry with a clean towel.            10.  Wear clean pajamas.            11.  Place clean sheets on your bed the night of your first shower and do not  sleep with pets. Day of Surgery : Do not apply any lotions/deodorants the morning of surgery.  Please wear clean clothes to the hospital/surgery center.  FAILURE TO FOLLOW THESE INSTRUCTIONS MAY RESULT IN THE CANCELLATION OF YOUR SURGERY PATIENT SIGNATURE_________________________________  NURSE  SIGNATURE__________________________________  ________________________________________________________________________   Adam Phenix  An incentive spirometer is a tool that can help keep your lungs clear and active. This tool measures how well you are filling your lungs with each breath. Taking long deep breaths may help reverse or decrease the chance of developing breathing (pulmonary) problems (especially infection) following:  A long period of time when you are unable to move or be active. BEFORE THE PROCEDURE   If the spirometer includes an indicator to show your best effort, your nurse or respiratory therapist will set it  to a desired goal.  If possible, sit up straight or lean slightly forward. Try not to slouch.  Hold the incentive spirometer in an upright position. INSTRUCTIONS FOR USE  1. Sit on the edge of your bed if possible, or sit up as far as you can in bed or on a chair. 2. Hold the incentive spirometer in an upright position. 3. Breathe out normally. 4. Place the mouthpiece in your mouth and seal your lips tightly around it. 5. Breathe in slowly and as deeply as possible, raising the piston or the ball toward the top of the column. 6. Hold your breath for 3-5 seconds or for as long as possible. Allow the piston or ball to fall to the bottom of the column. 7. Remove the mouthpiece from your mouth and breathe out normally. 8. Rest for a few seconds and repeat Steps 1 through 7 at least 10 times every 1-2 hours when you are awake. Take your time and take a few normal breaths between deep breaths. 9. The spirometer may include an indicator to show your best effort. Use the indicator as a goal to work toward during each repetition. 10. After each set of 10 deep breaths, practice coughing to be sure your lungs are clear. If you have an incision (the cut made at the time of surgery), support your incision when coughing by placing a pillow or rolled up towels firmly  against it. Once you are able to get out of bed, walk around indoors and cough well. You may stop using the incentive spirometer when instructed by your caregiver.  RISKS AND COMPLICATIONS  Take your time so you do not get dizzy or light-headed.  If you are in pain, you may need to take or ask for pain medication before doing incentive spirometry. It is harder to take a deep breath if you are having pain. AFTER USE  Rest and breathe slowly and easily.  It can be helpful to keep track of a log of your progress. Your caregiver can provide you with a simple table to help with this. If you are using the spirometer at home, follow these instructions: [place] IF:   You are having difficultly using the spirometer.  You have trouble using the spirometer as often as instructed.  Your pain medication is not giving enough relief while using the spirometer.  You develop fever of 100.5 F (38.1 C) or higher. SEEK IMMEDIATE MEDICAL CARE IF:   You cough up bloody sputum that had not been present before.  You develop fever of 102 F (38.9 C) or greater.  You develop worsening pain at or near the incision site. MAKE SURE YOU:   Understand these instructions.  Will watch your condition.  Will get help right away if you are not doing well or get worse. Document Released: 12/03/2006 Document Revised: 10/15/2011 Document Reviewed: 02/03/2007 [facility] Patient Information 2014 [place], [place].   ________________________________________________________________________

## 2020-11-30 ENCOUNTER — Other Ambulatory Visit: Payer: Self-pay

## 2020-11-30 ENCOUNTER — Encounter (HOSPITAL_COMMUNITY)
Admission: RE | Admit: 2020-11-30 | Discharge: 2020-11-30 | Disposition: A | Discharge status: Home / Self Care | Payer: PPO | Source: Ambulatory Visit | Attending: Orthopedic Surgery | Admitting: Orthopedic Surgery

## 2020-11-30 ENCOUNTER — Encounter (HOSPITAL_COMMUNITY): Payer: Self-pay

## 2020-11-30 DIAGNOSIS — Z01818 Encounter for other preprocedural examination: Secondary | ICD-10-CM | POA: Diagnosis not present

## 2020-11-30 HISTORY — DX: Anemia, unspecified: D64.9

## 2020-11-30 LAB — CBC
HCT: 39.1 % (ref 36.0–46.0)
Hemoglobin: 12.4 g/dL (ref 12.0–15.0)
MCH: 30.4 pg (ref 26.0–34.0)
MCHC: 31.7 g/dL (ref 30.0–36.0)
MCV: 95.8 fL (ref 80.0–100.0)
Platelets: 265 10*3/uL (ref 150–400)
RBC: 4.08 MIL/uL (ref 3.87–5.11)
RDW: 13.6 % (ref 11.5–15.5)
WBC: 8.1 10*3/uL (ref 4.0–10.5)
nRBC: 0 % (ref 0.0–0.2)

## 2020-11-30 LAB — BASIC METABOLIC PANEL
Anion gap: 8 (ref 5–15)
BUN: 20 mg/dL (ref 8–23)
CO2: 25 mmol/L (ref 22–32)
Calcium: 9.7 mg/dL (ref 8.9–10.3)
Chloride: 109 mmol/L (ref 98–111)
Creatinine, Ser: 0.83 mg/dL (ref 0.44–1.00)
GFR, Estimated: >60 mL/min (ref >60)
Glucose, Bld: 102 mg/dL — ABNORMAL HIGH (ref 70–99)
Potassium: 4.7 mmol/L (ref 3.5–5.1)
Sodium: 142 mmol/L (ref 135–145)

## 2020-11-30 LAB — SURGICAL PCR SCREEN
MRSA, PCR: NEGATIVE
Staphylococcus aureus: NEGATIVE

## 2020-11-30 NOTE — Progress Notes (Signed)
COVID Vaccine Completed: Yes Date COVID Vaccine completed: 05/16/20 COVID vaccine manufacturer: Pfizer     PCP - Dr. Josetta Huddle Cardiologist -   Chest x-ray -  EKG -  Stress Test -  ECHO - 04/30/18 Cardiac Cath -  Pacemaker/ICD device last checked:  Sleep Study -  CPAP -   Fasting Blood Sugar -  Checks Blood Sugar _____ times a day  Blood Thinner Instructions: Aspirin Instructions: Last Dose:  Anesthesia review: Hx: CKD (one kidney left),Heart murmur,tachycardia.  Patient denies shortness of breath, fever, cough and chest pain at PAT appointment   Patient verbalized understanding of instructions that were given to them at the PAT appointment. Patient was also instructed that they will need to review over the PAT instructions again at home before surgery.

## 2020-12-01 DIAGNOSIS — S336XXA Sprain of sacroiliac joint, initial encounter: Secondary | ICD-10-CM | POA: Diagnosis not present

## 2020-12-01 DIAGNOSIS — M5432 Sciatica, left side: Secondary | ICD-10-CM | POA: Diagnosis not present

## 2020-12-01 DIAGNOSIS — M9903 Segmental and somatic dysfunction of lumbar region: Secondary | ICD-10-CM | POA: Diagnosis not present

## 2020-12-01 DIAGNOSIS — M9902 Segmental and somatic dysfunction of thoracic region: Secondary | ICD-10-CM | POA: Diagnosis not present

## 2020-12-06 ENCOUNTER — Other Ambulatory Visit (HOSPITAL_COMMUNITY)
Admission: RE | Admit: 2020-12-06 | Discharge: 2020-12-06 | Disposition: A | Discharge status: Home / Self Care | Payer: PPO | Source: Ambulatory Visit | Attending: Orthopedic Surgery | Admitting: Orthopedic Surgery

## 2020-12-06 DIAGNOSIS — Z20822 Contact with and (suspected) exposure to covid-19: Secondary | ICD-10-CM | POA: Insufficient documentation

## 2020-12-06 DIAGNOSIS — Z01812 Encounter for preprocedural laboratory examination: Secondary | ICD-10-CM | POA: Diagnosis not present

## 2020-12-06 LAB — SARS CORONAVIRUS 2 (TAT 6-24 HRS): SARS Coronavirus 2: NEGATIVE

## 2020-12-08 MED ORDER — BUPIVACAINE LIPOSOME 1.3 % IJ SUSP
20.0000 mL | Freq: Once | INTRAMUSCULAR | Status: DC
  Filled 2020-12-08: qty 20

## 2020-12-09 ENCOUNTER — Encounter (HOSPITAL_COMMUNITY): Payer: Self-pay | Admitting: Orthopedic Surgery

## 2020-12-09 ENCOUNTER — Ambulatory Visit (HOSPITAL_COMMUNITY): Payer: PPO | Admitting: Physician Assistant

## 2020-12-09 ENCOUNTER — Encounter (HOSPITAL_COMMUNITY)
Admission: RE | Disposition: A | Discharge status: Home / Self Care | Payer: Self-pay | Source: Home / Self Care | Attending: Orthopedic Surgery

## 2020-12-09 ENCOUNTER — Ambulatory Visit (HOSPITAL_COMMUNITY): Payer: PPO | Admitting: Anesthesiology

## 2020-12-09 ENCOUNTER — Other Ambulatory Visit: Payer: Self-pay

## 2020-12-09 ENCOUNTER — Observation Stay (HOSPITAL_COMMUNITY)
Admission: RE | Admit: 2020-12-09 | Discharge: 2020-12-11 | Disposition: A | Discharge status: Home / Self Care | Payer: PPO | Attending: Orthopedic Surgery | Admitting: Orthopedic Surgery

## 2020-12-09 DIAGNOSIS — Z96651 Presence of right artificial knee joint: Secondary | ICD-10-CM

## 2020-12-09 DIAGNOSIS — J45909 Unspecified asthma, uncomplicated: Secondary | ICD-10-CM | POA: Insufficient documentation

## 2020-12-09 DIAGNOSIS — M1711 Unilateral primary osteoarthritis, right knee: Secondary | ICD-10-CM | POA: Diagnosis not present

## 2020-12-09 DIAGNOSIS — F419 Anxiety disorder, unspecified: Secondary | ICD-10-CM | POA: Diagnosis not present

## 2020-12-09 DIAGNOSIS — K219 Gastro-esophageal reflux disease without esophagitis: Secondary | ICD-10-CM | POA: Diagnosis not present

## 2020-12-09 DIAGNOSIS — G8918 Other acute postprocedural pain: Secondary | ICD-10-CM | POA: Diagnosis not present

## 2020-12-09 HISTORY — PX: TOTAL KNEE ARTHROPLASTY: SHX125

## 2020-12-09 SURGERY — ARTHROPLASTY, KNEE, TOTAL
Anesthesia: Spinal | Site: Knee | Laterality: Right

## 2020-12-09 MED ORDER — ONDANSETRON HCL 4 MG PO TABS: 4.0000 mg | ORAL_TABLET | Freq: Three times a day (TID) | ORAL | 1 refills | Status: DC | PRN

## 2020-12-09 MED ORDER — MIDAZOLAM HCL 2 MG/2ML IJ SOLN
INTRAMUSCULAR | Status: AC
  Administered 2020-12-09: 1 mg via INTRAVENOUS
  Filled 2020-12-09: qty 2

## 2020-12-09 MED ORDER — SODIUM CHLORIDE (PF) 0.9 % IJ SOLN
INTRAMUSCULAR | Status: AC
  Filled 2020-12-09: qty 30

## 2020-12-09 MED ORDER — ORAL CARE MOUTH RINSE: 15.0000 mL | Freq: Once | OROMUCOSAL | Status: AC

## 2020-12-09 MED ORDER — MENTHOL 3 MG MT LOZG: 1.0000 | LOZENGE | OROMUCOSAL | Status: DC | PRN

## 2020-12-09 MED ORDER — METOCLOPRAMIDE HCL 5 MG PO TABS: 5.0000 mg | ORAL_TABLET | Freq: Three times a day (TID) | ORAL | Status: DC | PRN

## 2020-12-09 MED ORDER — ONDANSETRON HCL 4 MG/2ML IJ SOLN: 4.0000 mg | Freq: Four times a day (QID) | INTRAMUSCULAR | Status: DC | PRN

## 2020-12-09 MED ORDER — CEFAZOLIN SODIUM-DEXTROSE 2-4 GM/100ML-% IV SOLN
2.0000 g | INTRAVENOUS | Status: AC
  Administered 2020-12-09: 2 g via INTRAVENOUS
  Filled 2020-12-09: qty 100

## 2020-12-09 MED ORDER — OXYCODONE-ACETAMINOPHEN 5-325 MG PO TABS: 1.0000 | ORAL_TABLET | ORAL | 0 refills | Status: DC | PRN

## 2020-12-09 MED ORDER — PHENOL 1.4 % MT LIQD: 1.0000 | OROMUCOSAL | Status: DC | PRN

## 2020-12-09 MED ORDER — FAMOTIDINE 20 MG PO TABS: 20.0000 mg | ORAL_TABLET | Freq: Every day | ORAL | Status: DC | PRN

## 2020-12-09 MED ORDER — PROPOFOL 500 MG/50ML IV EMUL
INTRAVENOUS | Status: DC | PRN
  Administered 2020-12-09: 120 ug/kg/min via INTRAVENOUS

## 2020-12-09 MED ORDER — HYDROMORPHONE HCL 1 MG/ML IJ SOLN
INTRAMUSCULAR | Status: AC
  Filled 2020-12-09: qty 1

## 2020-12-09 MED ORDER — BISACODYL 10 MG RE SUPP: 10.0000 mg | Freq: Every day | RECTAL | Status: DC | PRN

## 2020-12-09 MED ORDER — POLYETHYLENE GLYCOL 3350 17 G PO PACK: 17.0000 g | PACK | Freq: Every day | ORAL | Status: DC | PRN

## 2020-12-09 MED ORDER — OXYCODONE HCL 5 MG PO TABS
5.0000 mg | ORAL_TABLET | ORAL | Status: DC | PRN
  Administered 2020-12-09 – 2020-12-11 (×10): 10 mg via ORAL
  Filled 2020-12-09 (×10): qty 2

## 2020-12-09 MED ORDER — MIDAZOLAM HCL 2 MG/2ML IJ SOLN: 1.0000 mg | INTRAMUSCULAR | Status: DC

## 2020-12-09 MED ORDER — FENTANYL CITRATE (PF) 100 MCG/2ML IJ SOLN
INTRAMUSCULAR | Status: AC
  Administered 2020-12-09: 50 ug via INTRAVENOUS
  Filled 2020-12-09: qty 2

## 2020-12-09 MED ORDER — FLUTICASONE PROPIONATE 50 MCG/ACT NA SUSP
1.0000 | Freq: Every day | NASAL | Status: DC | PRN
  Filled 2020-12-09: qty 16

## 2020-12-09 MED ORDER — BUPIVACAINE IN DEXTROSE 0.75-8.25 % IT SOLN
INTRATHECAL | Status: DC | PRN
  Administered 2020-12-09: 12 mg via INTRATHECAL

## 2020-12-09 MED ORDER — LACTATED RINGERS IV SOLN: INTRAVENOUS | Status: DC

## 2020-12-09 MED ORDER — APIXABAN 2.5 MG PO TABS: 2.5000 mg | ORAL_TABLET | Freq: Two times a day (BID) | ORAL | 0 refills | Status: DC

## 2020-12-09 MED ORDER — PROMETHAZINE HCL 25 MG/ML IJ SOLN: 6.2500 mg | INTRAMUSCULAR | Status: DC | PRN

## 2020-12-09 MED ORDER — OXYCODONE HCL 5 MG PO TABS: 5.0000 mg | ORAL_TABLET | Freq: Once | ORAL | Status: DC | PRN

## 2020-12-09 MED ORDER — METHOCARBAMOL 500 MG PO TABS: 500.0000 mg | ORAL_TABLET | Freq: Four times a day (QID) | ORAL | 1 refills | Status: DC | PRN

## 2020-12-09 MED ORDER — ONDANSETRON HCL 4 MG PO TABS: 4.0000 mg | ORAL_TABLET | Freq: Four times a day (QID) | ORAL | Status: DC | PRN

## 2020-12-09 MED ORDER — FENTANYL CITRATE (PF) 100 MCG/2ML IJ SOLN
50.0000 ug | INTRAMUSCULAR | Status: DC
Start: 2020-12-09 — End: 2020-12-09

## 2020-12-09 MED ORDER — SODIUM CHLORIDE (PF) 0.9 % IJ SOLN
INTRAMUSCULAR | Status: DC | PRN
  Administered 2020-12-09: 30 mL

## 2020-12-09 MED ORDER — ACETAMINOPHEN 325 MG PO TABS: 325.0000 mg | ORAL_TABLET | Freq: Four times a day (QID) | ORAL | Status: DC | PRN

## 2020-12-09 MED ORDER — DOCUSATE SODIUM 100 MG PO CAPS
100.0000 mg | ORAL_CAPSULE | Freq: Two times a day (BID) | ORAL | Status: DC
  Administered 2020-12-09 – 2020-12-11 (×4): 100 mg via ORAL
  Filled 2020-12-09 (×4): qty 1

## 2020-12-09 MED ORDER — CHLORHEXIDINE GLUCONATE 0.12 % MT SOLN
15.0000 mL | Freq: Once | OROMUCOSAL | Status: AC
  Administered 2020-12-09: 15 mL via OROMUCOSAL

## 2020-12-09 MED ORDER — ROPIVACAINE HCL 7.5 MG/ML IJ SOLN
INTRAMUSCULAR | Status: DC | PRN
  Administered 2020-12-09: 20 mL via PERINEURAL

## 2020-12-09 MED ORDER — ONDANSETRON HCL 4 MG/2ML IJ SOLN
INTRAMUSCULAR | Status: DC | PRN
  Administered 2020-12-09: 4 mg via INTRAVENOUS

## 2020-12-09 MED ORDER — 0.9 % SODIUM CHLORIDE (POUR BTL) OPTIME
TOPICAL | Status: DC | PRN
  Administered 2020-12-09: 1000 mL

## 2020-12-09 MED ORDER — VITAMIN D 25 MCG (1000 UNIT) PO TABS
1000.0000 [IU] | ORAL_TABLET | Freq: Every day | ORAL | Status: DC
  Administered 2020-12-10 – 2020-12-11 (×2): 1000 [IU] via ORAL
  Filled 2020-12-09 (×2): qty 1

## 2020-12-09 MED ORDER — GABAPENTIN 300 MG PO CAPS
600.0000 mg | ORAL_CAPSULE | Freq: Every day | ORAL | Status: DC
  Administered 2020-12-09 – 2020-12-10 (×2): 600 mg via ORAL
  Filled 2020-12-09 (×2): qty 2

## 2020-12-09 MED ORDER — VITAMIN D (CHOLECALCIFEROL) 25 MCG (1000 UT) PO TABS: 1000.0000 [IU] | ORAL_TABLET | Freq: Every day | ORAL | Status: DC

## 2020-12-09 MED ORDER — DEXAMETHASONE SODIUM PHOSPHATE 10 MG/ML IJ SOLN
INTRAMUSCULAR | Status: DC | PRN
  Administered 2020-12-09: 10 mg via INTRAVENOUS

## 2020-12-09 MED ORDER — LIDOCAINE 2% (20 MG/ML) 5 ML SYRINGE
INTRAMUSCULAR | Status: DC | PRN
  Administered 2020-12-09: 40 mg via INTRAVENOUS

## 2020-12-09 MED ORDER — LORATADINE 10 MG PO TABS: 10.0000 mg | ORAL_TABLET | Freq: Every day | ORAL | Status: DC | PRN

## 2020-12-09 MED ORDER — HYDROMORPHONE HCL 1 MG/ML IJ SOLN
0.2500 mg | INTRAMUSCULAR | Status: DC | PRN
  Administered 2020-12-09: 0.5 mg via INTRAVENOUS

## 2020-12-09 MED ORDER — OXYCODONE HCL 5 MG/5ML PO SOLN: 5.0000 mg | Freq: Once | ORAL | Status: DC | PRN

## 2020-12-09 MED ORDER — WATER FOR IRRIGATION, STERILE IR SOLN
Status: DC | PRN
  Administered 2020-12-09: 2000 mL

## 2020-12-09 MED ORDER — TRANEXAMIC ACID-NACL 1000-0.7 MG/100ML-% IV SOLN
1000.0000 mg | INTRAVENOUS | Status: AC
  Administered 2020-12-09: 1000 mg via INTRAVENOUS
  Filled 2020-12-09: qty 100

## 2020-12-09 MED ORDER — SODIUM CHLORIDE 0.9 % IV SOLN: INTRAVENOUS | Status: DC

## 2020-12-09 MED ORDER — MIDAZOLAM HCL 2 MG/2ML IJ SOLN
0.5000 mg | Freq: Once | INTRAMUSCULAR | Status: DC | PRN
Start: 2020-12-09 — End: 2020-12-09

## 2020-12-09 MED ORDER — BUPIVACAINE-EPINEPHRINE (PF) 0.25% -1:200000 IJ SOLN
INTRAMUSCULAR | Status: AC
  Filled 2020-12-09: qty 30

## 2020-12-09 MED ORDER — APIXABAN 2.5 MG PO TABS
2.5000 mg | ORAL_TABLET | Freq: Two times a day (BID) | ORAL | Status: DC
  Administered 2020-12-10 – 2020-12-11 (×3): 2.5 mg via ORAL
  Filled 2020-12-09 (×3): qty 1

## 2020-12-09 MED ORDER — METHOCARBAMOL 500 MG IVPB - SIMPLE MED
INTRAVENOUS | Status: AC
  Administered 2020-12-09: 500 mg via INTRAVENOUS
  Filled 2020-12-09: qty 50

## 2020-12-09 MED ORDER — POVIDONE-IODINE 10 % EX SWAB
2.0000 "application " | Freq: Once | CUTANEOUS | Status: AC
  Administered 2020-12-09: 2 via TOPICAL

## 2020-12-09 MED ORDER — BUPIVACAINE-EPINEPHRINE 0.25% -1:200000 IJ SOLN
INTRAMUSCULAR | Status: DC | PRN
  Administered 2020-12-09: 30 mL

## 2020-12-09 MED ORDER — HYDROMORPHONE HCL 1 MG/ML IJ SOLN
0.5000 mg | INTRAMUSCULAR | Status: DC | PRN
  Administered 2020-12-10: 0.5 mg via INTRAVENOUS
  Filled 2020-12-09: qty 1

## 2020-12-09 MED ORDER — ACETAMINOPHEN 500 MG PO TABS
1000.0000 mg | ORAL_TABLET | Freq: Once | ORAL | Status: AC
  Administered 2020-12-09: 1000 mg via ORAL
  Filled 2020-12-09: qty 2

## 2020-12-09 MED ORDER — METHOCARBAMOL 500 MG IVPB - SIMPLE MED
500.0000 mg | Freq: Four times a day (QID) | INTRAVENOUS | Status: DC | PRN
  Filled 2020-12-09: qty 50

## 2020-12-09 MED ORDER — TRANEXAMIC ACID-NACL 1000-0.7 MG/100ML-% IV SOLN
1000.0000 mg | Freq: Once | INTRAVENOUS | Status: AC
  Administered 2020-12-09: 1000 mg via INTRAVENOUS
  Filled 2020-12-09: qty 100

## 2020-12-09 MED ORDER — METHOCARBAMOL 500 MG PO TABS
500.0000 mg | ORAL_TABLET | Freq: Four times a day (QID) | ORAL | Status: DC | PRN
  Administered 2020-12-10 – 2020-12-11 (×4): 500 mg via ORAL
  Filled 2020-12-09 (×5): qty 1

## 2020-12-09 MED ORDER — METOCLOPRAMIDE HCL 5 MG/ML IJ SOLN: 5.0000 mg | Freq: Three times a day (TID) | INTRAMUSCULAR | Status: DC | PRN

## 2020-12-09 MED ORDER — MEPERIDINE HCL 50 MG/ML IJ SOLN: 6.2500 mg | INTRAMUSCULAR | Status: DC | PRN

## 2020-12-09 MED ORDER — CEFAZOLIN SODIUM-DEXTROSE 2-4 GM/100ML-% IV SOLN
2.0000 g | Freq: Four times a day (QID) | INTRAVENOUS | Status: AC
  Administered 2020-12-09 (×2): 2 g via INTRAVENOUS
  Filled 2020-12-09 (×2): qty 100

## 2020-12-09 MED ORDER — ALBUTEROL SULFATE HFA 108 (90 BASE) MCG/ACT IN AERS: 2.0000 | INHALATION_SPRAY | Freq: Four times a day (QID) | RESPIRATORY_TRACT | Status: DC | PRN

## 2020-12-09 MED ORDER — BUPIVACAINE LIPOSOME 1.3 % IJ SUSP
INTRAMUSCULAR | Status: DC | PRN
  Administered 2020-12-09: 20 mL

## 2020-12-09 MED ORDER — CALCIUM CARBONATE ANTACID 500 MG PO CHEW: 1.0000 | CHEWABLE_TABLET | Freq: Every day | ORAL | Status: DC | PRN

## 2020-12-09 SURGICAL SUPPLY — 52 items
ATTUNE MED DOME PAT 32 KNEE (Knees) ×2 IMPLANT
ATTUNE PSFEM RTSZ5 NARCEM KNEE (Femur) ×2 IMPLANT
ATTUNE PSRP INSR SZ5 8 KNEE (Insert) ×2 IMPLANT
BAG ZIPLOCK 12X15 (MISCELLANEOUS) ×2 IMPLANT
BASE TIBIAL ROT PLAT SZ 3 KNEE (Knees) ×1 IMPLANT
BLADE SAG 18X100X1.27 (BLADE) ×2 IMPLANT
BLADE SAW SGTL 13X75X1.27 (BLADE) ×2 IMPLANT
BNDG ELASTIC 6X10 VLCR STRL LF (GAUZE/BANDAGES/DRESSINGS) ×2 IMPLANT
BNDG GAUZE ELAST 4 BULKY (GAUZE/BANDAGES/DRESSINGS) ×2 IMPLANT
BOWL SMART MIX CTS (DISPOSABLE) ×2 IMPLANT
CEMENT HV SMART SET (Cement) ×4 IMPLANT
COVER SURGICAL LIGHT HANDLE (MISCELLANEOUS) ×2 IMPLANT
COVER WAND RF STERILE (DRAPES) IMPLANT
CUFF TOURN SGL QUICK 34 (TOURNIQUET CUFF) ×2
CUFF TRNQT CYL 34X4.125X (TOURNIQUET CUFF) ×1 IMPLANT
DRAPE SHEET LG 3/4 BI-LAMINATE (DRAPES) ×2 IMPLANT
DRAPE U-SHAPE 47X51 STRL (DRAPES) ×2 IMPLANT
DRSG ADAPTIC 3X8 NADH LF (GAUZE/BANDAGES/DRESSINGS) ×2 IMPLANT
DRSG PAD ABDOMINAL 8X10 ST (GAUZE/BANDAGES/DRESSINGS) ×2 IMPLANT
DURAPREP 26ML APPLICATOR (WOUND CARE) ×2 IMPLANT
ELECT REM PT RETURN 15FT ADLT (MISCELLANEOUS) ×2 IMPLANT
GAUZE SPONGE 4X4 12PLY STRL (GAUZE/BANDAGES/DRESSINGS) ×2 IMPLANT
GLOVE BIOGEL PI ORTHO PRO 7.5 (GLOVE) ×1
GLOVE PI ORTHO PRO STRL 7.5 (GLOVE) ×1 IMPLANT
GLOVE SURG ORTHO LTX SZ7.5 (GLOVE) ×2 IMPLANT
GLOVE SURG ORTHO LTX SZ8.5 (GLOVE) ×4 IMPLANT
GLOVE SURG POLY ORTHO LF SZ8 (GLOVE) ×2 IMPLANT
GOWN STRL REUS W/TWL XL LVL3 (GOWN DISPOSABLE) ×4 IMPLANT
HANDPIECE INTERPULSE COAX TIP (DISPOSABLE) ×2
HOLDER FOLEY CATH W/STRAP (MISCELLANEOUS) ×2 IMPLANT
IMMOBILIZER KNEE 20 (SOFTGOODS)
IMMOBILIZER KNEE 20 THIGH 36 (SOFTGOODS) IMPLANT
KIT TURNOVER KIT A (KITS) ×2 IMPLANT
MANIFOLD NEPTUNE II (INSTRUMENTS) ×2 IMPLANT
NS IRRIG 1000ML POUR BTL (IV SOLUTION) ×2 IMPLANT
PACK TOTAL KNEE CUSTOM (KITS) ×2 IMPLANT
PENCIL SMOKE EVACUATOR (MISCELLANEOUS) IMPLANT
PIN DRILL FIX HALF THREAD (BIT) ×2 IMPLANT
PIN STEINMAN FIXATION KNEE (PIN) ×2 IMPLANT
PROTECTOR NERVE ULNAR (MISCELLANEOUS) ×2 IMPLANT
SET HNDPC FAN SPRY TIP SCT (DISPOSABLE) ×1 IMPLANT
STAPLER VISISTAT 35W (STAPLE) IMPLANT
STRIP CLOSURE SKIN 1/2X4 (GAUZE/BANDAGES/DRESSINGS) ×2 IMPLANT
SUT MNCRL AB 3-0 PS2 18 (SUTURE) ×2 IMPLANT
SUT VIC AB 0 CT1 36 (SUTURE) ×2 IMPLANT
SUT VIC AB 1 CT1 36 (SUTURE) ×6 IMPLANT
SUT VIC AB 2-0 CT1 27 (SUTURE) ×2
SUT VIC AB 2-0 CT1 TAPERPNT 27 (SUTURE) ×1 IMPLANT
TIBIAL BASE ROT PLAT SZ 3 KNEE (Knees) ×2 IMPLANT
TRAY FOLEY MTR SLVR 14FR STAT (SET/KITS/TRAYS/PACK) ×2 IMPLANT
TRAY FOLEY MTR SLVR 16FR STAT (SET/KITS/TRAYS/PACK) IMPLANT
WATER STERILE IRR 1000ML POUR (IV SOLUTION) ×4 IMPLANT

## 2020-12-09 NOTE — Discharge Instructions (Signed)
Ice to the knee constantly.  Keep the incision covered and clean and dry for one week, then ok to get it wet in the shower.  Do exercise as instructed every hour, please to prevent stiffness.    DO NOT prop anything under the knee, it will make your knee stiff.  Prop under the ankle to encourage your knee to go straight.   Use the walker while you are up and around for balance.  Wear your support stockings 24/7 to prevent blood clots and make sure to take your Eliquis daily for 30 days also to prevent blood clots  Follow up with Dr Veverly Fells in two weeks in the office, call [phone] for appt  ___________________________________________  Information on my medicine - ELIQUIS (apixaban)  This medication education was reviewed with me or my healthcare representative as part of my discharge preparation.   Why was Eliquis prescribed for you? Eliquis was prescribed for you to reduce the risk of blood clots forming after orthopedic surgery.    What do You need to know about Eliquis? Take your Eliquis TWICE DAILY - one tablet in the morning and one tablet in the evening with or without food.  It would be best to take the dose about the same time each day.  If you have difficulty swallowing the tablet whole please discuss with your pharmacist how to take the medication safely.  Take Eliquis exactly as prescribed by your doctor and DO NOT stop taking Eliquis without talking to the doctor who prescribed the medication.  Stopping without other medication to take the place of Eliquis may increase your risk of developing a clot.  After discharge, you should have regular check-up appointments with your healthcare provider that is prescribing your Eliquis.  What do you do if you miss a dose? If a dose of ELIQUIS is not taken at the scheduled time, take it as soon as possible on the same day and twice-daily administration should be resumed.  The dose should not be doubled to make up for a  missed dose.  Do not take more than one tablet of ELIQUIS at the same time.  Important Safety Information A possible side effect of Eliquis is bleeding. You should call your healthcare provider right away if you experience any of the following: ? Bleeding from an injury or your nose that does not stop. ? Unusual colored urine (red or dark brown) or unusual colored stools (red or black). ? Unusual bruising for unknown reasons. ? A serious fall or if you hit your head (even if there is no bleeding).  Some medicines may interact with Eliquis and might increase your risk of bleeding or clotting while on Eliquis. To help avoid this, consult your healthcare provider or pharmacist prior to using any new prescription or non-prescription medications, including herbals, vitamins, non-steroidal anti-inflammatory drugs (NSAIDs) and supplements.  This website has more information on Eliquis (apixaban): http://www.eliquis.com/eliquis/home

## 2020-12-09 NOTE — Progress Notes (Signed)
AssistedDr. Carswell Jackson with right, ultrasound guided, adductor canal block. Side rails up, monitors on throughout procedure. See vital signs in flow sheet. Tolerated Procedure well.  

## 2020-12-09 NOTE — Anesthesia Procedure Notes (Signed)
Anesthesia Regional Block: Adductor canal block   Pre-Anesthetic Checklist: ,, timeout performed, Correct Patient, Correct Site, Correct Laterality, Correct Procedure, Correct Position, site marked, Risks and benefits discussed,  Surgical consent,  Pre-op evaluation,  At surgeon's request and post-op pain management  Laterality: Right and Lower  Prep: chloraprep       Needles:  Injection technique: Single-shot  Needle Type: Echogenic Needle     Needle Length: 9cm  Needle Gauge: 21     Additional Needles:   Procedures:,,,, ultrasound used (permanent image in chart),,,,  Narrative:  Start time: 12/09/2020 10:32 AM End time: 12/09/2020 10:38 AM Injection made incrementally with aspirations every 5 mL.  Performed by: Personally  Anesthesiologist: Annye Asa, MD  Additional Notes: Pt identified in Holding room.  Monitors applied. Working IV access confirmed. Sterile prep R thigh.  #21ga ECHOgenic Arrow block needle into adductor canal with US guidance.  20cc 0.75% Ropivacaine injected incrementally after negative test dose.  Patient asymptomatic, VSS, no heme aspirated, tolerated well.  Jenita Seashore, MD

## 2020-12-09 NOTE — Progress Notes (Signed)
Orthopedic Tech Progress Note Patient Details:  [name] 1951/07/22 [id number]  CPM Right Knee CPM Right Knee: On Right Knee Flexion (Degrees): 90 Right Knee Extension (Degrees): 0 Additional Comments: foot roll  Post Interventions Patient Tolerated: Well Instructions Provided: Care of device  [name] 12/09/2020, 3:16 PM

## 2020-12-09 NOTE — Brief Op Note (Signed)
12/09/2020  12:59 PM  PATIENT:  [name]  70 y.o. female  PRE-OPERATIVE DIAGNOSIS:  Right knee endstage osteoarthritis  POST-OPERATIVE DIAGNOSIS:  Right knee endstage osteoarthritis  PROCEDURE:  Procedure(s): TOTAL KNEE ARTHROPLASTY (Right) DePuy Attune  SURGEON:  Surgeon(s) and Role:    Netta Cedars, MD - Primary  PHYSICIAN ASSISTANT:   ASSISTANTS: Ventura Bruns, PA-C   ANESTHESIA:   Regional and Spinal  EBL:  minimal  BLOOD ADMINISTERED:none  DRAINS: none   LOCAL MEDICATIONS USED:  MARCAINE     SPECIMEN:  No Specimen  DISPOSITION OF SPECIMEN:  N/A  COUNTS:  YES  TOURNIQUET:   Total Tourniquet Time Documented: Thigh (Right) - 90 minutes Total: Thigh (Right) - 90 minutes   DICTATION: .Other Dictation: Dictation Number [id number]  PLAN OF CARE: Admit for overnight observation  PATIENT DISPOSITION:  PACU - hemodynamically stable.   Delay start of Pharmacological VTE agent (>24hrs) due to surgical blood loss or risk of bleeding: no

## 2020-12-09 NOTE — Anesthesia Preprocedure Evaluation (Addendum)
Anesthesia Evaluation  Patient identified by MRN, date of birth, ID band Patient awake    Reviewed: Allergy & Precautions, NPO status , Patient's Chart, lab work & pertinent test results  History of Anesthesia Complications (+) PONV  Airway Mallampati: II  TM Distance: >3 FB Neck ROM: Full    Dental  (+) Chipped, Dental Advisory Given   Pulmonary asthma ,  12/06/2020 SARS coronavirus NEG   breath sounds clear to auscultation       Cardiovascular negative cardio ROS   Rhythm:Regular Rate:Normal  '19 ECHO: EF 60-65%. Wall motion was normal; there were no regional wall motion abnormalities.  - Mitral valve: There was trivial regurgitation.  - Tricuspid valve: There was trivial regurgitation.  - Pulmonic valve: There was mild regurgitation   Neuro/Psych  Headaches, Anxiety    GI/Hepatic Neg liver ROS, GERD  Medicated and Controlled,  Endo/Other  negative endocrine ROS  Renal/GU Single kidney     Musculoskeletal  (+) Arthritis , Osteoarthritis,    Abdominal   Peds  Hematology negative hematology ROS (+)   Anesthesia Other Findings   Reproductive/Obstetrics                            Anesthesia Physical Anesthesia Plan  ASA: II  Anesthesia Plan: Spinal   Post-op Pain Management:  Regional for Post-op pain   Induction:   PONV Risk Score and Plan: 3 and Ondansetron, Dexamethasone and Treatment may vary due to age or medical condition  Airway Management Planned: Natural Airway and Simple Face Mask  Additional Equipment: None  Intra-op Plan:   Post-operative Plan:   Informed Consent: I have reviewed the patients History and Physical, chart, labs and discussed the procedure including the risks, benefits and alternatives for the proposed anesthesia with the patient or authorized representative who has indicated his/her understanding and acceptance.     Dental advisory given  Plan  Discussed with: CRNA and Surgeon  Anesthesia Plan Comments: (Plan routine monitors, SAB with adductor canal block for post op analgesia)        Anesthesia Quick Evaluation

## 2020-12-09 NOTE — Care Plan (Signed)
Ortho Bundle Case Management Note  Patient Details  Name: [name] MRN: [id number] Date of Birth: 02/17/1951                  R TKA on 12-09-20 DCP: Home with husband. 1 story home with 2 ste. DME: RW and 3-in-1 ordered through [facility]. PT: EO. PT eval scheduled on 12-13-20.   DME Arranged:  3-N-1,Walker rolling DME Agency:  [employer]  HH Arranged:    [place] Agency:     Additional Comments: Please contact me with any questions of if this plan should need to change.  Marianne Sofia, RN,CCM [facility]  [phone] 12/09/2020, 10:39 AM

## 2020-12-09 NOTE — Anesthesia Procedure Notes (Signed)
Spinal  Patient location during procedure: OR End time: 12/09/2020 10:58 AM Reason for block: surgical anesthesia Staffing Performed: anesthesiologist  Anesthesiologist: Annye Asa, MD Preanesthetic Checklist Completed: patient identified, IV checked, site marked, risks and benefits discussed, surgical consent, monitors and equipment checked, pre-op evaluation and timeout performed Spinal Block Patient position: sitting Prep: DuraPrep and site prepped and draped Patient monitoring: blood pressure, continuous pulse ox, cardiac monitor and heart rate Approach: midline Location: L3-4 Injection technique: single-shot Needle Needle type: Pencan and Introducer  Needle gauge: 24 G Needle length: 9 cm Assessment Events: CSF return Additional Notes Pt identified in Operating room.  Monitors applied. Working IV access confirmed. Sterile prep, drape lumbar spine.  1% lido local L 3,4.  #24ga Pencan into clear CSF L 3,4.  12mg  0.75% Bupivacaine with dextrose injected with asp CSF beginning and end of injection.  Patient asymptomatic, VSS, no heme aspirated, tolerated well.  Jenita Seashore, MD

## 2020-12-09 NOTE — Interval H&P Note (Signed)
History and Physical Interval Note:  12/09/2020 10:47 AM  [name]  has presented today for surgery, with the diagnosis of Right knee endstage osteoarthritis.  The various methods of treatment have been discussed with the patient and family. After consideration of risks, benefits and other options for treatment, the patient has consented to  Procedure(s): TOTAL KNEE ARTHROPLASTY (Right) as a surgical intervention.  The patient's history has been reviewed, patient examined, no change in status, stable for surgery.  I have reviewed the patient's chart and labs.  Questions were answered to the patient's satisfaction.     Augustin Schooling

## 2020-12-09 NOTE — Transfer of Care (Signed)
Immediate Anesthesia Transfer of Care Note  Patient: [name]  Procedure(s) Performed: TOTAL KNEE ARTHROPLASTY (Right Knee)  Patient Location: PACU  Anesthesia Type:MAC combined with regional for post-op pain  Level of Consciousness: awake, alert , oriented and patient cooperative  Airway & Oxygen Therapy: Patient Spontanous Breathing and Patient connected to nasal cannula oxygen  Post-op Assessment: Report given to RN and Post -op Vital signs reviewed and stable  Post vital signs: Reviewed and stable  Last Vitals:  Vitals Value Taken Time  BP    Temp    Pulse 87 12/09/20 1303  Resp 17 12/09/20 1303  SpO2 100 % 12/09/20 1303  Vitals shown include unvalidated device data.  Last Pain:  Vitals:   12/09/20 1026  TempSrc: Oral  PainSc:       Patients Stated Pain Goal: 4 (85/46/27 0350)  Complications: No complications documented.

## 2020-12-09 NOTE — Anesthesia Postprocedure Evaluation (Signed)
Anesthesia Post Note  Patient: [name]  Procedure(s) Performed: TOTAL KNEE ARTHROPLASTY (Right Knee)     Patient location during evaluation: PACU Anesthesia Type: Spinal Level of consciousness: awake and alert, patient cooperative and oriented Pain management: pain level controlled Vital Signs Assessment: post-procedure vital signs reviewed and stable Respiratory status: nonlabored ventilation, spontaneous breathing and respiratory function stable Cardiovascular status: blood pressure returned to baseline and stable Postop Assessment: patient able to bend at knees, spinal receding and no apparent nausea or vomiting Anesthetic complications: no   No complications documented.  Last Vitals:  Vitals:   12/09/20 1430 12/09/20 1530  BP: (!) 149/85 140/86  Pulse: 80 80  Resp: 10 16  Temp:  36.7 C  SpO2: 100% 98%                 Ashtin Melichar,E. [name]

## 2020-12-10 DIAGNOSIS — Z96651 Presence of right artificial knee joint: Secondary | ICD-10-CM | POA: Diagnosis not present

## 2020-12-10 DIAGNOSIS — Z96619 Presence of unspecified artificial shoulder joint: Secondary | ICD-10-CM | POA: Diagnosis not present

## 2020-12-10 DIAGNOSIS — M1711 Unilateral primary osteoarthritis, right knee: Secondary | ICD-10-CM | POA: Diagnosis not present

## 2020-12-10 MED ORDER — SODIUM CHLORIDE 0.9 % IV SOLN: INTRAVENOUS | Status: DC

## 2020-12-10 NOTE — Progress Notes (Signed)
Orthopedic Tech Progress Note Patient Details:  [name] 12-Mar-1951 [id number]  CPM Left Knee CPM Left Knee: On CPM Right Knee CPM Right Knee: Off Right Knee Flexion (Degrees): 90 Right Knee Extension (Degrees): 0 Additional Comments: foot roll  Post Interventions Patient Tolerated: Well Instructions Provided: Care of device  [name] 12/10/2020, 4:47 PM

## 2020-12-10 NOTE — Evaluation (Signed)
Physical Therapy Evaluation Patient Details Name: [name] MRN: [id number] DOB: 11-22-1950 Today's Date: 12/10/2020   History of Present Illness  Pt s/p R TKR and with hx of nephrectomy, CKD, L reverse TSR  Clinical Impression  Pt s/p R TKR and presents with decreased R LE strength/ROM, post op pain and orthostatic hypotension (BP 72/53) with attempts to mobilize limiting functional mobility.  Pt should progress to dc home with family assist and reports first OP PT appt for 12/13/20.    Follow Up Recommendations Follow surgeon's recommendation for DC plan and follow-up therapies    Equipment Recommendations  Rolling walker with 5" wheels;3in1 (PT)    Recommendations for Other Services       Precautions / Restrictions Precautions Precautions: Knee;Fall Restrictions Weight Bearing Restrictions: No RLE Weight Bearing: Weight bearing as tolerated      Mobility  Bed Mobility Overal bed mobility: Needs Assistance Bed Mobility: Supine to Sit     Supine to sit: Min assist     General bed mobility comments: cues for sequence and use of L LE to self assist    Transfers Overall transfer level: Needs assistance Equipment used: Rolling walker (2 wheeled) Transfers: Sit to/from Stand Sit to Stand: Min assist         General transfer comment: cues for LE management and use of UEs to self assist  Ambulation/Gait Ambulation/Gait assistance: Min assist;Mod assist Gait Distance (Feet): 6 Feet Assistive device: Rolling walker (2 wheeled) Gait Pattern/deviations: Step-to pattern;Decreased step length - right;Decreased step length - left;Shuffle;Trunk flexed Gait velocity: decr   General Gait Details: cues for sequence, posture and position from RW; distance ltd by orthostatic hypotension 72/53  Stairs            Wheelchair Mobility    Modified Rankin (Stroke Patients Only)       Balance Overall balance assessment: Mild deficits observed, not formally  tested;Needs assistance Sitting-balance support: No upper extremity supported;Feet supported Sitting balance-Leahy Scale: Fair     Standing balance support: Bilateral upper extremity supported Standing balance-Leahy Scale: Poor                               Pertinent Vitals/Pain Pain Assessment: 0-10 Pain Score: 6  Pain Location: R knee Pain Descriptors / Indicators: Aching;Sore Pain Intervention(s): Limited activity within patient's tolerance;Monitored during session;Premedicated before session;Ice applied    Home Living Family/patient expects to be discharged to:: Private residence Living Arrangements: Spouse/significant other Available Help at Discharge: Family Type of Home: House Home Access: Stairs to enter Entrance Stairs-Rails: Right Entrance Stairs-Number of Steps: 1+2+1 Home Layout: Able to live on main level with bedroom/bathroom Home Equipment: None      Prior Function Level of Independence: Independent               Hand Dominance   Dominant Hand: Right    Extremity/Trunk Assessment   Upper Extremity Assessment Upper Extremity Assessment: Overall WFL for tasks assessed    Lower Extremity Assessment Lower Extremity Assessment: RLE deficits/detail RLE Deficits / Details: 2+/5 quads with AAROM at knee -5 -45    Cervical / Trunk Assessment Cervical / Trunk Assessment: Normal  Communication   Communication: No difficulties  Cognition Arousal/Alertness: Awake/alert Behavior During Therapy: WFL for tasks assessed/performed Overall Cognitive Status: Within Functional Limits for tasks assessed  General Comments      Exercises Total Joint Exercises Ankle Circles/Pumps: AROM;Both;15 reps;Supine Quad Sets: AROM;Both;10 reps;Supine Heel Slides: AAROM;Right;15 reps;Supine Straight Leg Raises: AAROM;Right;10 reps;Supine   Assessment/Plan    PT Assessment Patient needs continued PT  services  PT Problem List Decreased strength;Decreased range of motion;Decreased activity tolerance;Decreased balance;Decreased mobility;Decreased knowledge of use of DME;Pain       PT Treatment Interventions DME instruction;Gait training;Stair training;Functional mobility training;Therapeutic activities;Therapeutic exercise;Patient/family education    PT Goals (Current goals can be found in the Care Plan section)  Acute Rehab PT Goals Patient Stated Goal: Regain IND PT Goal Formulation: With patient Time For Goal Achievement: 12/10/20 Potential to Achieve Goals: Good    Frequency 7X/week   Barriers to discharge        Co-evaluation               AM-PAC PT "6 Clicks" Mobility  Outcome Measure Help needed turning from your back to your side while in a flat bed without using bedrails?: A Little Help needed moving from lying on your back to sitting on the side of a flat bed without using bedrails?: A Little Help needed moving to and from a bed to a chair (including a wheelchair)?: A Little Help needed standing up from a chair using your arms (e.g., wheelchair or bedside chair)?: A Little Help needed to walk in hospital room?: A Lot Help needed climbing 3-5 steps with a railing? : A Lot 6 Click Score: 16    End of Session Equipment Utilized During Treatment: Gait belt;Right knee immobilizer Activity Tolerance: Patient limited by fatigue Patient left: in chair;with call bell/phone within reach;with nursing/sitter in room Nurse Communication: Mobility status PT Visit Diagnosis: Unsteadiness on feet (R26.81);Difficulty in walking, not elsewhere classified (R26.2);Pain Pain - Right/Left: Right Pain - part of body: Knee    Time: 1108-1140 PT Time Calculation (min) (ACUTE ONLY): 32 min   Charges:   PT Evaluation $PT Eval Low Complexity: 1 Low PT Treatments $Therapeutic Exercise: 8-22 mins        Debe Coder PT Acute Rehabilitation Services Pager  [phone] Office [phone]   [name] 12/10/2020, 1:26 PM

## 2020-12-10 NOTE — Progress Notes (Addendum)
Subjective: 1 Day Post-Op Procedure(s) (LRB): TOTAL KNEE ARTHROPLASTY (Right) Patient reports pain as mild.   Seen in rounds for Dr. Veverly Fells Doing well, no complaints overnight   Objective: Vital signs in last 24 hours: Temp:  [97.6 F (36.4 C)-98.7 F (37.1 C)] 98 F (36.7 C) (05/07 0910) Pulse Rate:  [70-88] 78 (05/07 0910) Resp:  [10-18] 16 (05/07 0910) BP: (114-178)/(60-86) 126/74 (05/07 0910) SpO2:  [97 %-100 %] 100 % (05/07 0910)  Intake/Output from previous day: 05/06 0701 - 05/07 0700 In: 1850 [P.O.:150; I.V.:850; IV Piggyback:100] Out: 1700 [Urine:1650; Blood:50] Intake/Output this shift: Total I/O In: 240 [P.O.:240] Out: -   No results for input(s): HGB in the last 72 hours. No results for input(s): WBC, RBC, HCT, PLT in the last 72 hours. No results for input(s): NA, K, CL, CO2, BUN, CREATININE, GLUCOSE, CALCIUM in the last 72 hours. No results for input(s): LABPT, INR in the last 72 hours.  Neurologically intact Neurovascular intact Sensation intact distally Intact pulses distally Dorsiflexion/Plantar flexion intact Incision: dressing C/D/I Compartment soft   Assessment/Plan: 1 Day Post-Op Procedure(s) (LRB): TOTAL KNEE ARTHROPLASTY (Right) Advance diet Up with therapy Patient doing well, will get up with PT today Aquacel dressings placed If does well with PT okay to be discharged home today Meds have been sent to pharmacy    Patient's anticipated LOS is less than 2 midnights, meeting these requirements: - Younger than 46 - Lives within 1 hour of care - Has a competent adult at home to recover with post-op recover - NO history of  - Chronic pain requiring opiods  - Diabetes  - Coronary Artery Disease  - Heart failure  - Heart attack  - Stroke  - DVT/VTE  - Cardiac arrhythmia  - Respiratory Failure/COPD  - Renal failure  - Anemia  - Advanced Liver disease       Nettie Elm [facility] [phone] 12/10/2020, 11:05  AM

## 2020-12-10 NOTE — Progress Notes (Signed)
Pt unable to urinate.  States she feels no urge to go.   Bladder scan shows 43ml. Elizabeth Sauer PA notified.  Ordered to continue IV fluids, encourage PO intake.

## 2020-12-10 NOTE — Op Note (Signed)
[name], Calderon MEDICAL RECORD NO: [id number] ACCOUNT NO: [id number] DATE OF BIRTH: 1951/02/01 FACILITY: [facility] LOCATION: [id number] PHYSICIAN: Crystal Heater. Veverly Fells, MD  Operative Report   DATE OF PROCEDURE: 12/09/2020  PREOPERATIVE DIAGNOSIS:  Right knee end-stage arthritis.  POSTOPERATIVE DIAGNOSIS:  Right knee end-stage arthritis.  PROCEDURE PERFORMED:  Right total knee replacement using DePuy Attune prosthesis.  ATTENDING SURGEON:  Crystal Heater. Veverly Fells, MD  ASSISTANT:  [name], [place], who was scrubbed during the entire procedure, and necessary for satisfactory completion of the surgery.  ANESTHESIA:  Spinal anesthesia was used plus adductor canal block.  ESTIMATED BLOOD LOSS:  Minimal.  FLUID REPLACEMENT:  1500 mL crystalloid.  INSTRUMENT COUNTS:  Correct.  COMPLICATIONS:  No complications.  ANTIBIOTICS:  Perioperative antibiotics were given.  TOURNIQUET TIME:  1 hour and 20 minutes at 300 mmHg.  INDICATIONS:  The patient is a 70 year old female who presents with a history of end-stage arthritis of the right knee.  The patient has bone-on-bone findings on standing x-rays and has had a failure of conservative management over the course of years  including injections and modification of activity as well as medications.  The patient presents for total knee arthroplasty to eliminate pain and restore function.  Informed consent obtained.  DESCRIPTION OF PROCEDURE:  After an adequate level of anesthesia was achieved, the patient positioned in the supine position, right leg correctly identified and a nonsterile tourniquet placed on proximal thigh.  Right leg sterilely prepped and draped in  the usual manner.  Timeout called, verifying correct patient, correct site.  We entered the patient's knee.  After elevation of the limb and exsanguination with an Esmarch bandage and elevation of the tourniquet to 300 mmHg, placed the knee in flexion  and performed a longitudinal midline  incision with a 10 blade scalpel.  Dissection down through subcutaneous tissues using the 10 blade.  We then identified the parapatellar tissues and used a fresh 10 blade scalpel to perform a medial parapatellar  arthrotomy.  We then divided the lateral patellofemoral ligaments everting the patella and exposing the distal femur.  There was bone-on-bone wear on the lateral compartment.  We entered the distal femur with a step cut drill and a cut initially 9 and  then went to 11 mm resection off the distal femur to get a flush cut on the lateral condyle, which was hypoplastic and likely partly responsible for her valgus knee.  We were set on 3 degrees right because the patient had valgus preoperative alignment.   Once we had that distal cut performed, we performed our sizing and sized it to a size 5 anterior down and performed our anterior, posterior and chamfer cuts.  Appropriate resections were done anteriorly and posteriorly.  They were flushed with the  anterior femoral cortex.  We then performed our chamfer cuts.  Next, we removed ACL, PCL meniscal tissue and subluxed the tibia anteriorly.  We went ahead and resected the tibia at 90 degrees perpendicular to the long axis of the tibia with minimal  posterior slope, resecting 2 mm off the affected lateral side with the external alignment guide and the oscillating saw. We then used the lamina spreader, removed excess posterior femoral osteophytes off the posterior femoral condyles and released some  capsule laterally as she was definitely tight laterally.  Once we had that done, then we completed our femoral preparation with the box cut guide and we then completed our tibial preparation, sizing for a size 3.  The 4  would have fit, but just based on  her bony anatomy,we would have the implant significantly internally rotated, thus externally rotating relatively the tibial tubercle and potentially causing issues with patellar tracking, so we went with a 3,  which still gave [place] good side-to-side  coverage, but allowed [place] to rotate that tibial component externally to allow for better patellar tracking.  Once we had our trial components in place on the tibia and the femur, we used a 6 mm spacer and placed that on the tibial tray and then reduced  the knee.  We were pleased with the soft tissue balancing and stability.  We then went to the patella and resurfaced it going 25 mm and down to a 16 mm.  We then drilled our lug holes and placed a 32 patellar button on.  We did have to do a lateral  release since there was some slight lateral subluxation, but with a lateral release, patellar tracking was excellent with no-touch technique with no subluxation.  At this point, we removed all trial components, irrigated the knee thoroughly dried the  bone, and then vacuum mixed high viscosity cement on the back table.  We cemented the tibia, femur, and the patella, all in one stage, placed the knee in extension with a 6 mm poly trial in place and held the knee in extension compressing it until the  cement was hardened.  We then removed excess cement with a curved osteotome.  Checked the knee in flexion.  The patient had slight bit of laxity.  We felt like we could get the 8 in and then removed the trial 6 mm poly. Checked for excess cement in the  back of the knee and in the gutters and then irrigated thoroughly and then placed the size 5, 8 mm poly onto the tibial tray and reduced the knee, had a nice little pop as that medial condyle reducing, had excellent patellar tracking and stability in  flexion and extension.  We were able to get to full extension.  We placed the knee in slight flexion, irrigated thoroughly and then repaired the parapatellar arthrotomy with #1 Vicryl suture interrupted, followed by 2-0 Vicryl for subcutaneous closure  and 4-0 Monocryl for skin.  Steri-Strips were applied followed by sterile dressing.  The patient tolerated surgery well.   [name]:  12/09/2020 1:09:09 pm T: 12/10/2020 6:03:00 am  JOB: [id number]/ [id number]

## 2020-12-11 DIAGNOSIS — M1711 Unilateral primary osteoarthritis, right knee: Secondary | ICD-10-CM | POA: Diagnosis not present

## 2020-12-11 NOTE — Discharge Summary (Signed)
Physician Discharge Summary  Patient ID: TAMETRIA AHO MRN: [id number] DOB/AGE: 02/10/1951 70 y.o.  Admit date: 12/09/2020 Discharge date: 12/11/2020  Admission Diagnoses: R knee OA; hx of anemia, anxiety, bilateral shoulder OA, asthma, CKD, GERD, headache, heart murmur, and tachycardia  Discharge Diagnoses:  Active Problems:   Status post total knee replacement, right Same as above  Discharged Condition: stable  Hospital Course: Patient presented to [facility] for elective R total knee arthroplasty by Dr. Veverly Fells.  The patient tolerated with procedure well without complication.  She was then admitted to the hospital.  She worked well with therapy.  She tolerated her stay well without complication.  She is to be D/C'd home on 12/06/20.  Consults: PT/OT  Significant Diagnostic Studies: N/A  Treatments: IV hydration, antibiotics: Ancef, analgesia: acetaminophen, Dilaudid and oxycodone, anticoagulation: eliquis and surgery: as stated above  Discharge Exam: Blood pressure 140/70, pulse 100, temperature 98 F (36.7 C), temperature source Oral, resp. rate 16, height 5\' 4"  (1.626 m), weight 70.8 kg, SpO2 99 %. General: WDWN patient in NAD. Psych:  Appropriate mood and affect. Neuro:  A&O x 3, Moving all extremities, sensation intact to light touch HEENT:  EOMs intact Chest:  Even non-labored respirations Skin:  Dressing C/D/I, no rashes or lesions Extremities: warm/dry, mild edema to R knee, no erythema or echymosis.  No lymphadenopathy. Pulses: Popliteus 2+ MSK:  ROM: lacks 5 degrees TKE, MMT: able to perform quad set,    Disposition: Discharge disposition: 01-Home or Self Care       Discharge Instructions    Call MD / Call 911   Complete by: As directed    If you experience chest pain or shortness of breath, CALL 911 and be transported to the hospital emergency room.  If you develope a fever above 101 F, pus (white drainage) or increased drainage or redness at the wound, or calf pain,  call your surgeon's office.   Constipation Prevention   Complete by: As directed    Drink plenty of fluids.  Prune juice may be helpful.  You may use a stool softener, such as Colace (over the counter) 100 mg twice a day.  Use MiraLax (over the counter) for constipation as needed.   Diet - low sodium heart healthy   Complete by: As directed    Increase activity slowly as tolerated   Complete by: As directed    Post-operative opioid taper instructions:   Complete by: As directed    POST-OPERATIVE OPIOID TAPER INSTRUCTIONS: It is important to wean off of your opioid medication as soon as possible. If you do not need pain medication after your surgery it is ok to stop day one. Opioids include: Codeine, Hydrocodone(Norco, Vicodin), Oxycodone(Percocet, oxycontin) and hydromorphone amongst others.  Long term and even short term use of opiods can cause: Increased pain response Dependence Constipation Depression Respiratory depression And more.  Withdrawal symptoms can include Flu like symptoms Nausea, vomiting And more Techniques to manage these symptoms Hydrate well Eat regular healthy meals Stay active Use relaxation techniques(deep breathing, meditating, yoga) Do Not substitute Alcohol to help with tapering If you have been on opioids for less than two weeks and do not have pain than it is ok to stop all together.  Plan to wean off of opioids This plan should start within one week post op of your joint replacement. Maintain the same interval or time between taking each dose and first decrease the dose.  Cut the total daily intake of opioids by  one tablet each day Next start to increase the time between doses. The last dose that should be eliminated is the evening dose.      Weight bearing as tolerated   Complete by: As directed    Laterality: right   Extremity: Lower     Allergies as of 12/11/2020   No Known Allergies     Medication List    TAKE these medications    albuterol 108 (90 Base) MCG/ACT inhaler Commonly known as: VENTOLIN HFA Inhale 2 puffs into the lungs every 6 (six) hours as needed for wheezing or shortness of breath.   apixaban 2.5 MG Tabs tablet Commonly known as: Eliquis Take 1 tablet (2.5 mg total) by mouth 2 (two) times daily.   calcium carbonate 500 MG chewable tablet Commonly known as: TUMS - dosed in mg elemental calcium Chew 1 tablet by mouth daily as needed for heartburn or indigestion.   famotidine 20 MG tablet Commonly known as: PEPCID Take 20 mg by mouth daily as needed for heartburn or indigestion.   fluticasone 50 MCG/ACT nasal spray Commonly known as: FLONASE Place 1 spray into both nostrils daily as needed for allergies.   gabapentin 300 MG capsule Commonly known as: NEURONTIN Take 600 mg by mouth at bedtime.   loratadine 10 MG tablet Commonly known as: CLARITIN Take 10 mg by mouth daily as needed for allergies.   methocarbamol 500 MG tablet Commonly known as: Robaxin Take 1 tablet (500 mg total) by mouth every 6 (six) hours as needed for muscle spasms.   MULTIVITAMIN PO Take 1 tablet by mouth daily.   ondansetron 4 MG tablet Commonly known as: Zofran Take 1 tablet (4 mg total) by mouth every 8 (eight) hours as needed for nausea, vomiting or refractory nausea / vomiting.   oxyCODONE-acetaminophen 5-325 MG tablet Commonly known as: Percocet Take 1-2 tablets by mouth every 4 (four) hours as needed for moderate pain or severe pain.   Vitamin D (Cholecalciferol) 25 MCG (1000 UT) Tabs Take 1,000 Units by mouth daily.            Durable Medical Equipment  (From admission, onward)         Start     Ordered   12/10/20 1356  For home use only DME 3 n 1  Once        12/10/20 1356   12/10/20 1354  For home use only DME Walker rolling  Once       Question Answer Comment  Walker: With 5 Inch Wheels   Patient needs a walker to treat with the following condition Osteoarthritis of right knee       12/10/20 1356   12/10/20 1334  For home use only DME 3 n 1  Once        12/10/20 1333   12/10/20 1333  For home use only DME Walker rolling  Once       Question Answer Comment  Walker: With [place] Wheels   Patient needs a walker to treat with the following condition S/P knee replacement      12/10/20 1333           Discharge Care Instructions  (From admission, onward)         Start     Ordered   12/11/20 0000  Weight bearing as tolerated       Question Answer Comment  Laterality right   Extremity Lower      12/11/20 0922  Follow-up Information    Netta Cedars, MD. Go on 12/20/2020.   Specialty: Orthopedic Surgery Why: You are scheduled for first post op on Tuesday May 17th at 2:00pm. Contact information: [address] [place] [address] [phone]        Rosilyn Mings.. Go on 12/13/2020.   Why: You are scheduled for physical therapy eval on Tuesday May 10th at 2:15pm. Contact information: [place] [address] [phone]        Netta Cedars, MD. Call in 2 weeks.   Specialty: Orthopedic Surgery Why: [phone] Contact information: [address] [place] [address] [phone]        [facility] [employer] Follow up.   Why: This is the company  [employer] for you walker and bedside commode.  Contact information: [place] [address] [phone]               Signed: Mechele Claude PA-C [facility] Office:  [phone]

## 2020-12-11 NOTE — Progress Notes (Signed)
Physical Therapy Treatment Patient Details Name: [name] MRN: [id number] DOB: 10-Apr-1951 Today's Date: 12/11/2020    History of Present Illness Pt s/p R TKR and with hx of nephrectomy, CKD, L reverse TSR    PT Comments    Pt very cooperative, with improved pain control, and with no c/o dizziness.  Pt performed therex program and up to ambulate increased distance in hall.  Spouse will be available for family ed around noon.   Follow Up Recommendations  Follow surgeon's recommendation for DC plan and follow-up therapies     Equipment Recommendations  Rolling walker with 5" wheels;3in1 (PT)    Recommendations for Other Services       Precautions / Restrictions Precautions Precautions: Knee;Fall Restrictions Weight Bearing Restrictions: No RLE Weight Bearing: Weight bearing as tolerated    Mobility  Bed Mobility Overal bed mobility: Needs Assistance Bed Mobility: Supine to Sit     Supine to sit: Min assist     General bed mobility comments: cues for sequence and use of L LE to self assist    Transfers Overall transfer level: Needs assistance Equipment used: Rolling walker (2 wheeled) Transfers: Sit to/from [facility] Sit to Stand: Min guard Stand pivot transfers: Min guard       General transfer comment: cues for LE management and use of UEs to self assist: Stand pvt bed to [facility]  Ambulation/Gait Ambulation/Gait assistance: Min assist   Assistive device: Rolling walker (2 wheeled) Gait Pattern/deviations: Step-to pattern;Decreased step length - right;Decreased step length - left;Shuffle;Trunk flexed Gait velocity: decr   General Gait Details: cues for sequence, posture and position from RW; no c/o dizziness   Stairs             Wheelchair Mobility    Modified Rankin (Stroke Patients Only)       Balance Overall balance assessment: Mild deficits observed, not formally tested;Needs assistance Sitting-balance support: No  upper extremity supported;Feet supported Sitting balance-Leahy Scale: Good     Standing balance support: Bilateral upper extremity supported Standing balance-Leahy Scale: Poor                              Cognition Arousal/Alertness: Awake/alert Behavior During Therapy: WFL for tasks assessed/performed Overall Cognitive Status: Within Functional Limits for tasks assessed                                        Exercises Total Joint Exercises Ankle Circles/Pumps: AROM;Both;15 reps;Supine Quad Sets: AROM;Both;10 reps;Supine Heel Slides: AAROM;Right;15 reps;Supine Straight Leg Raises: AAROM;Right;10 reps;Supine    General Comments        Pertinent Vitals/Pain Pain Assessment: 0-10 Pain Score: 5  Pain Location: R knee Pain Descriptors / Indicators: Aching;Sore Pain Intervention(s): Limited activity within patient's tolerance;Monitored during session;Premedicated before session;Ice applied    Home Living                      Prior Function            PT Goals (current goals can now be found in the care plan section) Acute Rehab PT Goals Patient Stated Goal: Regain IND PT Goal Formulation: With patient Time For Goal Achievement: 12/10/20 Potential to Achieve Goals: Good    Frequency    7X/week      PT Plan Current plan remains appropriate  Co-evaluation              AM-PAC PT "6 Clicks" Mobility   Outcome Measure  Help needed turning from your back to your side while in a flat bed without using bedrails?: A Little Help needed moving from lying on your back to sitting on the side of a flat bed without using bedrails?: A Little Help needed moving to and from a bed to a chair (including a wheelchair)?: A Little Help needed standing up from a chair using your arms (e.g., wheelchair or bedside chair)?: A Little Help needed to walk in hospital room?: A Little Help needed climbing 3-5 steps with a railing? : A Lot 6  Click Score: 17    End of Session Equipment Utilized During Treatment: Gait belt;Right knee immobilizer Activity Tolerance: Patient tolerated treatment well Patient left: in chair;with call bell/phone within reach;with nursing/sitter in room Nurse Communication: Mobility status PT Visit Diagnosis: Unsteadiness on feet (R26.81);Difficulty in walking, not elsewhere classified (R26.2);Pain Pain - Right/Left: Right Pain - part of body: Knee     Time: 0813-0852 PT Time Calculation (min) (ACUTE ONLY): 39 min  Charges:  $Gait Training: 8-22 mins $Therapeutic Exercise: 8-22 mins $Therapeutic Activity: 8-22 mins                     Debe Coder PT Acute Rehabilitation Services Pager [phone] Office [phone]    Katiria Calame 12/11/2020, 1:45 PM

## 2020-12-11 NOTE — Plan of Care (Signed)
  Problem: Education: Goal: Knowledge of General Education information will improve Description: Including pain rating scale, medication(s)/side effects and non-pharmacologic comfort measures Outcome: Progressing   Problem: Health Behavior/Discharge Planning: Goal: Ability to manage health-related needs will improve Outcome: Progressing   Problem: Activity: Goal: Risk for activity intolerance will decrease Outcome: Progressing   Problem: Pain Managment: Goal: General experience of comfort will improve Outcome: Progressing   Problem: Pain Management: Goal: Pain level will decrease with appropriate interventions Outcome: Progressing   

## 2020-12-11 NOTE — Progress Notes (Signed)
Physical Therapy Treatment Patient Details Name: [name] MRN: [id number] DOB: 1951/02/16 Today's Date: 12/11/2020    History of Present Illness Pt s/p R TKR and with hx of nephrectomy, CKD, L reverse TSR    PT Comments    Pt continues motivated and eager for dc home.  Pt up to [facility] for toileting, ambulated inhall, negotiated stairs and performed HEP.  Written instruction provided for HEP and stairs and spouse present for full session.   Follow Up Recommendations  Follow surgeon's recommendation for DC plan and follow-up therapies     Equipment Recommendations  Rolling walker with 5" wheels;3in1 (PT)    Recommendations for Other Services       Precautions / Restrictions Precautions Precautions: Knee;Fall Restrictions Weight Bearing Restrictions: No RLE Weight Bearing: Weight bearing as tolerated    Mobility  Bed Mobility Overal bed mobility: Needs Assistance Bed Mobility: Sit to Supine     Supine to sit: Min assist Sit to supine: Min assist   General bed mobility comments: cues for sequence and use of L LE to self assist    Transfers Overall transfer level: Needs assistance Equipment used: Rolling walker (2 wheeled) Transfers: Sit to/from [facility] Sit to Stand: Min guard;Supervision Stand pivot transfers: Min guard;Supervision       General transfer comment: cues for LE management and use of UEs to self assist: Stand pvt recliner to [facility]  Ambulation/Gait Ambulation/Gait assistance: Min guard;Supervision Gait Distance (Feet): 85 Feet Assistive device: Rolling walker (2 wheeled) Gait Pattern/deviations: Step-to pattern;Decreased step length - right;Decreased step length - left;Shuffle;Trunk flexed Gait velocity: decr   General Gait Details: cues for sequence, posture and position from RW; no c/o dizziness   Stairs Stairs: Yes Stairs assistance: Min assist Stair Management: No rails;One rail Right;Step to  pattern;Backwards;Forwards;With walker;With crutches Number of Stairs: 3 General stair comments: single step bkwd with RW; 2 step fwd with crutch and rail; cues for sequence and foot/RW/crutch placement   Wheelchair Mobility    Modified Rankin (Stroke Patients Only)       Balance Overall balance assessment: Mild deficits observed, not formally tested;Needs assistance Sitting-balance support: No upper extremity supported;Feet supported Sitting balance-Leahy Scale: Good     Standing balance support: No upper extremity supported Standing balance-Leahy Scale: Fair Standing balance comment: pt able to don mask with both hands in standing                            Cognition Arousal/Alertness: Awake/alert Behavior During Therapy: WFL for tasks assessed/performed Overall Cognitive Status: Within Functional Limits for tasks assessed                                        Exercises Total Joint Exercises Ankle Circles/Pumps: AROM;Both;15 reps;Supine Quad Sets: AROM;Both;10 reps;Supine Heel Slides: AAROM;Right;Supine;10 reps Straight Leg Raises: AAROM;Right;10 reps;Supine    General Comments        Pertinent Vitals/Pain Pain Assessment: 0-10 Pain Score: 4  Pain Location: R knee Pain Descriptors / Indicators: Aching;Sore Pain Intervention(s): Limited activity within patient's tolerance;Monitored during session;Premedicated before session;Ice applied    Home Living                      Prior Function            PT Goals (current goals can now be found  in the care plan section) Acute Rehab PT Goals Patient Stated Goal: Regain IND PT Goal Formulation: With patient Time For Goal Achievement: 12/10/20 Potential to Achieve Goals: Good Progress towards PT goals: Progressing toward goals    Frequency    7X/week      PT Plan Current plan remains appropriate    Co-evaluation              AM-PAC PT "6 Clicks" Mobility    Outcome Measure  Help needed turning from your back to your side while in a flat bed without using bedrails?: A Little Help needed moving from lying on your back to sitting on the side of a flat bed without using bedrails?: A Little Help needed moving to and from a bed to a chair (including a wheelchair)?: A Little Help needed standing up from a chair using your arms (e.g., wheelchair or bedside chair)?: A Little Help needed to walk in hospital room?: A Little Help needed climbing 3-5 steps with a railing? : A Little 6 Click Score: 18    End of Session Equipment Utilized During Treatment: Gait belt;Right knee immobilizer Activity Tolerance: Patient tolerated treatment well Patient left: with call bell/phone within reach;in bed;with family/visitor present Nurse Communication: Mobility status PT Visit Diagnosis: Unsteadiness on feet (R26.81);Difficulty in walking, not elsewhere classified (R26.2);Pain Pain - Right/Left: Right Pain - part of body: Knee     Time: 1226-1310 PT Time Calculation (min) (ACUTE ONLY): 44 min  Charges:  $Gait Training: 8-22 mins $Therapeutic Exercise: 8-22 mins $Therapeutic Activity: 8-22 mins                     Debe Coder PT Acute Rehabilitation Services Pager [phone] Office [phone]    Taje Tondreau 12/11/2020, 1:50 PM

## 2020-12-11 NOTE — Progress Notes (Signed)
Subjective: 2 Days Post-Op Procedure(s) (LRB): TOTAL KNEE ARTHROPLASTY (Right)  Patient reports pain as mild to moderate.  Resting comfortably in chair.  Denies fever, chills, N/V, CP, SOB.  Tolerating POs well.  Admits to flatus.  States that she is ready to go home.  Objective:   VITALS:  Temp:  [98 F (36.7 C)-99.5 F (37.5 C)] 98 F (36.7 C) (05/08 0532) Pulse Rate:  [80-100] 100 (05/08 0532) Resp:  [16] 16 (05/08 0532) BP: (72-165)/(46-81) 140/70 (05/08 0532) SpO2:  [95 %-99 %] 99 % (05/08 0532)  General: WDWN patient in NAD. Psych:  Appropriate mood and affect. Neuro:  A&O x 3, Moving all extremities, sensation intact to light touch HEENT:  EOMs intact Chest:  Even non-labored respirations Skin:  Dressing C/D/I, no rashes or lesions Extremities: warm/dry, mild edema to R knee edema, no erythema or echymosis.  No lymphadenopathy. Pulses: Popliteus 2+ MSK:  ROM: lacks 5 degrees TKE, MMT: able to perform quad set    LABS No results for input(s): HGB, WBC, PLT in the last 72 hours. No results for input(s): NA, K, CL, CO2, BUN, CREATININE, GLUCOSE in the last 72 hours. No results for input(s): LABPT, INR in the last 72 hours.   Assessment/Plan: 2 Days Post-Op Procedure(s) (LRB): TOTAL KNEE ARTHROPLASTY (Right)  Patient seen in rounds for Dr. Bluford Main R LE Up with therapy D/C home today after working with therapy D/C order placed. D/C scripts sent to patient's pharmacy Plan for outpatient post-op visit with Dr. Veverly Fells.  Mechele Claude PA-C [facility] Office:  [phone]

## 2020-12-11 NOTE — Progress Notes (Signed)
The patient is alert and oriented and has been seen by her physician. The orders for discharge were written. IV has have been removed. Went over discharge instructions with patient and family. She is about to be discharged via wheelchair with all of her belongings.

## 2020-12-13 ENCOUNTER — Encounter (HOSPITAL_COMMUNITY): Payer: Self-pay | Admitting: Orthopedic Surgery

## 2020-12-13 DIAGNOSIS — M25562 Pain in left knee: Secondary | ICD-10-CM | POA: Diagnosis not present

## 2020-12-13 DIAGNOSIS — M25662 Stiffness of left knee, not elsewhere classified: Secondary | ICD-10-CM | POA: Diagnosis not present

## 2020-12-15 DIAGNOSIS — M25662 Stiffness of left knee, not elsewhere classified: Secondary | ICD-10-CM | POA: Diagnosis not present

## 2020-12-15 DIAGNOSIS — M25562 Pain in left knee: Secondary | ICD-10-CM | POA: Diagnosis not present

## 2020-12-20 DIAGNOSIS — Z4789 Encounter for other orthopedic aftercare: Secondary | ICD-10-CM | POA: Diagnosis not present

## 2020-12-21 DIAGNOSIS — M25662 Stiffness of left knee, not elsewhere classified: Secondary | ICD-10-CM | POA: Diagnosis not present

## 2020-12-21 DIAGNOSIS — M25562 Pain in left knee: Secondary | ICD-10-CM | POA: Diagnosis not present

## 2020-12-23 DIAGNOSIS — M25562 Pain in left knee: Secondary | ICD-10-CM | POA: Diagnosis not present

## 2020-12-23 DIAGNOSIS — M25662 Stiffness of left knee, not elsewhere classified: Secondary | ICD-10-CM | POA: Diagnosis not present

## 2020-12-27 DIAGNOSIS — M25562 Pain in left knee: Secondary | ICD-10-CM | POA: Diagnosis not present

## 2020-12-27 DIAGNOSIS — M25662 Stiffness of left knee, not elsewhere classified: Secondary | ICD-10-CM | POA: Diagnosis not present

## 2020-12-29 DIAGNOSIS — M25662 Stiffness of left knee, not elsewhere classified: Secondary | ICD-10-CM | POA: Diagnosis not present

## 2020-12-29 DIAGNOSIS — M25562 Pain in left knee: Secondary | ICD-10-CM | POA: Diagnosis not present

## 2021-01-04 DIAGNOSIS — M25662 Stiffness of left knee, not elsewhere classified: Secondary | ICD-10-CM | POA: Diagnosis not present

## 2021-01-04 DIAGNOSIS — M25562 Pain in left knee: Secondary | ICD-10-CM | POA: Diagnosis not present

## 2021-01-06 DIAGNOSIS — M25662 Stiffness of left knee, not elsewhere classified: Secondary | ICD-10-CM | POA: Diagnosis not present

## 2021-01-06 DIAGNOSIS — M25562 Pain in left knee: Secondary | ICD-10-CM | POA: Diagnosis not present

## 2021-01-10 DIAGNOSIS — M25562 Pain in left knee: Secondary | ICD-10-CM | POA: Diagnosis not present

## 2021-01-10 DIAGNOSIS — M25662 Stiffness of left knee, not elsewhere classified: Secondary | ICD-10-CM | POA: Diagnosis not present

## 2021-01-12 DIAGNOSIS — M25562 Pain in left knee: Secondary | ICD-10-CM | POA: Diagnosis not present

## 2021-01-12 DIAGNOSIS — M25662 Stiffness of left knee, not elsewhere classified: Secondary | ICD-10-CM | POA: Diagnosis not present

## 2021-01-18 DIAGNOSIS — M25662 Stiffness of left knee, not elsewhere classified: Secondary | ICD-10-CM | POA: Diagnosis not present

## 2021-01-18 DIAGNOSIS — M25562 Pain in left knee: Secondary | ICD-10-CM | POA: Diagnosis not present

## 2021-01-20 DIAGNOSIS — M25562 Pain in left knee: Secondary | ICD-10-CM | POA: Diagnosis not present

## 2021-01-20 DIAGNOSIS — M25662 Stiffness of left knee, not elsewhere classified: Secondary | ICD-10-CM | POA: Diagnosis not present

## 2021-01-23 DIAGNOSIS — M25562 Pain in left knee: Secondary | ICD-10-CM | POA: Diagnosis not present

## 2021-01-23 DIAGNOSIS — M25662 Stiffness of left knee, not elsewhere classified: Secondary | ICD-10-CM | POA: Diagnosis not present

## 2021-01-26 DIAGNOSIS — M25662 Stiffness of left knee, not elsewhere classified: Secondary | ICD-10-CM | POA: Diagnosis not present

## 2021-01-26 DIAGNOSIS — M25562 Pain in left knee: Secondary | ICD-10-CM | POA: Diagnosis not present

## 2021-01-30 DIAGNOSIS — M25662 Stiffness of left knee, not elsewhere classified: Secondary | ICD-10-CM | POA: Diagnosis not present

## 2021-01-30 DIAGNOSIS — M25562 Pain in left knee: Secondary | ICD-10-CM | POA: Diagnosis not present

## 2021-02-01 DIAGNOSIS — M25562 Pain in left knee: Secondary | ICD-10-CM | POA: Diagnosis not present

## 2021-02-01 DIAGNOSIS — M25662 Stiffness of left knee, not elsewhere classified: Secondary | ICD-10-CM | POA: Diagnosis not present

## 2021-02-14 DIAGNOSIS — M25562 Pain in left knee: Secondary | ICD-10-CM | POA: Diagnosis not present

## 2021-02-14 DIAGNOSIS — Z4789 Encounter for other orthopedic aftercare: Secondary | ICD-10-CM | POA: Diagnosis not present

## 2021-02-22 DIAGNOSIS — M9903 Segmental and somatic dysfunction of lumbar region: Secondary | ICD-10-CM | POA: Diagnosis not present

## 2021-02-22 DIAGNOSIS — S336XXA Sprain of sacroiliac joint, initial encounter: Secondary | ICD-10-CM | POA: Diagnosis not present

## 2021-02-22 DIAGNOSIS — M9902 Segmental and somatic dysfunction of thoracic region: Secondary | ICD-10-CM | POA: Diagnosis not present

## 2021-02-22 DIAGNOSIS — M5432 Sciatica, left side: Secondary | ICD-10-CM | POA: Diagnosis not present

## 2021-02-24 NOTE — Progress Notes (Signed)
Sent message, via epic in basket, requesting orders in epic from surgeon.  

## 2021-02-27 DIAGNOSIS — M5432 Sciatica, left side: Secondary | ICD-10-CM | POA: Diagnosis not present

## 2021-02-27 DIAGNOSIS — M9902 Segmental and somatic dysfunction of thoracic region: Secondary | ICD-10-CM | POA: Diagnosis not present

## 2021-02-27 DIAGNOSIS — M9903 Segmental and somatic dysfunction of lumbar region: Secondary | ICD-10-CM | POA: Diagnosis not present

## 2021-02-27 DIAGNOSIS — S336XXA Sprain of sacroiliac joint, initial encounter: Secondary | ICD-10-CM | POA: Diagnosis not present

## 2021-03-01 DIAGNOSIS — M9903 Segmental and somatic dysfunction of lumbar region: Secondary | ICD-10-CM | POA: Diagnosis not present

## 2021-03-01 DIAGNOSIS — M5432 Sciatica, left side: Secondary | ICD-10-CM | POA: Diagnosis not present

## 2021-03-01 DIAGNOSIS — S336XXA Sprain of sacroiliac joint, initial encounter: Secondary | ICD-10-CM | POA: Diagnosis not present

## 2021-03-01 DIAGNOSIS — M9902 Segmental and somatic dysfunction of thoracic region: Secondary | ICD-10-CM | POA: Diagnosis not present

## 2021-03-01 NOTE — H&P (Signed)
Patient's anticipated LOS is less than 2 midnights, meeting these requirements: - Younger than 68 - Lives within 1 hour of care - Has a competent adult at home to recover with post-op recover - NO history of  - Chronic pain requiring opiods  - Diabetes  - Coronary Artery Disease  - Heart failure  - Heart attack  - Stroke  - DVT/VTE  - Cardiac arrhythmia  - Respiratory Failure/COPD  - Renal failure  - Anemia  - Advanced Liver disease     [name] is an 70 y.o. female.    Chief Complaint: left knee pain  HPI: Pt is a 70 y.o. female complaining of left knee pain for multiple years. Pain had continually increased since the beginning. X-rays in the clinic show end-stage arthritic changes of the left knee. Pt has tried various conservative treatments which have failed to alleviate their symptoms, including injections and therapy. Various options are discussed with the patient. Risks, benefits and expectations were discussed with the patient. Patient understand the risks, benefits and expectations and wishes to proceed with surgery.   PCP:  Josetta Huddle, MD  D/C Plans: Home  PMH: Past Medical History:  Diagnosis Date   Anemia    Anxiety    long time ago- 1980, has one panic attack     Arthritis    degenerative, shoulders, both    Asthma    seasonal - randomly used inhaler , "never consistently"     Chronic kidney disease    one kidney- Left /w blockage, removed, now with one kidney    Complication of anesthesia    GERD (gastroesophageal reflux disease)    Headache    no meds, just rest & dark , quiet spot    Heart murmur    PONV (postoperative nausea and vomiting)    Tachycardia    back in 1970's, rec'd care for increased heartrate here in [place], was on a betablocker     PSH: Past Surgical History:  Procedure Laterality Date   ARTHRODESIS METATARSALPHALANGEAL JOINT (MTPJ) Right 02/26/2019   Procedure: Right silver bunionectomy, hallux metatarsal phalangeal  joint arthrodesis;  Surgeon: Wylene Simmer, MD;  Location: [facility];  Service: Orthopedics;  Laterality: Right;   BREAST SURGERY Right 1995   biopsy-   CHOLECYSTECTOMY     ERCP  10/2009   normal - abnormal IOC   HAMMERTOE RECONSTRUCTION WITH WEIL OSTEOTOMY Right 02/26/2019   Procedure: Second and Third Weil osteotomy; Second hammertoe correction;  Surgeon: Wylene Simmer, MD;  Location: [facility];  Service: Orthopedics;  Laterality: Right;   NEPHRECTOMY     R hand- wrist     surgery- for cysts & also tendon repair    REVERSE SHOULDER ARTHROPLASTY Left 09/03/2014   Procedure: LEFT SHOULDER REVERSE ARTHROPLASTY;  Surgeon: Augustin Schooling, MD;  Location: [facility];  Service: Orthopedics;  Laterality: Left;   TENDON REPAIR     TONSILLECTOMY     TOTAL KNEE ARTHROPLASTY Right 12/09/2020   Procedure: TOTAL KNEE ARTHROPLASTY;  Surgeon: Netta Cedars, MD;  Location: [facility];  Service: Orthopedics;  Laterality: Right;   UPPER GASTROINTESTINAL ENDOSCOPY      Social History:  reports that she has never smoked. She has never used smokeless tobacco. She reports current alcohol use of about 1.0 standard drink of alcohol per week. She reports that she does not use drugs.  Allergies:  No Known Allergies  Medications: No current facility-administered medications for this encounter.  Current Outpatient Medications  Medication Sig Dispense Refill   albuterol (VENTOLIN HFA) 108 (90 Base) MCG/ACT inhaler Inhale 2 puffs into the lungs every 6 (six) hours as needed for wheezing or shortness of breath.     calcium carbonate (TUMS - DOSED IN MG ELEMENTAL CALCIUM) 500 MG chewable tablet Chew 1 tablet by mouth daily as needed for heartburn or indigestion.     famotidine (PEPCID) 20 MG tablet Take 20 mg by mouth daily as needed for heartburn or indigestion.     gabapentin (NEURONTIN) 300 MG capsule Take 600 mg by mouth at bedtime.     methocarbamol (ROBAXIN) 500 MG tablet Take 1 tablet (500  mg total) by mouth every 6 (six) hours as needed for muscle spasms. 60 tablet 1   Multiple Vitamins-Minerals (MULTIVITAMIN PO) Take 1 tablet by mouth daily.     ondansetron (ZOFRAN) 4 MG tablet Take 1 tablet (4 mg total) by mouth every 8 (eight) hours as needed for nausea, vomiting or refractory nausea / vomiting. 30 tablet 1   traMADol (ULTRAM) 50 MG tablet Take 50 mg by mouth at bedtime.     Vitamin D, Cholecalciferol, 1000 UNITS TABS Take 1,000 Units by mouth daily.     apixaban (ELIQUIS) 2.5 MG TABS tablet Take 1 tablet (2.5 mg total) by mouth 2 (two) times daily. 60 tablet 0   oxyCODONE-acetaminophen (PERCOCET) 5-325 MG tablet Take 1-2 tablets by mouth every 4 (four) hours as needed for moderate pain or severe pain. (Patient not taking: No sig reported) 40 tablet 0    No results found for this or any previous visit (from the past 48 hour(s)). No results found.  ROS: Pain with rom of the left lower extremity  Physical Exam: Alert and oriented 70 y.o. female in no acute distress Cranial nerves 2-12 intact Cervical spine: full rom with no tenderness, nv intact distally Chest: active breath sounds bilaterally, no wheeze rhonchi or rales Heart: regular rate and rhythm, no murmur Abd: non tender non distended with active bowel sounds Hip is stable with rom  Left knee with medial and lateral joint line tenderness Nv intact distally No rashes or edema  Assessment/Plan Assessment: left knee end stage osteoarthritis   Plan:  Patient will undergo a left total knee  by Dr. Veverly Fells at [facility] Risks benefits and expectations were discussed with the patient. Patient understand risks, benefits and expectations and wishes to proceed. Preoperative templating of the joint replacement has been completed, documented, and submitted to the Operating Room personnel in order to optimize intra-operative equipment management.   Merla Riches PA-C, MPAS [facility] is now [employer] [address] [place], [place], [place] [address] Phone: [phone] [email or web address] Facebook  [employer]

## 2021-03-02 ENCOUNTER — Other Ambulatory Visit (HOSPITAL_COMMUNITY): Payer: Self-pay | Admitting: *Deleted

## 2021-03-03 ENCOUNTER — Other Ambulatory Visit (HOSPITAL_COMMUNITY): Payer: Self-pay | Admitting: *Deleted

## 2021-03-03 NOTE — Progress Notes (Signed)
DUE TO COVID-19 ONLY ONE VISITOR IS ALLOWED TO COME WITH YOU AND STAY IN THE WAITING ROOM ONLY DURING PRE OP AND PROCEDURE DAY OF SURGERY. THE 1 VISITOR  MAY VISIT WITH YOU AFTER SURGERY IN YOUR PRIVATE ROOM DURING VISITING HOURS ONLY!  YOU NEED TO HAVE A COVID 19 TEST ON__ 03/14/2021 _____ '@_______'$ , THIS TEST MUST BE DONE BEFORE SURGERY,See map.               [name]  03/03/2021   Your procedure is scheduled on:  03/17/2021   Report to [facility] Main  Entrance   Report to admitting at       [phone]     Call this number if you have problems the morning of surgery [phone]    REMEMBER: NO  SOLID FOOD CANDY OR GUM AFTER MIDNIGHT. CLEAR LIQUIDS UNTIL    0430am       . NOTHING BY MOUTH EXCEPT CLEAR LIQUIDS UNTIL     0430am  . PLEASE FINISH ENSURE DRINK PER SURGEON ORDER  WHICH NEEDS TO BE COMPLETED AT  0430am     .      CLEAR LIQUID DIET   Foods Allowed                                                                    Coffee and tea, regular and decaf                            Fruit ices (not with fruit pulp)                                      Iced Popsicles                                    Carbonated beverages, regular and diet                                    Cranberry, grape and apple juices Sports drinks like Gatorade Lightly seasoned clear broth or consume(fat free) Sugar, honey syrup ___________________________________________________________________      BRUSH YOUR TEETH MORNING OF SURGERY AND RINSE YOUR MOUTH OUT, NO CHEWING GUM CANDY OR MINTS.     Take these medicines the morning of surgery with A SIP OF WATER:  Inhalers as usual and bring   DO NOT TAKE ANY DIABETIC MEDICATIONS DAY OF YOUR SURGERY                               You may not have any metal on your body including hair pins and              piercings  Do not wear jewelry, make-up, lotions, powders or perfumes, deodorant             Do not wear nail polish on your fingernails.  Do  not shave  48 hours prior  to surgery.              Men may shave face and neck.   Do not bring valuables to the hospital. [place].  Contacts, dentures or bridgework may not be worn into surgery.  Leave suitcase in the car. After surgery it may be brought to your room.     Patients discharged the day of surgery will not be allowed to drive home. IF YOU ARE HAVING SURGERY AND GOING HOME THE SAME DAY, YOU MUST HAVE AN ADULT TO DRIVE YOU HOME AND BE WITH YOU FOR 24 HOURS. YOU MAY GO HOME BY TAXI OR UBER OR ORTHERWISE, BUT AN ADULT MUST ACCOMPANY YOU HOME AND STAY WITH YOU FOR 24 HOURS.  Name and phone number of your driver:  Special Instructions: N/A              Please read over the following fact sheets you were given: _____________________________________________________________________  [facility] - Preparing for Surgery Before surgery, you can play an important role.  Because skin is not sterile, your skin needs to be as free of germs as possible.  You can reduce the number of germs on your skin by washing with CHG (chlorahexidine gluconate) soap before surgery.  CHG is an antiseptic cleaner which kills germs and bonds with the skin to continue killing germs even after washing. Please DO NOT use if you have an allergy to CHG or antibacterial soaps.  If your skin becomes reddened/irritated stop using the CHG and inform your nurse when you arrive at Short Stay. Do not shave (including legs and underarms) for at least 48 hours prior to the first CHG shower.  You may shave your face/neck. Please follow these instructions carefully:  1.  Shower with CHG Soap the night before surgery and the  morning of Surgery.  2.  If you choose to wash your hair, wash your hair first as usual with your  normal  shampoo.  3.  After you shampoo, rinse your hair and body thoroughly to remove the  shampoo.                           4.  Use CHG as you would any  other liquid soap.  You can apply chg directly  to the skin and wash                       Gently with a scrungie or clean washcloth.  5.  Apply the CHG Soap to your body ONLY FROM THE NECK DOWN.   Do not use on face/ open                           Wound or open sores. Avoid contact with eyes, ears mouth and genitals (private parts).                       Wash face,  Genitals (private parts) with your normal soap.             6.  Wash thoroughly, paying special attention to the area where your surgery  will be performed.  7.  Thoroughly rinse your body with warm water from the neck down.  8.  DO NOT shower/wash with your normal soap  after using and rinsing off  the CHG Soap.                9.  Pat yourself dry with a clean towel.            10.  Wear clean pajamas.            11.  Place clean sheets on your bed the night of your first shower and do not  sleep with pets. Day of Surgery : Do not apply any lotions/deodorants the morning of surgery.  Please wear clean clothes to the hospital/surgery center.  FAILURE TO FOLLOW THESE INSTRUCTIONS MAY RESULT IN THE CANCELLATION OF YOUR SURGERY PATIENT SIGNATURE_________________________________  NURSE SIGNATURE__________________________________  ________________________________________________________________________

## 2021-03-06 ENCOUNTER — Encounter (HOSPITAL_COMMUNITY): Payer: Self-pay

## 2021-03-06 ENCOUNTER — Other Ambulatory Visit: Payer: Self-pay

## 2021-03-06 ENCOUNTER — Encounter (HOSPITAL_COMMUNITY)
Admission: RE | Admit: 2021-03-06 | Discharge: 2021-03-06 | Disposition: A | Discharge status: Home / Self Care | Payer: PPO | Source: Ambulatory Visit | Attending: Orthopedic Surgery | Admitting: Orthopedic Surgery

## 2021-03-06 DIAGNOSIS — Z01812 Encounter for preprocedural laboratory examination: Secondary | ICD-10-CM | POA: Diagnosis not present

## 2021-03-06 HISTORY — DX: Pneumonia, unspecified organism: J18.9

## 2021-03-06 LAB — CBC
HCT: 38.4 % (ref 36.0–46.0)
Hemoglobin: 12 g/dL (ref 12.0–15.0)
MCH: 28.3 pg (ref 26.0–34.0)
MCHC: 31.3 g/dL (ref 30.0–36.0)
MCV: 90.6 fL (ref 80.0–100.0)
Platelets: 289 10*3/uL (ref 150–400)
RBC: 4.24 MIL/uL (ref 3.87–5.11)
RDW: 14.7 % (ref 11.5–15.5)
WBC: 7.9 10*3/uL (ref 4.0–10.5)
nRBC: 0 % (ref 0.0–0.2)

## 2021-03-06 LAB — BASIC METABOLIC PANEL
Anion gap: 8 (ref 5–15)
BUN: 14 mg/dL (ref 8–23)
CO2: 26 mmol/L (ref 22–32)
Calcium: 9.8 mg/dL (ref 8.9–10.3)
Chloride: 107 mmol/L (ref 98–111)
Creatinine, Ser: 0.8 mg/dL (ref 0.44–1.00)
GFR, Estimated: >60 mL/min (ref >60)
Glucose, Bld: 100 mg/dL — ABNORMAL HIGH (ref 70–99)
Potassium: 4.2 mmol/L (ref 3.5–5.1)
Sodium: 141 mmol/L (ref 135–145)

## 2021-03-06 LAB — SURGICAL PCR SCREEN
MRSA, PCR: NEGATIVE
Staphylococcus aureus: NEGATIVE

## 2021-03-06 NOTE — Progress Notes (Addendum)
       Anesthesia Review:  PCP: DR Marcellus Scott  requested LOV note.  Theuy are to fax. 09/13/20 last visit on chart  Cardiologist : none  Chest x-ray : EKG : 11/30/20  Echo : 2019  Stress test: Cardiac Cath :  Activity level: can do a flight of stairs without difficulty  Sleep Study/ CPAP : none  Fasting Blood Sugar :      / Checks Blood Sugar -- times a day:   Blood Thinner/ Instructions /Last Dose: ASA / Instructions/ Last Dose :   Pt only has one kidney .

## 2021-03-14 ENCOUNTER — Other Ambulatory Visit: Payer: Self-pay | Admitting: Orthopedic Surgery

## 2021-03-15 LAB — SARS CORONAVIRUS 2 (TAT 6-24 HRS): SARS Coronavirus 2: NEGATIVE

## 2021-03-16 MED ORDER — BUPIVACAINE LIPOSOME 1.3 % IJ SUSP
20.0000 mL | Freq: Once | INTRAMUSCULAR | Status: DC
  Filled 2021-03-16: qty 20

## 2021-03-16 NOTE — Anesthesia Preprocedure Evaluation (Addendum)
Anesthesia Evaluation  Patient identified by MRN, date of birth, ID band Patient awake    Reviewed: Allergy & Precautions, NPO status , Patient's Chart, lab work & pertinent test results  History of Anesthesia Complications (+) PONV, MALIGNANT HYPERTHERMIA and history of anesthetic complications  Airway Mallampati: II  TM Distance: >3 FB Neck ROM: Full    Dental  (+) Teeth Intact, Dental Advisory Given   Pulmonary asthma ,    Pulmonary exam normal breath sounds clear to auscultation       Cardiovascular negative cardio ROS Normal cardiovascular exam Rhythm:Regular Rate:Normal     Neuro/Psych negative neurological ROS  negative psych ROS   GI/Hepatic Neg liver ROS, GERD  Medicated,  Endo/Other  negative endocrine ROS  Renal/GU Renal InsufficiencyRenal disease     Musculoskeletal  (+) Arthritis , Left knee end stage osteoarthritis   Abdominal   Peds  Hematology Plt 289k   Anesthesia Other Findings   Reproductive/Obstetrics                            Anesthesia Physical Anesthesia Plan  ASA: 2  Anesthesia Plan: Spinal   Post-op Pain Management:  Regional for Post-op pain   Induction: Intravenous  PONV Risk Score and Plan: 3 and Midazolam, Propofol infusion, Dexamethasone and Ondansetron  Airway Management Planned: Nasal Cannula and Natural Airway  Additional Equipment:   Intra-op Plan:   Post-operative Plan:   Informed Consent: I have reviewed the patients History and Physical, chart, labs and discussed the procedure including the risks, benefits and alternatives for the proposed anesthesia with the patient or authorized representative who has indicated his/her understanding and acceptance.     Dental advisory given  Plan Discussed with: CRNA  Anesthesia Plan Comments:        Anesthesia Quick Evaluation

## 2021-03-17 ENCOUNTER — Encounter (HOSPITAL_COMMUNITY)
Admission: RE | Disposition: A | Discharge status: Home / Self Care | Payer: Self-pay | Source: Home / Self Care | Attending: Orthopedic Surgery

## 2021-03-17 ENCOUNTER — Encounter (HOSPITAL_COMMUNITY): Payer: Self-pay | Admitting: Orthopedic Surgery

## 2021-03-17 ENCOUNTER — Ambulatory Visit (HOSPITAL_COMMUNITY): Payer: PPO | Admitting: Anesthesiology

## 2021-03-17 ENCOUNTER — Inpatient Hospital Stay (HOSPITAL_COMMUNITY)
Admission: RE | Admit: 2021-03-17 | Discharge: 2021-03-20 | DRG: 470 | Disposition: A | Discharge status: Home / Self Care | Payer: PPO | Attending: Orthopedic Surgery | Admitting: Orthopedic Surgery

## 2021-03-17 ENCOUNTER — Ambulatory Visit (HOSPITAL_COMMUNITY): Payer: PPO | Admitting: Physician Assistant

## 2021-03-17 ENCOUNTER — Other Ambulatory Visit: Payer: Self-pay

## 2021-03-17 DIAGNOSIS — K219 Gastro-esophageal reflux disease without esophagitis: Secondary | ICD-10-CM | POA: Diagnosis present

## 2021-03-17 DIAGNOSIS — Z96651 Presence of right artificial knee joint: Secondary | ICD-10-CM | POA: Diagnosis present

## 2021-03-17 DIAGNOSIS — G8918 Other acute postprocedural pain: Secondary | ICD-10-CM | POA: Diagnosis not present

## 2021-03-17 DIAGNOSIS — Z96612 Presence of left artificial shoulder joint: Secondary | ICD-10-CM | POA: Diagnosis present

## 2021-03-17 DIAGNOSIS — J45909 Unspecified asthma, uncomplicated: Secondary | ICD-10-CM | POA: Diagnosis present

## 2021-03-17 DIAGNOSIS — M1712 Unilateral primary osteoarthritis, left knee: Secondary | ICD-10-CM | POA: Diagnosis present

## 2021-03-17 DIAGNOSIS — Z96652 Presence of left artificial knee joint: Secondary | ICD-10-CM

## 2021-03-17 DIAGNOSIS — Z79899 Other long term (current) drug therapy: Secondary | ICD-10-CM

## 2021-03-17 DIAGNOSIS — Z7901 Long term (current) use of anticoagulants: Secondary | ICD-10-CM

## 2021-03-17 DIAGNOSIS — Z905 Acquired absence of kidney: Secondary | ICD-10-CM | POA: Diagnosis not present

## 2021-03-17 DIAGNOSIS — J189 Pneumonia, unspecified organism: Secondary | ICD-10-CM | POA: Diagnosis not present

## 2021-03-17 HISTORY — PX: TOTAL KNEE ARTHROPLASTY: SHX125

## 2021-03-17 SURGERY — ARTHROPLASTY, KNEE, TOTAL
Anesthesia: Spinal | Site: Knee | Laterality: Left

## 2021-03-17 MED ORDER — BUPIVACAINE-EPINEPHRINE (PF) 0.25% -1:200000 IJ SOLN
INTRAMUSCULAR | Status: AC
  Filled 2021-03-17: qty 30

## 2021-03-17 MED ORDER — SODIUM CHLORIDE 0.9 % IR SOLN
Status: DC | PRN
  Administered 2021-03-17: 1000 mL

## 2021-03-17 MED ORDER — DEXAMETHASONE SODIUM PHOSPHATE 10 MG/ML IJ SOLN
INTRAMUSCULAR | Status: AC
  Filled 2021-03-17: qty 1

## 2021-03-17 MED ORDER — OXYCODONE HCL 5 MG PO TABS
5.0000 mg | ORAL_TABLET | ORAL | Status: DC | PRN
  Administered 2021-03-17 – 2021-03-18 (×8): 10 mg via ORAL
  Administered 2021-03-19: 5 mg via ORAL
  Administered 2021-03-19 (×2): 10 mg via ORAL
  Administered 2021-03-20: 5 mg via ORAL
  Administered 2021-03-20: 10 mg via ORAL
  Filled 2021-03-17: qty 1
  Filled 2021-03-17 (×7): qty 2
  Filled 2021-03-17: qty 1
  Filled 2021-03-17 (×5): qty 2

## 2021-03-17 MED ORDER — APIXABAN 2.5 MG PO TABS
2.5000 mg | ORAL_TABLET | Freq: Two times a day (BID) | ORAL | Status: DC
  Administered 2021-03-18 – 2021-03-20 (×5): 2.5 mg via ORAL
  Filled 2021-03-17 (×5): qty 1

## 2021-03-17 MED ORDER — ONDANSETRON HCL 4 MG/2ML IJ SOLN
INTRAMUSCULAR | Status: AC
  Filled 2021-03-17: qty 2

## 2021-03-17 MED ORDER — BUPIVACAINE IN DEXTROSE 0.75-8.25 % IT SOLN
INTRATHECAL | Status: DC | PRN
  Administered 2021-03-17: 1.4 mL via INTRATHECAL

## 2021-03-17 MED ORDER — PROPOFOL 1000 MG/100ML IV EMUL
INTRAVENOUS | Status: AC
  Filled 2021-03-17: qty 100

## 2021-03-17 MED ORDER — TRAMADOL HCL 50 MG PO TABS
50.0000 mg | ORAL_TABLET | Freq: Every day | ORAL | Status: DC
Start: 2021-03-17 — End: 2021-03-20
  Administered 2021-03-18 – 2021-03-19 (×2): 50 mg via ORAL
  Filled 2021-03-17 (×2): qty 1

## 2021-03-17 MED ORDER — TRANEXAMIC ACID-NACL 1000-0.7 MG/100ML-% IV SOLN
1000.0000 mg | Freq: Once | INTRAVENOUS | Status: AC
  Administered 2021-03-17: 1000 mg via INTRAVENOUS
  Filled 2021-03-17: qty 100

## 2021-03-17 MED ORDER — CHLORHEXIDINE GLUCONATE 0.12 % MT SOLN: 15.0000 mL | Freq: Once | OROMUCOSAL | Status: AC

## 2021-03-17 MED ORDER — CHLORHEXIDINE GLUCONATE CLOTH 2 % EX PADS: 6.0000 | MEDICATED_PAD | Freq: Every day | CUTANEOUS | Status: DC

## 2021-03-17 MED ORDER — EPHEDRINE SULFATE-NACL 50-0.9 MG/10ML-% IV SOSY
PREFILLED_SYRINGE | INTRAVENOUS | Status: DC | PRN
  Administered 2021-03-17 (×2): 5 mg via INTRAVENOUS
  Administered 2021-03-17: 10 mg via INTRAVENOUS

## 2021-03-17 MED ORDER — MENTHOL 3 MG MT LOZG: 1.0000 | LOZENGE | OROMUCOSAL | Status: DC | PRN

## 2021-03-17 MED ORDER — TRANEXAMIC ACID-NACL 1000-0.7 MG/100ML-% IV SOLN
1000.0000 mg | INTRAVENOUS | Status: AC
  Administered 2021-03-17: 1000 mg via INTRAVENOUS

## 2021-03-17 MED ORDER — GABAPENTIN 300 MG PO CAPS
600.0000 mg | ORAL_CAPSULE | Freq: Every day | ORAL | Status: DC
  Administered 2021-03-17 – 2021-03-19 (×3): 600 mg via ORAL
  Filled 2021-03-17 (×3): qty 2

## 2021-03-17 MED ORDER — FENTANYL CITRATE (PF) 100 MCG/2ML IJ SOLN
INTRAMUSCULAR | Status: AC
  Filled 2021-03-17: qty 2

## 2021-03-17 MED ORDER — CEFAZOLIN SODIUM-DEXTROSE 2-4 GM/100ML-% IV SOLN
INTRAVENOUS | Status: AC
  Filled 2021-03-17: qty 100

## 2021-03-17 MED ORDER — SODIUM CHLORIDE (PF) 0.9 % IJ SOLN
INTRAMUSCULAR | Status: AC
  Filled 2021-03-17: qty 30

## 2021-03-17 MED ORDER — VITAMIN D 25 MCG (1000 UNIT) PO TABS
1000.0000 [IU] | ORAL_TABLET | Freq: Every day | ORAL | Status: DC
  Administered 2021-03-18 – 2021-03-20 (×3): 1000 [IU] via ORAL
  Filled 2021-03-17 (×3): qty 1

## 2021-03-17 MED ORDER — ONDANSETRON HCL 4 MG PO TABS: 4.0000 mg | ORAL_TABLET | Freq: Three times a day (TID) | ORAL | 1 refills | Status: DC | PRN

## 2021-03-17 MED ORDER — CEFAZOLIN SODIUM-DEXTROSE 2-4 GM/100ML-% IV SOLN
2.0000 g | INTRAVENOUS | Status: AC
  Administered 2021-03-17: 2 g via INTRAVENOUS

## 2021-03-17 MED ORDER — OXYCODONE-ACETAMINOPHEN 5-325 MG PO TABS: 1.0000 | ORAL_TABLET | ORAL | 0 refills | Status: DC | PRN

## 2021-03-17 MED ORDER — TRANEXAMIC ACID-NACL 1000-0.7 MG/100ML-% IV SOLN
INTRAVENOUS | Status: AC
  Filled 2021-03-17: qty 100

## 2021-03-17 MED ORDER — BISACODYL 10 MG RE SUPP: 10.0000 mg | Freq: Every day | RECTAL | Status: DC | PRN

## 2021-03-17 MED ORDER — ACETAMINOPHEN 500 MG PO TABS
ORAL_TABLET | ORAL | Status: AC
  Administered 2021-03-17: 1000 mg via ORAL
  Filled 2021-03-17: qty 2

## 2021-03-17 MED ORDER — FENTANYL CITRATE (PF) 100 MCG/2ML IJ SOLN
25.0000 ug | INTRAMUSCULAR | Status: DC | PRN
  Administered 2021-03-17: 50 ug via INTRAVENOUS

## 2021-03-17 MED ORDER — SODIUM CHLORIDE 0.9 % IV SOLN: INTRAVENOUS | Status: DC

## 2021-03-17 MED ORDER — 0.9 % SODIUM CHLORIDE (POUR BTL) OPTIME
TOPICAL | Status: DC | PRN
  Administered 2021-03-17: 1000 mL

## 2021-03-17 MED ORDER — ACETAMINOPHEN 500 MG PO TABS: 1000.0000 mg | ORAL_TABLET | Freq: Once | ORAL | Status: AC

## 2021-03-17 MED ORDER — FAMOTIDINE 20 MG PO TABS: 20.0000 mg | ORAL_TABLET | Freq: Every day | ORAL | Status: DC | PRN

## 2021-03-17 MED ORDER — SODIUM CHLORIDE (PF) 0.9 % IJ SOLN
INTRAMUSCULAR | Status: DC | PRN
  Administered 2021-03-17: 30 mL

## 2021-03-17 MED ORDER — DEXAMETHASONE SODIUM PHOSPHATE 10 MG/ML IJ SOLN
INTRAMUSCULAR | Status: DC | PRN
  Administered 2021-03-17: 10 mg via INTRAVENOUS

## 2021-03-17 MED ORDER — FENTANYL CITRATE (PF) 100 MCG/2ML IJ SOLN
INTRAMUSCULAR | Status: DC | PRN
  Administered 2021-03-17 (×2): 50 ug via INTRAVENOUS

## 2021-03-17 MED ORDER — METHOCARBAMOL 500 MG IVPB - SIMPLE MED
500.0000 mg | Freq: Four times a day (QID) | INTRAVENOUS | Status: DC | PRN
  Filled 2021-03-17: qty 50

## 2021-03-17 MED ORDER — APIXABAN 2.5 MG PO TABS: 2.5000 mg | ORAL_TABLET | Freq: Two times a day (BID) | ORAL | 0 refills | Status: DC

## 2021-03-17 MED ORDER — DOCUSATE SODIUM 100 MG PO CAPS
100.0000 mg | ORAL_CAPSULE | Freq: Two times a day (BID) | ORAL | Status: DC
  Administered 2021-03-17 – 2021-03-20 (×6): 100 mg via ORAL
  Filled 2021-03-17 (×6): qty 1

## 2021-03-17 MED ORDER — ADULT MULTIVITAMIN W/MINERALS CH
1.0000 | ORAL_TABLET | Freq: Every day | ORAL | Status: DC
  Administered 2021-03-18 – 2021-03-20 (×3): 1 via ORAL
  Filled 2021-03-17 (×3): qty 1

## 2021-03-17 MED ORDER — BUPIVACAINE LIPOSOME 1.3 % IJ SUSP
INTRAMUSCULAR | Status: DC | PRN
  Administered 2021-03-17: 20 mL

## 2021-03-17 MED ORDER — PROPOFOL 500 MG/50ML IV EMUL
INTRAVENOUS | Status: DC | PRN
  Administered 2021-03-17: 75 ug/kg/min via INTRAVENOUS

## 2021-03-17 MED ORDER — WATER FOR IRRIGATION, STERILE IR SOLN
Status: DC | PRN
  Administered 2021-03-17: 2000 mL

## 2021-03-17 MED ORDER — POLYETHYLENE GLYCOL 3350 17 G PO PACK: 17.0000 g | PACK | Freq: Every day | ORAL | Status: DC | PRN

## 2021-03-17 MED ORDER — HYDROMORPHONE HCL 1 MG/ML IJ SOLN
0.5000 mg | INTRAMUSCULAR | Status: DC | PRN
  Administered 2021-03-17 – 2021-03-18 (×4): 1 mg via INTRAVENOUS
  Filled 2021-03-17 (×4): qty 1

## 2021-03-17 MED ORDER — MIDAZOLAM HCL 5 MG/5ML IJ SOLN
INTRAMUSCULAR | Status: DC | PRN
Start: 2021-03-17 — End: 2021-03-17
  Administered 2021-03-17 (×2): 1 mg via INTRAVENOUS

## 2021-03-17 MED ORDER — LACTATED RINGERS IV SOLN: INTRAVENOUS | Status: DC

## 2021-03-17 MED ORDER — ONDANSETRON HCL 4 MG/2ML IJ SOLN: 4.0000 mg | Freq: Once | INTRAMUSCULAR | Status: DC | PRN

## 2021-03-17 MED ORDER — MIDAZOLAM HCL 2 MG/2ML IJ SOLN
INTRAMUSCULAR | Status: AC
  Filled 2021-03-17: qty 2

## 2021-03-17 MED ORDER — PHENOL 1.4 % MT LIQD: 1.0000 | OROMUCOSAL | Status: DC | PRN

## 2021-03-17 MED ORDER — CLONIDINE HCL (ANALGESIA) 100 MCG/ML EP SOLN
EPIDURAL | Status: DC | PRN
  Administered 2021-03-17: 50 ug

## 2021-03-17 MED ORDER — ORAL CARE MOUTH RINSE
15.0000 mL | Freq: Once | OROMUCOSAL | Status: AC
  Administered 2021-03-17: 15 mL via OROMUCOSAL

## 2021-03-17 MED ORDER — POVIDONE-IODINE 10 % EX SWAB
2.0000 "application " | Freq: Once | CUTANEOUS | Status: AC
  Administered 2021-03-17: 2 via TOPICAL

## 2021-03-17 MED ORDER — ACETAMINOPHEN 325 MG PO TABS
325.0000 mg | ORAL_TABLET | Freq: Four times a day (QID) | ORAL | Status: DC | PRN
  Administered 2021-03-18 – 2021-03-20 (×3): 650 mg via ORAL
  Filled 2021-03-17 (×3): qty 2

## 2021-03-17 MED ORDER — BUPIVACAINE-EPINEPHRINE 0.25% -1:200000 IJ SOLN
INTRAMUSCULAR | Status: DC | PRN
  Administered 2021-03-17: 30 mL

## 2021-03-17 MED ORDER — FERROUS SULFATE 325 (65 FE) MG PO TABS
325.0000 mg | ORAL_TABLET | Freq: Three times a day (TID) | ORAL | Status: DC
  Administered 2021-03-18 – 2021-03-20 (×7): 325 mg via ORAL
  Filled 2021-03-17 (×7): qty 1

## 2021-03-17 MED ORDER — PROPOFOL 10 MG/ML IV BOLUS
INTRAVENOUS | Status: DC | PRN
  Administered 2021-03-17: 20 mg via INTRAVENOUS
  Administered 2021-03-17: 10 mg via INTRAVENOUS
  Administered 2021-03-17: 20 mg via INTRAVENOUS

## 2021-03-17 MED ORDER — METHOCARBAMOL 500 MG PO TABS
500.0000 mg | ORAL_TABLET | Freq: Four times a day (QID) | ORAL | Status: DC | PRN
  Administered 2021-03-17 – 2021-03-20 (×10): 500 mg via ORAL
  Filled 2021-03-17 (×10): qty 1

## 2021-03-17 MED ORDER — ONDANSETRON HCL 4 MG PO TABS: 4.0000 mg | ORAL_TABLET | Freq: Four times a day (QID) | ORAL | Status: DC | PRN

## 2021-03-17 MED ORDER — ONDANSETRON HCL 4 MG/2ML IJ SOLN: 4.0000 mg | Freq: Four times a day (QID) | INTRAMUSCULAR | Status: DC | PRN

## 2021-03-17 MED ORDER — METOCLOPRAMIDE HCL 5 MG PO TABS: 5.0000 mg | ORAL_TABLET | Freq: Three times a day (TID) | ORAL | Status: DC | PRN

## 2021-03-17 MED ORDER — EPHEDRINE 5 MG/ML INJ
INTRAVENOUS | Status: AC
  Filled 2021-03-17: qty 5

## 2021-03-17 MED ORDER — ALBUTEROL SULFATE (2.5 MG/3ML) 0.083% IN NEBU: 3.0000 mL | INHALATION_SOLUTION | Freq: Four times a day (QID) | RESPIRATORY_TRACT | Status: DC | PRN

## 2021-03-17 MED ORDER — ONDANSETRON HCL 4 MG/2ML IJ SOLN
INTRAMUSCULAR | Status: DC | PRN
  Administered 2021-03-17: 4 mg via INTRAVENOUS

## 2021-03-17 MED ORDER — FENTANYL CITRATE (PF) 100 MCG/2ML IJ SOLN
INTRAMUSCULAR | Status: AC
  Administered 2021-03-17: 50 ug via INTRAVENOUS
  Filled 2021-03-17: qty 2

## 2021-03-17 MED ORDER — CEFAZOLIN SODIUM-DEXTROSE 2-4 GM/100ML-% IV SOLN
2.0000 g | Freq: Four times a day (QID) | INTRAVENOUS | Status: AC
  Administered 2021-03-17 (×2): 2 g via INTRAVENOUS
  Filled 2021-03-17 (×2): qty 100

## 2021-03-17 MED ORDER — ROPIVACAINE HCL 5 MG/ML IJ SOLN
INTRAMUSCULAR | Status: DC | PRN
  Administered 2021-03-17: 30 mL via PERINEURAL

## 2021-03-17 MED ORDER — METHOCARBAMOL 500 MG PO TABS: 500.0000 mg | ORAL_TABLET | Freq: Four times a day (QID) | ORAL | 1 refills | Status: DC | PRN

## 2021-03-17 MED ORDER — CALCIUM CARBONATE ANTACID 500 MG PO CHEW: 1.0000 | CHEWABLE_TABLET | Freq: Every day | ORAL | Status: DC | PRN

## 2021-03-17 MED ORDER — METOCLOPRAMIDE HCL 5 MG/ML IJ SOLN: 5.0000 mg | Freq: Three times a day (TID) | INTRAMUSCULAR | Status: DC | PRN

## 2021-03-17 SURGICAL SUPPLY — 59 items
ATTUNE MED DOME PAT 32 KNEE (Knees) ×2 IMPLANT
ATTUNE MED DOME PAT 32MM KNEE (Knees) ×1 IMPLANT
ATTUNE PSFEM LTSZ5 NARCEM KNEE (Femur) ×3 IMPLANT
ATTUNE PSRP INSR SZ5 6 KNEE (Insert) ×2 IMPLANT
ATTUNE PSRP INSR SZ5 6MM KNEE (Insert) ×1 IMPLANT
BAG COUNTER SPONGE SURGICOUNT (BAG) IMPLANT
BAG SPEC THK2 15X12 ZIP CLS (MISCELLANEOUS)
BAG SPNG CNTER NS LX DISP (BAG)
BAG SURGICOUNT SPONGE COUNTING (BAG)
BAG ZIPLOCK 12X15 (MISCELLANEOUS) IMPLANT
BASEPLATE TIBIAL ROTATING SZ 4 (Knees) ×3 IMPLANT
BLADE SAG 18X100X1.27 (BLADE) ×3 IMPLANT
BLADE SAW SGTL 13X75X1.27 (BLADE) ×3 IMPLANT
BNDG CMPR MED 10X6 ELC LF (GAUZE/BANDAGES/DRESSINGS) ×1
BNDG ELASTIC 6X10 VLCR STRL LF (GAUZE/BANDAGES/DRESSINGS) ×3 IMPLANT
BNDG ELASTIC 6X5.8 VLCR STR LF (GAUZE/BANDAGES/DRESSINGS) ×3 IMPLANT
BNDG GAUZE ELAST 4 BULKY (GAUZE/BANDAGES/DRESSINGS) ×3 IMPLANT
BOWL SMART MIX CTS (DISPOSABLE) ×3 IMPLANT
BSPLAT TIB 4 CMNT ROT PLAT STR (Knees) ×1 IMPLANT
CEMENT HV SMART SET (Cement) ×6 IMPLANT
CLOSURE WOUND 1/2 X4 (GAUZE/BANDAGES/DRESSINGS) ×2
COVER SURGICAL LIGHT HANDLE (MISCELLANEOUS) ×3 IMPLANT
CUFF TOURN SGL QUICK 34 (TOURNIQUET CUFF) ×3
CUFF TRNQT CYL 34X4.125X (TOURNIQUET CUFF) ×1 IMPLANT
DRAPE SHEET LG 3/4 BI-LAMINATE (DRAPES) ×3 IMPLANT
DRAPE U-SHAPE 47X51 STRL (DRAPES) ×3 IMPLANT
DRSG ADAPTIC 3X8 NADH LF (GAUZE/BANDAGES/DRESSINGS) ×3 IMPLANT
DRSG PAD ABDOMINAL 8X10 ST (GAUZE/BANDAGES/DRESSINGS) ×3 IMPLANT
DURAPREP 26ML APPLICATOR (WOUND CARE) ×3 IMPLANT
ELECT REM PT RETURN 15FT ADLT (MISCELLANEOUS) ×3 IMPLANT
GAUZE SPONGE 4X4 12PLY STRL (GAUZE/BANDAGES/DRESSINGS) ×3 IMPLANT
GLOVE SURG ORTHO LTX SZ7.5 (GLOVE) ×3 IMPLANT
GLOVE SURG ORTHO LTX SZ8.5 (GLOVE) ×3 IMPLANT
GLOVE SURG UNDER POLY LF SZ7.5 (GLOVE) ×3 IMPLANT
GLOVE SURG UNDER POLY LF SZ8.5 (GLOVE) ×3 IMPLANT
GOWN STRL REUS W/TWL XL LVL3 (GOWN DISPOSABLE) ×6 IMPLANT
HANDPIECE INTERPULSE COAX TIP (DISPOSABLE) ×3
HOLDER FOLEY CATH W/STRAP (MISCELLANEOUS) IMPLANT
IMMOBILIZER KNEE 20 (SOFTGOODS)
IMMOBILIZER KNEE 20 THIGH 36 (SOFTGOODS) IMPLANT
IMMOBILIZER KNEE 22 UNIV (SOFTGOODS) ×3 IMPLANT
KIT TURNOVER KIT A (KITS) ×3 IMPLANT
MANIFOLD NEPTUNE II (INSTRUMENTS) ×3 IMPLANT
NS IRRIG 1000ML POUR BTL (IV SOLUTION) ×3 IMPLANT
PACK TOTAL KNEE CUSTOM (KITS) ×3 IMPLANT
PENCIL SMOKE EVACUATOR (MISCELLANEOUS) IMPLANT
PIN DRILL FIX HALF THREAD (BIT) ×3 IMPLANT
PIN STEINMAN FIXATION KNEE (PIN) ×3 IMPLANT
PROTECTOR NERVE ULNAR (MISCELLANEOUS) ×3 IMPLANT
SET HNDPC FAN SPRY TIP SCT (DISPOSABLE) ×1 IMPLANT
STAPLER VISISTAT 35W (STAPLE) IMPLANT
STRIP CLOSURE SKIN 1/2X4 (GAUZE/BANDAGES/DRESSINGS) ×4 IMPLANT
SUT MNCRL AB 3-0 PS2 18 (SUTURE) ×3 IMPLANT
SUT VIC AB 0 CT1 36 (SUTURE) ×3 IMPLANT
SUT VIC AB 1 CT1 36 (SUTURE) ×9 IMPLANT
SUT VIC AB 2-0 CT1 27 (SUTURE) ×3
SUT VIC AB 2-0 CT1 TAPERPNT 27 (SUTURE) ×1 IMPLANT
TRAY FOLEY MTR SLVR 16FR STAT (SET/KITS/TRAYS/PACK) ×3 IMPLANT
WATER STERILE IRR 1000ML POUR (IV SOLUTION) ×6 IMPLANT

## 2021-03-17 NOTE — Anesthesia Procedure Notes (Signed)
Anesthesia Regional Block: Adductor canal block   Pre-Anesthetic Checklist: , timeout performed,  Correct Patient, Correct Site, Correct Laterality,  Correct Procedure, Correct Position, site marked,  Risks and benefits discussed,  Surgical consent,  Pre-op evaluation,  At surgeon's request and post-op pain management  Laterality: Left  Prep: chloraprep       Needles:  Injection technique: Single-shot  Needle Type: Echogenic Needle     Needle Length: 9cm  Needle Gauge: 21     Additional Needles:   Procedures:,,,, ultrasound used (permanent image in chart),,    Narrative:  Start time: 03/17/2021 6:54 AM End time: 03/17/2021 7:02 AM Injection made incrementally with aspirations every 5 mL.  Performed by: Personally  Anesthesiologist: Catalina Gravel, MD  Additional Notes: No pain on injection. No increased resistance to injection. Injection made in 5cc increments.  Good needle visualization.  Patient tolerated procedure well.

## 2021-03-17 NOTE — Discharge Instructions (Addendum)
Ice to the knee constantly.  Keep the incision covered and clean and dry for one week, then ok to get it wet in the shower.  Do exercise as instructed every hour, please to prevent stiffness.    DO NOT prop anything under the knee, it will make your knee stiff.  Prop under the ankle to encourage your knee to go straight.   Use the walker while you are up and around for balance.  Wear your support stockings 24/7 to prevent blood clots and take eliquis as instructed for 30 days also to prevent blood clots  Follow up with Dr Veverly Fells in two weeks in the office, call [phone] for appt   INSTRUCTIONS AFTER JOINT REPLACEMENT   Remove items at home which could result in a fall. This includes throw rugs or furniture in walking pathways ICE to the affected joint every three hours while awake for 30 minutes at a time, for at least the first 3-5 days, and then as needed for pain and swelling.  Continue to use ice for pain and swelling. You may notice swelling that will progress down to the foot and ankle.  This is normal after surgery.  Elevate your leg when you are not up walking on it.   Continue to use the breathing machine you got in the hospital (incentive spirometer) which will help keep your temperature down.  It is common for your temperature to cycle up and down following surgery, especially at night when you are not up moving around and exerting yourself.  The breathing machine keeps your lungs expanded and your temperature down.   DIET:  As you were doing prior to hospitalization, we recommend a well-balanced diet.  DRESSING / WOUND CARE / SHOWERING  You may change your dressing 3-5 days after surgery.  Then change the dressing every day with sterile gauze.  Please use good hand washing techniques before changing the dressing.  Do not use any lotions or creams on the incision until instructed by your surgeon.  ACTIVITY  Increase activity slowly as tolerated, but follow the weight bearing  instructions below.   No driving for 6 weeks or until further direction given by your physician.  You cannot drive while taking narcotics.  No lifting or carrying greater than 10 lbs. until further directed by your surgeon. Avoid periods of inactivity such as sitting longer than an hour when not asleep. This helps prevent blood clots.  You may return to work once you are authorized by your doctor.     WEIGHT BEARING   Weight bearing as tolerated with assist device (walker, cane, etc) as directed, use it as long as suggested by your surgeon or therapist, typically at least 4-6 weeks.   EXERCISES  Results after joint replacement surgery are often greatly improved when you follow the exercise, range of motion and muscle strengthening exercises prescribed by your doctor. Safety measures are also important to protect the joint from further injury. Any time any of these exercises cause you to have increased pain or swelling, decrease what you are doing until you are comfortable again and then slowly increase them. If you have problems or questions, call your caregiver or physical therapist for advice.   Rehabilitation is important following a joint replacement. After just a few days of immobilization, the muscles of the leg can become weakened and shrink (atrophy).  These exercises are designed to build up the tone and strength of the thigh and leg muscles and to improve  improve motion. Often times heat used for twenty to thirty minutes before working out will loosen up your tissues and help with improving the range of motion but do not use heat for the first two weeks following surgery (sometimes heat can increase post-operative swelling).   These exercises can be done on a training (exercise) mat, on the floor, on a table or on a bed. Use whatever works the best and is most comfortable for you.    Use music or television while you are exercising so that the exercises are a pleasant break in your day.  This will make your life better with the exercises acting as a break in your routine that you can look forward to.   Perform all exercises about fifteen times, three times per day or as directed.  You should exercise both the operative leg and the other leg as well.  Exercises include:   Quad Sets - Tighten up the muscle on the front of the thigh (Quad) and hold for 5-10 seconds.   Straight Leg Raises - With your knee straight (if you were given a brace, keep it on), lift the leg to 60 degrees, hold for 3 seconds, and slowly lower the leg.  Perform this exercise against resistance later as your leg gets stronger.  Leg Slides: Lying on your back, slowly slide your foot toward your buttocks, bending your knee up off the floor (only go as far as is comfortable). Then slowly slide your foot back down until your leg is flat on the floor again.  Angel Wings: Lying on your back spread your legs to the side as far apart as you can without causing discomfort.  Hamstring Strength:  Lying on your back, push your heel against the floor with your leg straight by tightening up the muscles of your buttocks.  Repeat, but this time bend your knee to a comfortable angle, and push your heel against the floor.  You may put a pillow under the heel to make it more comfortable if necessary.   A rehabilitation program following joint replacement surgery can speed recovery and prevent re-injury in the future due to weakened muscles. Contact your doctor or a physical therapist for more information on knee rehabilitation.    CONSTIPATION  Constipation is defined medically as fewer than three stools per week and severe constipation as less than one stool per week.  Even if you have a regular bowel pattern at home, your normal regimen is likely to be disrupted due to multiple reasons following surgery.  Combination of anesthesia, postoperative narcotics, change in appetite and fluid intake all can affect your bowels.   YOU MUST  use at least one of the following options; they are listed in order of increasing strength to get the job done.  They are all available over the counter, and you may need to use some, POSSIBLY even all of these options:    Drink plenty of fluids (prune juice may be helpful) and high fiber foods Colace 100 mg by mouth twice a day  Senokot for constipation as directed and as needed Dulcolax (bisacodyl), take with full glass of water  Miralax (polyethylene glycol) once or twice a day as needed.  If you have tried all these things and are unable to have a bowel movement in the first 3-4 days after surgery call either your surgeon or your primary doctor.    If you experience loose stools or diarrhea, hold the medications until you stool forms back   up.  If your symptoms do not get better within 1 week or if they get worse, check with your doctor.  If you experience "the worst abdominal pain ever" or develop nausea or vomiting, please contact the office immediately for further recommendations for treatment.   ITCHING:  If you experience itching with your medications, try taking only a single pain pill, or even half a pain pill at a time.  You can also use Benadryl over the counter for itching or also to help with sleep.   TED HOSE STOCKINGS:  Use stockings on both legs until for at least 2 weeks or as directed by physician office. They may be removed at night for sleeping.  MEDICATIONS:  See your medication summary on the "After Visit Summary" that nursing will review with you.  You may have some home medications which will be placed on hold until you complete the course of blood thinner medication.  It is important for you to complete the blood thinner medication as prescribed.  PRECAUTIONS:  If you experience chest pain or shortness of breath - call 911 immediately for transfer to the hospital emergency department.   If you develop a fever greater that 101 F, purulent drainage from wound, increased  redness or drainage from wound, foul odor from the wound/dressing, or calf pain - CONTACT YOUR SURGEON.                                                   FOLLOW-UP APPOINTMENTS:  If you do not already have a post-op appointment, please call the office for an appointment to be seen by your surgeon.  Guidelines for how soon to be seen are listed in your "After Visit Summary", but are typically between 1-4 weeks after surgery.  OTHER INSTRUCTIONS:   Knee Replacement:  Do not place pillow under knee, focus on keeping the knee straight while resting. CPM instructions: 0-90 degrees, 2 hours in the morning, 2 hours in the afternoon, and 2 hours in the evening. Place foam block, curve side up under heel at all times except when in CPM or when walking.  DO NOT modify, tear, cut, or change the foam block in any way.  POST-OPERATIVE OPIOID TAPER INSTRUCTIONS: It is important to wean off of your opioid medication as soon as possible. If you do not need pain medication after your surgery it is ok to stop day one. Opioids include: Codeine, Hydrocodone(Norco, Vicodin), Oxycodone(Percocet, oxycontin) and hydromorphone amongst others.  Long term and even short term use of opiods can cause: Increased pain response Dependence Constipation Depression Respiratory depression And more.  Withdrawal symptoms can include Flu like symptoms Nausea, vomiting And more Techniques to manage these symptoms Hydrate well Eat regular healthy meals Stay active Use relaxation techniques(deep breathing, meditating, yoga) Do Not substitute Alcohol to help with tapering If you have been on opioids for less than two weeks and do not have pain than it is ok to stop all together.  Plan to wean off of opioids This plan should start within one week post op of your joint replacement. Maintain the same interval or time between taking each dose and first decrease the dose.  Cut the total daily intake of opioids by one tablet each  day Next start to increase the time between doses. The last dose that should be eliminated is   dose.   MAKE SURE YOU:  Understand these instructions.  Get help right away if you are not doing well or get worse.    Thank you for letting us be a part of your medical care team.  It is a privilege we respect greatly.  We hope these instructions will help you stay on track for a fast and full recovery!     Information on my medicine - ELIQUIS (apixaban)  This medication education was reviewed with me or my healthcare representative as part of my discharge preparation.  The pharmacist that spoke with me during my hospital stay was:  Emiliano Dyer, [facility]  Why was Eliquis prescribed for you? Eliquis was prescribed for you to reduce the risk of blood clots forming after orthopedic surgery.    What do You need to know about Eliquis? Take your Eliquis TWICE DAILY - one tablet in the morning and one tablet in the evening with or without food.  It would be best to take the dose about the same time each day.  If you have difficulty swallowing the tablet whole please discuss with your pharmacist how to take the medication safely.  Take Eliquis exactly as prescribed by your doctor and DO NOT stop taking Eliquis without talking to the doctor who prescribed the medication.  Stopping without other medication to take the place of Eliquis may increase your risk of developing a clot.  After discharge, you should have regular check-up appointments with your healthcare provider that is prescribing your Eliquis.  What do you do if you miss a dose? If a dose of ELIQUIS is not taken at the scheduled time, take it as soon as possible on the same day and twice-daily administration should be resumed.  The dose should not be doubled to make up for a missed dose.  Do not take more than one tablet of ELIQUIS at the same time.  Important Safety Information A possible side effect of Eliquis is  bleeding. You should call your healthcare provider right away if you experience any of the following: Bleeding from an injury or your nose that does not stop. Unusual colored urine (red or dark brown) or unusual colored stools (red or black). Unusual bruising for unknown reasons. A serious fall or if you hit your head (even if there is no bleeding).  Some medicines may interact with Eliquis and might increase your risk of bleeding or clotting while on Eliquis. To help avoid this, consult your healthcare provider or pharmacist prior to using any new prescription or non-prescription medications, including herbals, vitamins, non-steroidal anti-inflammatory drugs (NSAIDs) and supplements.  This website has more information on Eliquis (apixaban): http://www.eliquis.com/eliquis/home

## 2021-03-17 NOTE — Op Note (Signed)
NAMEKHYLA, TROCHE MEDICAL RECORD NO: [id number] ACCOUNT NO: [id number] DATE OF BIRTH: Dec 29, 1950 FACILITY: [facility] LOCATION: [id number] PHYSICIAN: Doran Heater. Veverly Fells, MD  Operative Report   DATE OF PROCEDURE: 03/17/2021  PREOPERATIVE DIAGNOSIS:  Left knee end-stage osteoarthritis.  POSTOPERATIVE DIAGNOSIS:  Left knee end-stage osteoarthritis.  PROCEDURE PERFORMED:  Left total knee replacement using DePuy Attune prosthesis.  ATTENDING SURGEON:  Doran Heater. Veverly Fells, MD  ASSISTANT:  Shelle Iron, PA-C, who was scrubbed during the entire procedure, and necessary for satisfactory completion of surgery.  ANESTHESIA:  Spinal anesthesia plus adductor canal block was used.  ESTIMATED BLOOD LOSS:  Minimal.  FLUID:  Placed, 1500 mL crystalloid.  COUNTS:  Instrument counts were correct.  COMPLICATIONS:  There were no complications.  Perioperative antibiotics were given.  TOURNIQUET TIME:  1 hour and 10 minutes at 300 mmHg.  INDICATIONS:  The patient is a 70 year old female with worsening left knee pain secondary to end-stage osteoarthritis.  The patient has a valgus knee.  She is bone-on-bone and has valgus pseudolaxity.  The patient has progressed in terms of her pain and  her functional problems with the knee, the patient has previously done well with a right knee replacement.  She presents for total knee arthroplasty to restore mechanical alignment, to restore function and eliminate pain.  Informed consent obtained.  DESCRIPTION OF PROCEDURE:  After an adequate level of anesthesia was achieved, the patient positioned in the supine position.  Left leg correctly identified a nonsterile tourniquet placed on the proximal thigh.  The left leg sterilely prepped and draped  in the usual manner.  Timeout called, verifying correct patient, correct site.  We exsanguinated the limb with an Esmarch bandage, elevated the tourniquet to 300 mmHg.  We placed the knee in flexion, performed a longitudinal midline  incision with a 10  blade scalpel dissection down through subcutaneous tissues.  We performed a medial parapatellar arthrotomy with a fresh 10 blade scalpel.  We then divided the lateral patellofemoral ligaments everting the patella and exposing the distal femur, which was  devoid of cartilage.  We entered the distal femur with a step cut drill.  We then placed our intramedullary guide set on 3 degrees left for this valgus knee and 9 mm resection off the distal femur.  Once we had the guide in place, we performed our distal  cut with the oscillating saw.  We then sized the femur to a size 5 anterior down performing anterior, posterior and chamfer cuts off the 4-in-1 block.  We then removed ACL, PCL and meniscal tissues subluxing the tibia anteriorly.  Again, cartilage was  completely gone from the upper tibia, especially laterally.  We then performed our tibial cut using the external alignment guide, resecting with minimal posterior slope for this posterior cruciate substituting prosthesis 90 degrees perpendicular to the  long axis of the tibia.  Once we performed that cut with 2 mm resection off the affected lateral side, we then checked our gaps, which were symmetric at 5 mm.  We then removed our pins from the tibia and then finished our tibial preparation with a  modular drill and keel punch for the size 4 tibia trying to externally rotate the component as much as possible relatively internally rotating the tibial tubercle.  Next, we completed our femoral preparation with the box cut for the size 5 left femur.   Once we had a box cut completed, we placed our trial femur in place, which fit well.  We then drilled  our lug holes and placed a 5 tibial poly trial in the knee.  We placed the knee in extension.  We were able to get the knee in full extension and had  good flexion stability.  We then resurfaced the patella going from a 21 mm thickness down to a 14 mm thickness.  We drilled the lug holes for the  32 patellar button and placed the trial 32 patellar button in place.  We had a little bit of lateral  maltracking, so we did a lateral release, which improved that.  At this point, we removed all trial components, pulse irrigated the knee, dried the bone and then vacuum mixed high viscosity cement on the back table.  We then cemented the components into  place, a size 4 tibia, 5 narrow left femur and then placed the 5, 5 spacer and placed in extension while the cement hardened.  We then also cemented the 32 patellar button in place with a clamp.  Once the cement was hardened, we removed excess cement  with 0.25 inch curved osteotome.  We were happy with our trial with a size 5 mm, but we felt like we go with a 6 so we selected the real size 5, 6 mm poly and placed that on the tibial tray and reduced the knee.  We had nice little pop was reduced under  the medial condyle.  We had a stable knee in flexion and extension.  Normal patellar tracking.  We irrigated thoroughly and then closed the parapatellar arthrotomy with #1 Vicryl suture, followed by 2-0 Vicryl for subcutaneous closure and 4-0 Monocryl  for skin.  Steri-Strips were applied followed by sterile dressing.  The patient was awakened and taken to the recovery room in stable condition, having tolerated surgery well.   PUS D: 03/17/2021 9:36:53 am T: 03/17/2021 5:19:00 pm  JOB: [id number]

## 2021-03-17 NOTE — Interval H&P Note (Signed)
History and Physical Interval Note:  03/17/2021 7:26 AM  [name]  has presented today for surgery, with the diagnosis of Left knee end stage osteoarthritis.  The various methods of treatment have been discussed with the patient and family. After consideration of risks, benefits and other options for treatment, the patient has consented to  Procedure(s): TOTAL KNEE ARTHROPLASTY (Left) as a surgical intervention.  The patient's history has been reviewed, patient examined, no change in status, stable for surgery.  I have reviewed the patient's chart and labs.  Questions were answered to the patient's satisfaction.     Augustin Schooling

## 2021-03-17 NOTE — Progress Notes (Signed)
Orthopedic Tech Progress Note Patient Details:  [name] 1951/01/13 [id number]  CPM Left Knee CPM Left Knee: On Left Knee Flexion (Degrees): 0 Left Knee Extension (Degrees): 60  Post Interventions Patient Tolerated: Well Instructions Provided: Care of device  [name] 03/17/2021, 9:50 AM

## 2021-03-17 NOTE — Transfer of Care (Signed)
Immediate Anesthesia Transfer of Care Note  Patient: [name]  Procedure(s) Performed: TOTAL KNEE ARTHROPLASTY (Left: Knee)  Patient Location: PACU  Anesthesia Type:Regional and Spinal  Level of Consciousness: awake, alert  and oriented  Airway & Oxygen Therapy: Patient Spontanous Breathing and Patient connected to face mask oxygen  Post-op Assessment: Report given to RN and Post -op Vital signs reviewed and stable  Post vital signs: Reviewed and stable  Last Vitals:  Vitals Value Taken Time  BP 115/59 03/17/21 0932  Temp    Pulse 89 03/17/21 0935  Resp 17 03/17/21 0935  SpO2 95 % 03/17/21 0935  Vitals shown include unvalidated device data.  Last Pain:  Vitals:   03/17/21 0559  TempSrc: Oral         Complications: No notable events documented.

## 2021-03-17 NOTE — Brief Op Note (Signed)
03/17/2021  9:29 AM  PATIENT:  [name]  70 y.o. female  PRE-OPERATIVE DIAGNOSIS:  Left knee end stage osteoarthritis  POST-OPERATIVE DIAGNOSIS:  Left knee end stage osteoarthritis  PROCEDURE:  Procedure(s): TOTAL KNEE ARTHROPLASTY (Left) DePuy Attune  SURGEON:  Surgeon(s) and Role:    Netta Cedars, MD - Primary  PHYSICIAN ASSISTANT:   ASSISTANTS: Ventura Bruns, PA-C   ANESTHESIA:   regional and general  EBL:  25 mL   BLOOD ADMINISTERED:none  DRAINS: none   LOCAL MEDICATIONS USED:  MARCAINE     SPECIMEN:  No Specimen  DISPOSITION OF SPECIMEN:  N/A  COUNTS:  YES  TOURNIQUET:   Total Tourniquet Time Documented: Thigh (Left) - 87 minutes Total: Thigh (Left) - 87 minutes   DICTATION: .Other Dictation: Dictation Number [id number]  PLAN OF CARE: Admit for overnight observation  PATIENT DISPOSITION:  PACU - hemodynamically stable.   Delay start of Pharmacological VTE agent (>24hrs) due to surgical blood loss or risk of bleeding: no

## 2021-03-17 NOTE — Anesthesia Procedure Notes (Signed)
Spinal  Patient location during procedure: OR End time: 03/17/2021 7:38 AM Reason for block: surgical anesthesia Staffing Performed: other anesthesia staff  Anesthesiologist: Nolon Nations, MD Resident/CRNA: Noralyn Pick D, CRNA Preanesthetic Checklist Completed: patient identified, IV checked, site marked, risks and benefits discussed, surgical consent, monitors and equipment checked, pre-op evaluation and timeout performed Spinal Block Patient position: sitting Prep: DuraPrep Patient monitoring: heart rate, continuous pulse ox and blood pressure Location: L3-4 Injection technique: single-shot Needle Needle type: Pencan  Needle gauge: 24 G Needle length: 9 cm Assessment Sensory level: T6 Events: CSF return Additional Notes

## 2021-03-17 NOTE — Evaluation (Signed)
Physical Therapy Evaluation Patient Details Name: [name] MRN: [id number] DOB: Aug 22, 1950 Today's Date: 03/17/2021   History of Present Illness  Pt s/p L TKR and with hx of R TKR,  nephrectomy, CKD, L reverse TSR  Clinical Impression  Pt s/p L TKR and presents with decreased L LE strength/ROM and post op pain limiting functional mobility.  Pt should progress to dc home with family assist and reports first OPPT scheduled for 03/21/21.    Follow Up Recommendations Follow surgeon's recommendation for DC plan and follow-up therapies    Equipment Recommendations  None recommended by PT    Recommendations for Other Services       Precautions / Restrictions Precautions Precautions: Knee;Fall Required Braces or Orthoses: Knee Immobilizer - Left Knee Immobilizer - Right: Discontinue once straight leg raise with < 10 degree lag Restrictions Weight Bearing Restrictions: No Other Position/Activity Restrictions: WBAT      Mobility  Bed Mobility Overal bed mobility: Needs Assistance Bed Mobility: Supine to Sit     Supine to sit: Min assist     General bed mobility comments: cues for sequence and use of R LE to self assist    Transfers Overall transfer level: Needs assistance Equipment used: Rolling walker (2 wheeled) Transfers: Sit to/from Stand Sit to Stand: Min assist;Mod assist         General transfer comment: cues for LE management and use of UEs to self assist  Ambulation/Gait Ambulation/Gait assistance: Min assist Gait Distance (Feet): 38 Feet Assistive device: Rolling walker (2 wheeled) Gait Pattern/deviations: Step-to pattern;Decreased step length - right;Decreased step length - left;Shuffle;Trunk flexed Gait velocity: decr   General Gait Details: cues for sequence, posture and position from [employer]            Wheelchair Mobility    Modified Rankin (Stroke Patients Only)       Balance Overall balance assessment: Needs  assistance Sitting-balance support: No upper extremity supported;Feet supported Sitting balance-Leahy Scale: Good     Standing balance support: Bilateral upper extremity supported Standing balance-Leahy Scale: Poor                               Pertinent Vitals/Pain Pain Assessment: 0-10 Pain Score: 4  Pain Location: L knee Pain Descriptors / Indicators: Aching;Sore Pain Intervention(s): Limited activity within patient's tolerance;Monitored during session;Premedicated before session;Ice applied    Home Living Family/patient expects to be discharged to:: Private residence Living Arrangements: Spouse/significant other Available Help at Discharge: Family Type of Home: House Home Access: Stairs to enter Entrance Stairs-Rails: Right Entrance Stairs-Number of Steps: 1+2+1 Home Layout: Able to live on main level with bedroom/bathroom Home Equipment: Walker - 2 wheels;Cane - single point;Crutches      Prior Function Level of Independence: Independent               Hand Dominance   Dominant Hand: Right    Extremity/Trunk Assessment   Upper Extremity Assessment Upper Extremity Assessment: Overall WFL for tasks assessed    Lower Extremity Assessment Lower Extremity Assessment: LLE deficits/detail    Cervical / Trunk Assessment Cervical / Trunk Assessment: Normal  Communication   Communication: No difficulties  Cognition Arousal/Alertness: Awake/alert Behavior During Therapy: WFL for tasks assessed/performed Overall Cognitive Status: Within Functional Limits for tasks assessed  General Comments      Exercises Total Joint Exercises Ankle Circles/Pumps: AROM;Both;15 reps;Supine   Assessment/Plan    PT Assessment Patient needs continued PT services  PT Problem List Decreased strength;Decreased range of motion;Decreased activity tolerance;Decreased balance;Decreased mobility;Decreased knowledge  of use of DME;Pain       PT Treatment Interventions DME instruction;Gait training;Stair training;Functional mobility training;Therapeutic activities;Therapeutic exercise;Patient/family education    PT Goals (Current goals can be found in the Care Plan section)  Acute Rehab PT Goals Patient Stated Goal: Regain IND PT Goal Formulation: With patient Time For Goal Achievement: 03/24/21 Potential to Achieve Goals: Good    Frequency 7X/week   Barriers to discharge        Co-evaluation               AM-PAC PT "6 Clicks" Mobility  Outcome Measure Help needed turning from your back to your side while in a flat bed without using bedrails?: A Little Help needed moving from lying on your back to sitting on the side of a flat bed without using bedrails?: A Lot Help needed moving to and from a bed to a chair (including a wheelchair)?: A Lot Help needed standing up from a chair using your arms (e.g., wheelchair or bedside chair)?: A Lot Help needed to walk in hospital room?: A Little Help needed climbing 3-5 steps with a railing? : A Lot 6 Click Score: 14    End of Session Equipment Utilized During Treatment: Gait belt;Left knee immobilizer Activity Tolerance: Patient tolerated treatment well Patient left: in chair;with call bell/phone within reach;with chair alarm set;with family/visitor present Nurse Communication: Mobility status PT Visit Diagnosis: Difficulty in walking, not elsewhere classified (R26.2)    Time: [id number] PT Time Calculation (min) (ACUTE ONLY): 34 min   Charges:   PT Evaluation $PT Eval Low Complexity: 1 Low PT Treatments $Gait Training: 8-22 mins        [place] Pager [phone] Office [phone]   Evalisse Prajapati 03/17/2021, 4:56 PM

## 2021-03-17 NOTE — Anesthesia Postprocedure Evaluation (Signed)
Anesthesia Post Note  Patient: [name]  Procedure(s) Performed: TOTAL KNEE ARTHROPLASTY (Left: Knee)     Patient location during evaluation: PACU Anesthesia Type: Spinal Level of consciousness: oriented, awake and alert and awake Pain management: pain level controlled Vital Signs Assessment: post-procedure vital signs reviewed and stable Respiratory status: spontaneous breathing, respiratory function stable and nonlabored ventilation Cardiovascular status: blood pressure returned to baseline and stable Postop Assessment: no headache, no backache, no apparent nausea or vomiting, patient able to bend at knees and spinal receding Anesthetic complications: no   No notable events documented.  Last Vitals:  Vitals:   03/17/21 1306 03/17/21 1734  BP: 125/74 134/72  Pulse: 78 69  Resp: 16 17  Temp: 36.5 C 36.5 C  SpO2: 100% 100%    Last Pain:  Vitals:   03/17/21 1821  TempSrc:   PainSc: [place]

## 2021-03-17 NOTE — Care Plan (Signed)
Ortho Bundle Case Management Note  Patient Details  Name: [name] MRN: [id number] Date of Birth: 12/23/1950                  L TKA on 03-17-21 DCP: Home with husband. 1 story home with 2 steps to enter. DME: No needs. Has a RW and 3-in-1. PT: EO. PT eval scheduled on 03-21-21.   DME Arranged:  N/A DME Agency:     HH Arranged:    HH Agency:     Additional Comments: Please contact me with any questions of if this plan should need to change.  Marianne Sofia, RN,CCM [facility]  [phone] 03/17/2021, 1:59 PM

## 2021-03-18 NOTE — Progress Notes (Signed)
Physical Therapy Treatment Patient Details Name: [name] MRN: [id number] DOB: 1950/10/10 Today's Date: 03/18/2021    History of Present Illness Pt s/p L TKR and with hx of R TKR,  nephrectomy, CKD, L reverse TSR    PT Comments     Pt very cooperative but limited by c/o fatigue and mild dizziness.  BP prior to ambulation 130/78.  After ambulating 44' to sit at bedside, BP 117/70 - RN aware.     Follow Up Recommendations  Follow surgeon's recommendation for DC plan and follow-up therapies     Equipment Recommendations  None recommended by PT    Recommendations for Other Services       Precautions / Restrictions Precautions Precautions: Knee;Fall Required Braces or Orthoses: Knee Immobilizer - Left Knee Immobilizer - Right: Discontinue once straight leg raise with < 10 degree lag Restrictions Weight Bearing Restrictions: No LLE Weight Bearing: Weight bearing as tolerated    Mobility  Bed Mobility Overal bed mobility: Needs Assistance Bed Mobility: Sit to Supine       Sit to supine: Min assist   General bed mobility comments: cues for sequence and use of R LE to self assist    Transfers Overall transfer level: Needs assistance Equipment used: Rolling walker (2 wheeled) Transfers: Sit to/from Stand Sit to Stand: Min assist         General transfer comment: cues for LE management and use of UEs to self assist  Ambulation/Gait Ambulation/Gait assistance: Min assist Gait Distance (Feet): 44 Feet Assistive device: Rolling walker (2 wheeled) Gait Pattern/deviations: Step-to pattern;Decreased step length - right;Decreased step length - left;Shuffle;Trunk flexed Gait velocity: decr   General Gait Details: cues for sequence, posture and position from RW; distance ltd by fatigue/mild dizziness   Stairs             Wheelchair Mobility    Modified Rankin (Stroke Patients Only)       Balance Overall balance assessment: Needs  assistance Sitting-balance support: No upper extremity supported;Feet supported Sitting balance-Leahy Scale: Good     Standing balance support: Bilateral upper extremity supported Standing balance-Leahy Scale: Poor                              Cognition Arousal/Alertness: Awake/alert Behavior During Therapy: WFL for tasks assessed/performed Overall Cognitive Status: Within Functional Limits for tasks assessed                                        Exercises      General Comments        Pertinent Vitals/Pain Pain Assessment: 0-10 Pain Score: 5  Pain Location: L knee Pain Descriptors / Indicators: Aching;Sore Pain Intervention(s): Limited activity within patient's tolerance;Monitored during session;Premedicated before session    Home Living                      Prior Function            PT Goals (current goals can now be found in the care plan section) Acute Rehab PT Goals Patient Stated Goal: Regain IND PT Goal Formulation: With patient Time For Goal Achievement: 03/24/21 Potential to Achieve Goals: Good Progress towards PT goals: Progressing toward goals    Frequency    7X/week      PT Plan Current plan remains appropriate  Co-evaluation              AM-PAC PT "6 Clicks" Mobility   Outcome Measure  Help needed turning from your back to your side while in a flat bed without using bedrails?: A Little Help needed moving from lying on your back to sitting on the side of a flat bed without using bedrails?: A Little Help needed moving to and from a bed to a chair (including a wheelchair)?: A Little Help needed standing up from a chair using your arms (e.g., wheelchair or bedside chair)?: A Little Help needed to walk in hospital room?: A Little Help needed climbing 3-5 steps with a railing? : A Lot 6 Click Score: 17    End of Session Equipment Utilized During Treatment: Gait belt;Left knee immobilizer Activity  Tolerance: Patient limited by fatigue Patient left: in bed;with call bell/phone within reach;with nursing/sitter in room;with family/visitor present Nurse Communication: Mobility status PT Visit Diagnosis: Difficulty in walking, not elsewhere classified (R26.2)     Time: [id number] PT Time Calculation (min) (ACUTE ONLY): 16 min  Charges:  $Gait Training: 8-22 mins                     [place] Pager [phone] Office [phone]    Surie Suchocki 03/18/2021, 4:28 PM

## 2021-03-18 NOTE — TOC Transition Note (Signed)
Transition of Care [facility]) - CM/SW Discharge Note   Patient Details  Name: [name] MRN: [id number] Date of Birth: 08/08/1950  [facility] CM/SW Contact:  Lennart Pall, LCSW Phone Number: 03/18/2021, 10:15 AM   Clinical Narrative:    Met briefly with pt and confirmed she has all needed DME at home.  Plan for OPPT at [facility].  No TOC needs.   Final next level of care: OP Rehab Barriers to Discharge: No Barriers Identified   Patient Goals and CMS Choice Patient states their goals for this hospitalization and ongoing recovery are:: return home      Discharge Placement                       Discharge Plan and Services                DME Arranged: N/A DME Agency: NA                  Social Determinants of Health (SDOH) Interventions     Readmission Risk Interventions No flowsheet data found.

## 2021-03-18 NOTE — Progress Notes (Signed)
    Subjective:  Patient reports pain as moderate.  Denies N/V/CP/SOB. Patient had muscle cramps overnight  Objective:   VITALS:   Vitals:   03/17/21 1734 03/17/21 2041 03/18/21 0011 03/18/21 0409  BP: 134/72 127/65 (!) 149/77 133/63  Pulse: 69 70 78 70  Resp: '17 18 16 16  '$ Temp: 97.7 F (36.5 C) 98.1 F (36.7 C) 98.4 F (36.9 C) 97.9 F (36.6 C)  TempSrc:  Oral Oral Oral  SpO2: 100% 100% 100% 98%  Weight:      Height:        NAD ABD soft Neurovascular intact Sensation intact distally Intact pulses distally Dorsiflexion/Plantar flexion intact Incision: dressing C/D/I   Lab Results  Component Value Date   WBC 7.9 03/06/2021   HGB 12.0 03/06/2021   HCT 38.4 03/06/2021   MCV 90.6 03/06/2021   PLT 289 03/06/2021   BMET    Component Value Date/Time   NA 141 03/06/2021 1451   K 4.2 03/06/2021 1451   CL 107 03/06/2021 1451   CO2 26 03/06/2021 1451   GLUCOSE 100 (H) 03/06/2021 1451   BUN 14 03/06/2021 1451   CREATININE 0.80 03/06/2021 1451   CALCIUM 9.8 03/06/2021 1451   GFRNONAA >60 03/06/2021 1451   GFRAA 86 (L) 09/04/2014 0541     Assessment/Plan: 1 Day Post-Op   Active Problems:   H/O total knee replacement, left   WBAT with walker DVT ppx:  Apixaban , SCDs, TEDS PO pain control PT/OT Dispo: D/C planned for Sunday     Dorothyann Peng 03/18/2021, 10:20 AM [facility] is now [address], [place], [place] [address] Phone: [phone] [email or web address]  [employer]

## 2021-03-18 NOTE — Progress Notes (Signed)
Physical Therapy Treatment Patient Details Name: [name] MRN: [id number] DOB: [birth year] Today's Date: 03/18/2021    History of Present Illness Pt s/p L TKR and with hx of R TKR,  nephrectomy, CKD, L reverse TSR    PT Comments    Pt very cooperative and performed therex with assist before ambulating into hall but distance ltd by c/o fatigue/dizziness - BP 105/57 - Rn assessing pt.  Follow Up Recommendations  Follow surgeon's recommendation for DC plan and follow-up therapies     Equipment Recommendations  None recommended by PT    Recommendations for Other Services       Precautions / Restrictions Precautions Precautions: Knee;Fall Required Braces or Orthoses: Knee Immobilizer - Left Knee Immobilizer - Right: Discontinue once straight leg raise with < 10 degree lag Restrictions Weight Bearing Restrictions: No LLE Weight Bearing: Weight bearing as tolerated    Mobility  Bed Mobility Overal bed mobility: Needs Assistance Bed Mobility: Supine to Sit     Supine to sit: Min assist     General bed mobility comments: cues for sequence and use of R LE to self assist    Transfers Overall transfer level: Needs assistance Equipment used: Rolling walker (2 wheeled) Transfers: Sit to/from Stand Sit to Stand: Min assist         General transfer comment: cues for LE management and use of UEs to self assist  Ambulation/Gait Ambulation/Gait assistance: Min assist Gait Distance (Feet): 22 Feet Assistive device: Rolling walker (2 wheeled) Gait Pattern/deviations: Step-to pattern;Decreased step length - right;Decreased step length - left;Shuffle;Trunk flexed Gait velocity: decr   General Gait Details: cues for sequence, posture and position from [employer]             Wheelchair Mobility    Modified Rankin (Stroke Patients Only)       Balance Overall balance assessment: Needs assistance Sitting-balance support: No upper extremity supported;Feet  supported Sitting balance-Leahy Scale: Good     Standing balance support: Bilateral upper extremity supported Standing balance-Leahy Scale: Poor                              Cognition Arousal/Alertness: Awake/alert Behavior During Therapy: WFL for tasks assessed/performed Overall Cognitive Status: Within Functional Limits for tasks assessed                                        Exercises Total Joint Exercises Ankle Circles/Pumps: AROM;Both;15 reps;Supine Quad Sets: AROM;Both;10 reps;Supine Heel Slides: AAROM;Left;15 reps;Supine Straight Leg Raises: AAROM;Left;10 reps;Supine    General Comments        Pertinent Vitals/Pain Pain Assessment: 0-10 Pain Score: 4  Pain Location: L knee Pain Descriptors / Indicators: Aching;Sore Pain Intervention(s): Limited activity within patient's tolerance;Monitored during session;Premedicated before session;Ice applied    Home Living                      Prior Function            PT Goals (current goals can now be found in the care plan section) Acute Rehab PT Goals Patient Stated Goal: Regain IND PT Goal Formulation: With patient Time For Goal Achievement: 03/24/21 Potential to Achieve Goals: Good Progress towards PT goals: Progressing toward goals    Frequency    7X/week      PT Plan Current  plan remains appropriate    Co-evaluation              AM-PAC PT "6 Clicks" Mobility   Outcome Measure  Help needed turning from your back to your side while in a flat bed without using bedrails?: A Little Help needed moving from lying on your back to sitting on the side of a flat bed without using bedrails?: A Little Help needed moving to and from a bed to a chair (including a wheelchair)?: A Little Help needed standing up from a chair using your arms (e.g., wheelchair or bedside chair)?: A Little Help needed to walk in hospital room?: A Little Help needed climbing 3-5 steps with a  railing? : A Lot 6 Click Score: 17    End of Session Equipment Utilized During Treatment: Gait belt;Left knee immobilizer Activity Tolerance: Patient tolerated treatment well;Patient limited by fatigue Patient left: in chair;with call bell/phone within reach;with chair alarm set Nurse Communication: Mobility status PT Visit Diagnosis: Difficulty in walking, not elsewhere classified (R26.2)     Time: [id number] PT Time Calculation (min) (ACUTE ONLY): 33 min  Charges:  $Gait Training: 8-22 mins $Therapeutic Exercise: 8-22 mins                     [place] Pager [phone] Office [phone]    Jasneet Schobert 03/18/2021, 12:59 PM

## 2021-03-18 NOTE — Plan of Care (Signed)
°  Problem: Education: °Goal: Knowledge of the prescribed therapeutic regimen will improve °Outcome: Progressing °  °Problem: Clinical Measurements: °Goal: Postoperative complications will be avoided or minimized °Outcome: Progressing °  °Problem: Pain Management: °Goal: Pain level will decrease with appropriate interventions °Outcome: Progressing °  °

## 2021-03-19 DIAGNOSIS — J45909 Unspecified asthma, uncomplicated: Secondary | ICD-10-CM | POA: Diagnosis present

## 2021-03-19 DIAGNOSIS — M1712 Unilateral primary osteoarthritis, left knee: Secondary | ICD-10-CM | POA: Diagnosis present

## 2021-03-19 DIAGNOSIS — Z96612 Presence of left artificial shoulder joint: Secondary | ICD-10-CM | POA: Diagnosis present

## 2021-03-19 DIAGNOSIS — Z7901 Long term (current) use of anticoagulants: Secondary | ICD-10-CM | POA: Diagnosis not present

## 2021-03-19 DIAGNOSIS — K219 Gastro-esophageal reflux disease without esophagitis: Secondary | ICD-10-CM | POA: Diagnosis present

## 2021-03-19 DIAGNOSIS — Z79899 Other long term (current) drug therapy: Secondary | ICD-10-CM | POA: Diagnosis not present

## 2021-03-19 DIAGNOSIS — Z96651 Presence of right artificial knee joint: Secondary | ICD-10-CM | POA: Diagnosis present

## 2021-03-19 DIAGNOSIS — Z905 Acquired absence of kidney: Secondary | ICD-10-CM | POA: Diagnosis not present

## 2021-03-19 MED ORDER — TAMSULOSIN HCL 0.4 MG PO CAPS
0.4000 mg | ORAL_CAPSULE | Freq: Once | ORAL | Status: AC
  Administered 2021-03-19: 0.4 mg via ORAL
  Filled 2021-03-19: qty 1

## 2021-03-19 NOTE — Progress Notes (Signed)
Pt was due to void during shift change. Bladder scan done every 4 hours as ordered. Pt had 178 ml and 174 ml respectively at 21:20 and 0009. Pt was assisted to [facility] twice. Pt was unable to void. IV fluid at 50 ml/hr started at 0057 am. 613 ml urine scanned at 4:30. Pt was again assisted to [facility] and was only able to void 60 ml of urine. In and out cath done and 550 ml urine was drained.

## 2021-03-19 NOTE — Progress Notes (Signed)
Subjective: 2 Days Post-Op Procedure(s) (LRB): TOTAL KNEE ARTHROPLASTY (Left) Patient seen in rounds for Dr. Veverly Fells Patient reports pain as mild.   Patient having difficulty voiding at this time She was able to void just slightly this morning, bladder scan this morning  Objective: Vital signs in last 24 hours: Temp:  [97.6 F (36.4 C)-99.5 F (37.5 C)] 99.5 F (37.5 C) (08/14 0537) Pulse Rate:  [53-89] 80 (08/14 0537) Resp:  [16-18] 16 (08/14 0537) BP: (94-152)/(57-75) 152/69 (08/14 0537) SpO2:  [98 %-100 %] 98 % (08/14 0537)  Intake/Output from previous day: 08/13 0701 - 08/14 0700 In: 600 [P.O.:600] Out: 550 [Urine:550] Intake/Output this shift: No intake/output data recorded.  No results for input(s): HGB in the last 72 hours. No results for input(s): WBC, RBC, HCT, PLT in the last 72 hours. No results for input(s): NA, K, CL, CO2, BUN, CREATININE, GLUCOSE, CALCIUM in the last 72 hours. No results for input(s): LABPT, INR in the last 72 hours.  Neurologically intact Neurovascular intact Sensation intact distally Intact pulses distally Dorsiflexion/Plantar flexion intact Incision: dressing C/D/I No cellulitis present Compartment soft   Assessment/Plan: 2 Days Post-Op Procedure(s) (LRB): TOTAL KNEE ARTHROPLASTY (Left) Advance diet Up with therapy WBAT DVT ppx: Apixaban, SCDs, TEDS Was cleared by PT this am If patient is able to urinate today she is okay to be d/c home today   Derrick Ravel [phone] 03/19/2021, 10:29 AM

## 2021-03-19 NOTE — Progress Notes (Signed)
MD notified that patient urinated 245m but had a bladder scan volume of 7332m MD said to keep the patient overnight to see if earlier dose of Flomax will help.

## 2021-03-19 NOTE — Progress Notes (Signed)
Physical Therapy Treatment Patient Details Name: [name] MRN: [id number] DOB: 1950-11-18 Today's Date: 03/19/2021    History of Present Illness Pt s/p L TKR and with hx of R TKR,  nephrectomy, CKD, L reverse TSR    PT Comments    Pt very cooperative and mobilizing well with no c/o dizziness.  Pt completed HEP with written instruction provided, ambulated increased distance in hall, reviewed don/doff KI and negotiated stairs.  Pt hopeful for dc home later today.  Follow Up Recommendations  Follow surgeon's recommendation for DC plan and follow-up therapies     Equipment Recommendations  None recommended by PT    Recommendations for Other Services       Precautions / Restrictions Precautions Precautions: Knee;Fall Required Braces or Orthoses: Knee Immobilizer - Left Knee Immobilizer - Right: Discontinue once straight leg raise with < 10 degree lag Restrictions Weight Bearing Restrictions: No LLE Weight Bearing: Weight bearing as tolerated    Mobility  Bed Mobility Overal bed mobility: Needs Assistance Bed Mobility: Supine to Sit     Supine to sit: Min guard     General bed mobility comments: cues for sequence and use of R LE to self assist    Transfers Overall transfer level: Needs assistance Equipment used: Rolling walker (2 wheeled) Transfers: Sit to/from Stand Sit to Stand: Min guard;Supervision         General transfer comment: min cues for LE management and use of UEs to self assist  Ambulation/Gait Ambulation/Gait assistance: Min guard;Supervision Gait Distance (Feet): 100 Feet Assistive device: Rolling walker (2 wheeled) Gait Pattern/deviations: Step-to pattern;Decreased step length - right;Decreased step length - left;Shuffle;Trunk flexed Gait velocity: decr   General Gait Details: cues for sequence, posture and position from RW; no c/o dizziness   Stairs Stairs: Yes Stairs assistance: Min guard Stair Management: No rails;One rail  Left;Step to pattern;Forwards;Backwards;With walker;With crutches Number of Stairs: 4 General stair comments: single step twice bkwd with RW; two steps fwd with rail and crutch   Wheelchair Mobility    Modified Rankin (Stroke Patients Only)       Balance Overall balance assessment: Needs assistance Sitting-balance support: No upper extremity supported;Feet supported Sitting balance-Leahy Scale: Good     Standing balance support: No upper extremity supported Standing balance-Leahy Scale: Fair                              Cognition Arousal/Alertness: Awake/alert Behavior During Therapy: WFL for tasks assessed/performed Overall Cognitive Status: Within Functional Limits for tasks assessed                                        Exercises Total Joint Exercises Ankle Circles/Pumps: AROM;Both;15 reps;Supine Quad Sets: AROM;Both;10 reps;Supine Heel Slides: AAROM;Left;Supine;20 reps Hip ABduction/ADduction: AAROM;Left;10 reps;Supine Straight Leg Raises: AAROM;Left;Supine;20 reps    General Comments        Pertinent Vitals/Pain Pain Assessment: 0-10 Pain Score: 5  Pain Location: L knee Pain Descriptors / Indicators: Aching;Sore Pain Intervention(s): Limited activity within patient's tolerance;Monitored during session;Premedicated before session;Ice applied    Home Living                      Prior Function            PT Goals (current goals can now be found in the care plan section) Acute  Rehab PT Goals Patient Stated Goal: Regain IND PT Goal Formulation: With patient Time For Goal Achievement: 03/24/21 Potential to Achieve Goals: Good Progress towards PT goals: Progressing toward goals    Frequency    7X/week      PT Plan Current plan remains appropriate    Co-evaluation              AM-PAC PT "6 Clicks" Mobility   Outcome Measure  Help needed turning from your back to your side while in a flat bed without  using bedrails?: A Little Help needed moving from lying on your back to sitting on the side of a flat bed without using bedrails?: A Little Help needed moving to and from a bed to a chair (including a wheelchair)?: A Little Help needed standing up from a chair using your arms (e.g., wheelchair or bedside chair)?: A Little Help needed to walk in hospital room?: A Little Help needed climbing 3-5 steps with a railing? : A Lot 6 Click Score: 17    End of Session Equipment Utilized During Treatment: Gait belt;Left knee immobilizer Activity Tolerance: Patient tolerated treatment well Patient left: in chair;with call bell/phone within reach;with chair alarm set Nurse Communication: Mobility status PT Visit Diagnosis: Difficulty in walking, not elsewhere classified (R26.2)     Time: [id number] PT Time Calculation (min) (ACUTE ONLY): 48 min  Charges:  $Gait Training: 8-22 mins $Therapeutic Exercise: 8-22 mins $Therapeutic Activity: 8-22 mins                     Debe Coder PT Acute Rehabilitation Services Pager [phone] Office [phone]    Pearson Picou 03/19/2021, 1:05 PM

## 2021-03-19 NOTE — Progress Notes (Signed)
RN notified MD that surgical dressing is still on the patient. MD informed RN that surgical dressing can be removed and aquacell can be put on.

## 2021-03-19 NOTE — Progress Notes (Signed)
RN notified MD that patient voided 226m but had 7164mvolume during bladder scan. MD said if patient is able to void again, patient is able to D/C.

## 2021-03-19 NOTE — Progress Notes (Signed)
RN called Elizabeth Sauer PA about pt not being able to urinate. Pt required an 800cc in and out cath this am. She only urinated 50 cc otherwise.Flomax ordered.

## 2021-03-19 NOTE — Progress Notes (Signed)
Orthopedic Tech Progress Note Patient Details:  [name] [birth year] [id number]  Patient ID: [name], female   DOB: 1951/03/21, 70 y.o.   MRN: [id number]  [name] 03/19/2021, 11:34 AMpickup cpm and OHF.

## 2021-03-19 NOTE — Progress Notes (Signed)
RN notified by Charge RN that patient cannot leave until she urinates on her own. Patient has had to be In-and-out catheterized since pulling foley catheter. Flomax was added. Patient to D/C after she is able to urinate on her own.

## 2021-03-20 ENCOUNTER — Encounter (HOSPITAL_COMMUNITY): Payer: Self-pay | Admitting: Orthopedic Surgery

## 2021-03-20 NOTE — Plan of Care (Signed)
  Problem: Activity: Goal: Ability to avoid complications of mobility impairment will improve Outcome: Progressing   Problem: Clinical Measurements: Goal: Postoperative complications will be avoided or minimized Outcome: Progressing   Problem: Pain Management: Goal: Pain level will decrease with appropriate interventions Outcome: Progressing   Problem: Skin Integrity: Goal: Will show signs of wound healing Outcome: Progressing   Problem: Education: Goal: Knowledge of the prescribed therapeutic regimen will improve Outcome: Progressing   Problem: Activity: Goal: Ability to avoid complications of mobility impairment will improve Outcome: Progressing   Problem: Clinical Measurements: Goal: Postoperative complications will be avoided or minimized Outcome: Progressing   Problem: Pain Management: Goal: Pain level will decrease with appropriate interventions Outcome: Progressing   Problem: Skin Integrity: Goal: Will show signs of wound healing Outcome: Progressing

## 2021-03-20 NOTE — Progress Notes (Signed)
Provided discharge education/instructions, all questions and concerns addressed, Pt not in acute distress.  Pt had been having issues with urinary retention but Pt stated she is comfortable to go home, she had the same issue during the previous knee surgery which had resolved within 24hrs when she got home. Pt educated on monitoring her intake and output and signs of retention. Brad Dixon-PA updated on Pt's urinary status, he will be in contact with her in the next few days.  Pt discharged home with belongings accompanied by husband.

## 2021-03-20 NOTE — Progress Notes (Signed)
Updated Merla Riches PA-C on patient's urinary status. Notified PA that patient has only voided 34m urine on her own this am. Most recently, she voided about 127mand the post void residual bladder scan showed 35041mn bladder. Patient expressed desire to go home.  Brad Dixon responded " I am fine with her going home and I can stay in contact with her over the next few days."  Plan to d/c patient home when ride arrives.

## 2021-03-20 NOTE — Progress Notes (Signed)
Physical Therapy Treatment Patient Details Name: [name] MRN: [id number] DOB: 12-14-1950 Today's Date: 03/20/2021    History of Present Illness Pt s/p L TKR and with hx of R TKR,  nephrectomy, CKD, L reverse TSR    PT Comments    Pt tolerates ambulation, using step to pattern with decreased L weight-shift/stance time. Improving with heel-toe pattern and good steadiness without LOB while ambulating. Pt able to perform ankle pumps and quad set without difficulty. Pt declines stair training this session. Pt hoping to d/c home this afternoon. Pt with good family support and is motivated to return home today.   Follow Up Recommendations  Follow surgeon's recommendation for DC plan and follow-up therapies     Equipment Recommendations  None recommended by PT    Recommendations for Other Services       Precautions / Restrictions Precautions Precautions: Knee;Fall Required Braces or Orthoses: Knee Immobilizer - Left Knee Immobilizer - Right: Discontinue once straight leg raise with < 10 degree lag Restrictions Weight Bearing Restrictions: No LLE Weight Bearing: Weight bearing as tolerated    Mobility  Bed Mobility Overal bed mobility: Needs Assistance Bed Mobility: Supine to Sit;Sit to Supine  Supine to sit: Min assist Sit to supine: Min assist   General bed mobility comments: min A for controlled RLE descent for pt comfort and to assist back into bed to avoid dangling EOB, pt states her spouse will be assisting with this at home    Transfers Overall transfer level: Needs assistance Equipment used: Rolling walker (2 wheeled) Transfers: Sit to/from Stand Sit to Stand: Supervision  General transfer comment: BUE assisting to power to stand, good steadiness upon rising  Ambulation/Gait Ambulation/Gait assistance: Supervision Gait Distance (Feet): 110 Feet Assistive device: Rolling walker (2 wheeled) Gait Pattern/deviations: Step-to pattern;Decreased step length -  right;Decreased step length - left;Decreased stance time - left Gait velocity: decreased   General Gait Details: step to pattern with decreased weight-shift/stance on LLE, able to continue conversation while mobilizing, denies dizziness/lightheadedness, reports fatigue limiting distance, no LOB   Stairs Stairs:  (pt declines)           Wheelchair Mobility    Modified Rankin (Stroke Patients Only)       Balance Overall balance assessment: Needs assistance Sitting-balance support: No upper extremity supported;Feet supported Sitting balance-Leahy Scale: Good Sitting balance - Comments: seated EOB   Standing balance support: No upper extremity supported Standing balance-Leahy Scale: Fair Standing balance comment: static without UE support to don/dof face mask         Cognition Arousal/Alertness: Awake/alert Behavior During Therapy: WFL for tasks assessed/performed Overall Cognitive Status: Within Functional Limits for tasks assessed    Exercises Total Joint Exercises Ankle Circles/Pumps: Supine;AROM;Both;10 reps Quad Sets: Supine;AROM;Strengthening;Both;10 reps    General Comments        Pertinent Vitals/Pain Pain Assessment: 0-10 Pain Score: 4  Pain Location: L knee Pain Descriptors / Indicators: Aching;Sore Pain Intervention(s): Limited activity within patient's tolerance;Monitored during session;Premedicated before session    Home Living                      Prior Function            PT Goals (current goals can now be found in the care plan section) Acute Rehab PT Goals Patient Stated Goal: Regain IND PT Goal Formulation: With patient Time For Goal Achievement: 03/24/21 Potential to Achieve Goals: Good Progress towards PT goals: Progressing toward goals  Frequency    7X/week      PT Plan Current plan remains appropriate    Co-evaluation              AM-PAC PT "6 Clicks" Mobility   Outcome Measure  Help needed turning  from your back to your side while in a flat bed without using bedrails?: A Little Help needed moving from lying on your back to sitting on the side of a flat bed without using bedrails?: A Little Help needed moving to and from a bed to a chair (including a wheelchair)?: A Little Help needed standing up from a chair using your arms (e.g., wheelchair or bedside chair)?: A Little Help needed to walk in hospital room?: A Little Help needed climbing 3-5 steps with a railing? : A Lot 6 Click Score: 17    End of Session Equipment Utilized During Treatment: Gait belt;Left knee immobilizer Activity Tolerance: Patient tolerated treatment well Patient left: in bed;with call bell/phone within reach Nurse Communication: Mobility status PT Visit Diagnosis: Difficulty in walking, not elsewhere classified (R26.2)     Time: [id number] PT Time Calculation (min) (ACUTE ONLY): 12 min  Charges:  $Gait Training: 8-22 mins                      Tori Kyros Salzwedel PT, DPT 03/20/21, 11:00 AM

## 2021-03-20 NOTE — Progress Notes (Signed)
Pt voided 200 ml. Bladder scan done after that, showed  671 ml. In and out cath done, 660m urine drained.

## 2021-03-20 NOTE — Progress Notes (Signed)
   Subjective: 3 Days Post-Op Procedure(s) (LRB): TOTAL KNEE ARTHROPLASTY (Left)  Pt feeling better No bladder pain and urination seems to have improved Denies any symptoms or issues  Patient reports pain as moderate.  Objective:   VITALS:   Vitals:   03/19/21 2126 03/20/21 0549  BP: 138/60 (!) 120/99  Pulse: 86 88  Resp: 18 18  Temp: 98.5 F (36.9 C) 98.7 F (37.1 C)  SpO2: 100% 98%    Left knee incision healing well Nv intact distally No rashes or edema distally Good ambulation with rolling walker  LABS No results for input(s): HGB, HCT, WBC, PLT in the last 72 hours.  No results for input(s): NA, K, BUN, CREATININE, GLUCOSE in the last 72 hours.   Assessment/Plan: 3 Days Post-Op Procedure(s) (LRB): TOTAL KNEE ARTHROPLASTY (Left) D/c home today F/u in 2 weeks Pain management Will monitor any urinary issues   Kathrynn Speed, MPAS [facility] is now [employer] [address] [place], [place], [place] [address] Phone: [phone] [email or web address] Facebook  [employer]

## 2021-03-20 NOTE — Discharge Summary (Signed)
In most cases prophylactic antibiotics for Dental procdeures after total joint surgery are not necessary.  Exceptions are as follows:  1. History of prior total joint infection  2. Severely immunocompromised (Organ Transplant, cancer chemotherapy, Rheumatoid biologic meds such as [place])  3. Poorly controlled diabetes (A1C &gt; 8.0, blood glucose over 200)  If you have one of these conditions, contact your surgeon for an antibiotic prescription, prior to your dental procedure. Orthopedic Discharge Summary        Physician Discharge Summary  Patient ID: [name] MRN: [id number] DOB/AGE: Aug 14, 1950 70 y.o.  Admit date: 03/17/2021 Discharge date: 03/20/2021   Procedures:  Procedure(s) (LRB): TOTAL KNEE ARTHROPLASTY (Left)  Attending Physician:  Dr. Esmond Plants  Admission Diagnoses:  left knee end stage osteoarthritis  Discharge Diagnoses:  left knee end stage osteoarthritis   Past Medical History:  Diagnosis Date   Arthritis    degenerative, shoulders, both    Asthma    seasonal - randomly used inhaler , "never consistently"     Chronic kidney disease    one kidney- Left /w blockage, removed, now with one kidney    Complication of anesthesia    GERD (gastroesophageal reflux disease)    Heart murmur    Pneumonia    PONV (postoperative nausea and vomiting)    Tachycardia    back in 1970's, rec'd care for increased heartrate here in [place], was on a betablocker     PCP: Josetta Huddle, MD   Discharged Condition: good  Hospital Course:  Patient underwent the above stated procedure on 03/17/2021. Patient tolerated the procedure well and brought to the recovery room in good condition and subsequently to the floor. Mild problems with complete urination but otherwise patient had an uncomplicated hospital course and was stable for discharge.   Disposition: Discharge disposition: 01-Home or Self Care      with follow up in 2 weeks    Follow-up Information      Netta Cedars, MD. Go on 03/28/2021.   Specialty: Orthopedic Surgery Why: You are scheduled for first post op appointment on Tuesday August 23rd at 1:30pm. Contact information: [address] [place] [address] [phone]         Rosilyn Mings.. Go on 03/21/2021.   Why: You are scheduled for physical therapy eval on Tuesday August 16th at 10:15am. Contact information: [address] [place] [address] [phone]                 Dental Antibiotics:  In most cases prophylactic antibiotics for Dental procdeures after total joint surgery are not necessary.  Exceptions are as follows:  1. History of prior total joint infection  2. Severely immunocompromised (Organ Transplant, cancer chemotherapy, Rheumatoid biologic meds such as [place])  3. Poorly controlled diabetes (A1C &gt; 8.0, blood glucose over 200)  If you have one of these conditions, contact your surgeon for an antibiotic prescription, prior to your dental procedure.  Discharge Instructions     Call MD / Call 911   Complete by: As directed    If you experience chest pain or shortness of breath, CALL 911 and be transported to the hospital emergency room.  If you develope a fever above 101 F, pus (white drainage) or increased drainage or redness at the wound, or calf pain, call your surgeon's office.   Call MD / Call 911   Complete by: As directed    If you experience chest pain or shortness  of breath, CALL 911 and be transported to the hospital emergency room.  If you develope a fever above 101 F, pus (white drainage) or increased drainage or redness at the wound, or calf pain, call your surgeon's office.   Constipation Prevention   Complete by: As directed    Drink plenty of fluids.  Prune juice may be helpful.  You may use a stool softener, such as Colace (over the counter) 100 mg twice a day.  Use MiraLax (over the counter) for constipation as needed.    Constipation Prevention   Complete by: As directed    Drink plenty of fluids.  Prune juice may be helpful.  You may use a stool softener, such as Colace (over the counter) 100 mg twice a day.  Use MiraLax (over the counter) for constipation as needed.   Diet - low sodium heart healthy   Complete by: As directed    Diet - low sodium heart healthy   Complete by: As directed    Do not put a pillow under the knee. Place it under the heel.   Complete by: As directed    Driving restrictions   Complete by: As directed    No driving for two weeks   Increase activity slowly as tolerated   Complete by: As directed    Post-operative opioid taper instructions:   Complete by: As directed    POST-OPERATIVE OPIOID TAPER INSTRUCTIONS: It is important to wean off of your opioid medication as soon as possible. If you do not need pain medication after your surgery it is ok to stop day one. Opioids include: Codeine, Hydrocodone(Norco, Vicodin), Oxycodone(Percocet, oxycontin) and hydromorphone amongst others.  Long term and even short term use of opiods can cause: Increased pain response Dependence Constipation Depression Respiratory depression And more.  Withdrawal symptoms can include Flu like symptoms Nausea, vomiting And more Techniques to manage these symptoms Hydrate well Eat regular healthy meals Stay active Use relaxation techniques(deep breathing, meditating, yoga) Do Not substitute Alcohol to help with tapering If you have been on opioids for less than two weeks and do not have pain than it is ok to stop all together.  Plan to wean off of opioids This plan should start within one week post op of your joint replacement. Maintain the same interval or time between taking each dose and first decrease the dose.  Cut the total daily intake of opioids by one tablet each day Next start to increase the time between doses. The last dose that should be eliminated is the evening dose.       Post-operative opioid taper instructions:   Complete by: As directed    POST-OPERATIVE OPIOID TAPER INSTRUCTIONS: It is important to wean off of your opioid medication as soon as possible. If you do not need pain medication after your surgery it is ok to stop day one. Opioids include: Codeine, Hydrocodone(Norco, Vicodin), Oxycodone(Percocet, oxycontin) and hydromorphone amongst others.  Long term and even short term use of opiods can cause: Increased pain response Dependence Constipation Depression Respiratory depression And more.  Withdrawal symptoms can include Flu like symptoms Nausea, vomiting And more Techniques to manage these symptoms Hydrate well Eat regular healthy meals Stay active Use relaxation techniques(deep breathing, meditating, yoga) Do Not substitute Alcohol to help with tapering If you have been on opioids for less than two weeks and do not have pain than it is ok to stop all together.  Plan to wean off of opioids This plan should start  within one week post op of your joint replacement. Maintain the same interval or time between taking each dose and first decrease the dose.  Cut the total daily intake of opioids by one tablet each day Next start to increase the time between doses. The last dose that should be eliminated is the evening dose.      TED hose   Complete by: As directed    Use stockings (TED hose) for three weeks on both leg(s).  You may remove them at night for sleeping.   Weight bearing as tolerated   Complete by: As directed        Allergies as of 03/20/2021   No Known Allergies      Medication List     STOP taking these medications    albuterol 108 (90 Base) MCG/ACT inhaler Commonly known as: VENTOLIN HFA   calcium carbonate 500 MG chewable tablet Commonly known as: TUMS - dosed in mg elemental calcium   famotidine 20 MG tablet Commonly known as: PEPCID   gabapentin 300 MG capsule Commonly known as: NEURONTIN    MULTIVITAMIN PO   traMADol 50 MG tablet Commonly known as: ULTRAM   Vitamin D (Cholecalciferol) 25 MCG (1000 UT) Tabs       TAKE these medications    apixaban 2.5 MG Tabs tablet Commonly known as: Eliquis Take 1 tablet (2.5 mg total) by mouth 2 (two) times daily.   methocarbamol 500 MG tablet Commonly known as: Robaxin Take 1 tablet (500 mg total) by mouth every 6 (six) hours as needed.   ondansetron 4 MG tablet Commonly known as: Zofran Take 1 tablet (4 mg total) by mouth every 8 (eight) hours as needed for nausea, vomiting or refractory nausea / vomiting.   oxyCODONE-acetaminophen 5-325 MG tablet Commonly known as: Percocet Take 1-2 tablets by mouth every 4 (four) hours as needed for severe pain. What changed: reasons to take this               Discharge Care Instructions  (From admission, onward)           Start     Ordered   03/19/21 0000  Weight bearing as tolerated        03/19/21 1032              Signed: Ventura Bruns 03/20/2021, 10:12 AM  [facility] is now [employer] [address], [place], [place], [place] [address] Phone: [place]

## 2021-03-21 DIAGNOSIS — M25562 Pain in left knee: Secondary | ICD-10-CM | POA: Diagnosis not present

## 2021-03-21 DIAGNOSIS — M25662 Stiffness of left knee, not elsewhere classified: Secondary | ICD-10-CM | POA: Diagnosis not present

## 2021-03-23 DIAGNOSIS — M25662 Stiffness of left knee, not elsewhere classified: Secondary | ICD-10-CM | POA: Diagnosis not present

## 2021-03-23 DIAGNOSIS — M25562 Pain in left knee: Secondary | ICD-10-CM | POA: Diagnosis not present

## 2021-03-27 DIAGNOSIS — M25662 Stiffness of left knee, not elsewhere classified: Secondary | ICD-10-CM | POA: Diagnosis not present

## 2021-03-27 DIAGNOSIS — M25562 Pain in left knee: Secondary | ICD-10-CM | POA: Diagnosis not present

## 2021-03-28 ENCOUNTER — Encounter: Payer: Self-pay | Admitting: Internal Medicine

## 2021-03-28 DIAGNOSIS — Z4789 Encounter for other orthopedic aftercare: Secondary | ICD-10-CM | POA: Diagnosis not present

## 2021-03-29 DIAGNOSIS — M25562 Pain in left knee: Secondary | ICD-10-CM | POA: Diagnosis not present

## 2021-03-29 DIAGNOSIS — M25662 Stiffness of left knee, not elsewhere classified: Secondary | ICD-10-CM | POA: Diagnosis not present

## 2021-03-31 DIAGNOSIS — M25662 Stiffness of left knee, not elsewhere classified: Secondary | ICD-10-CM | POA: Diagnosis not present

## 2021-03-31 DIAGNOSIS — M25562 Pain in left knee: Secondary | ICD-10-CM | POA: Diagnosis not present

## 2021-04-03 DIAGNOSIS — M25562 Pain in left knee: Secondary | ICD-10-CM | POA: Diagnosis not present

## 2021-04-03 DIAGNOSIS — M25662 Stiffness of left knee, not elsewhere classified: Secondary | ICD-10-CM | POA: Diagnosis not present

## 2021-04-05 DIAGNOSIS — M25662 Stiffness of left knee, not elsewhere classified: Secondary | ICD-10-CM | POA: Diagnosis not present

## 2021-04-05 DIAGNOSIS — M25562 Pain in left knee: Secondary | ICD-10-CM | POA: Diagnosis not present

## 2021-04-07 DIAGNOSIS — M25562 Pain in left knee: Secondary | ICD-10-CM | POA: Diagnosis not present

## 2021-04-07 DIAGNOSIS — M25662 Stiffness of left knee, not elsewhere classified: Secondary | ICD-10-CM | POA: Diagnosis not present

## 2021-04-12 DIAGNOSIS — M25662 Stiffness of left knee, not elsewhere classified: Secondary | ICD-10-CM | POA: Diagnosis not present

## 2021-04-12 DIAGNOSIS — M25562 Pain in left knee: Secondary | ICD-10-CM | POA: Diagnosis not present

## 2021-04-14 DIAGNOSIS — M25562 Pain in left knee: Secondary | ICD-10-CM | POA: Diagnosis not present

## 2021-04-14 DIAGNOSIS — M25662 Stiffness of left knee, not elsewhere classified: Secondary | ICD-10-CM | POA: Diagnosis not present

## 2021-04-18 DIAGNOSIS — M25662 Stiffness of left knee, not elsewhere classified: Secondary | ICD-10-CM | POA: Diagnosis not present

## 2021-04-18 DIAGNOSIS — M25562 Pain in left knee: Secondary | ICD-10-CM | POA: Diagnosis not present

## 2021-04-21 DIAGNOSIS — M25662 Stiffness of left knee, not elsewhere classified: Secondary | ICD-10-CM | POA: Diagnosis not present

## 2021-04-21 DIAGNOSIS — M25562 Pain in left knee: Secondary | ICD-10-CM | POA: Diagnosis not present

## 2021-04-25 DIAGNOSIS — M25562 Pain in left knee: Secondary | ICD-10-CM | POA: Diagnosis not present

## 2021-04-25 DIAGNOSIS — M25662 Stiffness of left knee, not elsewhere classified: Secondary | ICD-10-CM | POA: Diagnosis not present

## 2021-04-27 DIAGNOSIS — M25662 Stiffness of left knee, not elsewhere classified: Secondary | ICD-10-CM | POA: Diagnosis not present

## 2021-04-27 DIAGNOSIS — M25562 Pain in left knee: Secondary | ICD-10-CM | POA: Diagnosis not present

## 2021-05-02 DIAGNOSIS — M25662 Stiffness of left knee, not elsewhere classified: Secondary | ICD-10-CM | POA: Diagnosis not present

## 2021-05-02 DIAGNOSIS — M25562 Pain in left knee: Secondary | ICD-10-CM | POA: Diagnosis not present

## 2021-05-04 DIAGNOSIS — M25562 Pain in left knee: Secondary | ICD-10-CM | POA: Diagnosis not present

## 2021-05-04 DIAGNOSIS — M25662 Stiffness of left knee, not elsewhere classified: Secondary | ICD-10-CM | POA: Diagnosis not present

## 2021-05-05 DIAGNOSIS — N3000 Acute cystitis without hematuria: Secondary | ICD-10-CM | POA: Diagnosis not present

## 2021-06-02 DIAGNOSIS — R739 Hyperglycemia, unspecified: Secondary | ICD-10-CM | POA: Diagnosis not present

## 2021-06-02 DIAGNOSIS — M2550 Pain in unspecified joint: Secondary | ICD-10-CM | POA: Diagnosis not present

## 2021-06-02 DIAGNOSIS — R630 Anorexia: Secondary | ICD-10-CM | POA: Diagnosis not present

## 2021-06-02 DIAGNOSIS — R7309 Other abnormal glucose: Secondary | ICD-10-CM | POA: Diagnosis not present

## 2021-06-22 DIAGNOSIS — H2513 Age-related nuclear cataract, bilateral: Secondary | ICD-10-CM | POA: Diagnosis not present

## 2021-06-27 DIAGNOSIS — Z4789 Encounter for other orthopedic aftercare: Secondary | ICD-10-CM | POA: Diagnosis not present

## 2021-07-11 DIAGNOSIS — Z1231 Encounter for screening mammogram for malignant neoplasm of breast: Secondary | ICD-10-CM | POA: Diagnosis not present

## 2021-07-18 DIAGNOSIS — M25511 Pain in right shoulder: Secondary | ICD-10-CM | POA: Diagnosis not present

## 2021-07-18 DIAGNOSIS — M25512 Pain in left shoulder: Secondary | ICD-10-CM | POA: Diagnosis not present

## 2021-08-14 DIAGNOSIS — E559 Vitamin D deficiency, unspecified: Secondary | ICD-10-CM | POA: Diagnosis not present

## 2021-08-14 DIAGNOSIS — M2550 Pain in unspecified joint: Secondary | ICD-10-CM | POA: Diagnosis not present

## 2021-08-14 DIAGNOSIS — K635 Polyp of colon: Secondary | ICD-10-CM | POA: Diagnosis not present

## 2021-08-14 DIAGNOSIS — J309 Allergic rhinitis, unspecified: Secondary | ICD-10-CM | POA: Diagnosis not present

## 2021-08-14 DIAGNOSIS — G2581 Restless legs syndrome: Secondary | ICD-10-CM | POA: Diagnosis not present

## 2021-08-14 DIAGNOSIS — Z0001 Encounter for general adult medical examination with abnormal findings: Secondary | ICD-10-CM | POA: Diagnosis not present

## 2021-08-14 DIAGNOSIS — R630 Anorexia: Secondary | ICD-10-CM | POA: Diagnosis not present

## 2021-08-14 DIAGNOSIS — Z1211 Encounter for screening for malignant neoplasm of colon: Secondary | ICD-10-CM | POA: Diagnosis not present

## 2021-08-14 DIAGNOSIS — G4700 Insomnia, unspecified: Secondary | ICD-10-CM | POA: Diagnosis not present

## 2021-08-14 DIAGNOSIS — R739 Hyperglycemia, unspecified: Secondary | ICD-10-CM | POA: Diagnosis not present

## 2021-08-14 DIAGNOSIS — I700 Atherosclerosis of aorta: Secondary | ICD-10-CM | POA: Diagnosis not present

## 2021-09-19 DIAGNOSIS — Z471 Aftercare following joint replacement surgery: Secondary | ICD-10-CM | POA: Diagnosis not present

## 2021-09-19 DIAGNOSIS — Z4789 Encounter for other orthopedic aftercare: Secondary | ICD-10-CM | POA: Diagnosis not present

## 2021-11-02 ENCOUNTER — Encounter: Payer: Self-pay | Admitting: Internal Medicine

## 2021-11-17 DIAGNOSIS — M5451 Vertebrogenic low back pain: Secondary | ICD-10-CM | POA: Diagnosis not present

## 2021-11-21 ENCOUNTER — Telehealth: Payer: Self-pay | Admitting: *Deleted

## 2021-11-21 DIAGNOSIS — R300 Dysuria: Secondary | ICD-10-CM | POA: Diagnosis not present

## 2021-11-21 DIAGNOSIS — R350 Frequency of micturition: Secondary | ICD-10-CM | POA: Diagnosis not present

## 2021-11-21 DIAGNOSIS — N3000 Acute cystitis without hematuria: Secondary | ICD-10-CM | POA: Diagnosis not present

## 2021-11-21 NOTE — Telephone Encounter (Signed)
Dr Carlean Purl- this pt has a recall colon. Scheduled with you on 5/25. During chart review, notation of Malignant Hyperthermia was found in anesthesia note from 8/22 Knee surgery. Called pt and she was unaware of any anesthesia issues and had never heard that term before. She plans to follow up with her ortho team for clarification.  Would you please review and let us know if you would like an OV or direct admit to [facility]?  Pt also stated that she no longer takes any blood thinners which was still noted in Epic. ? ?Thank you, ? ?Lattie Haw PV ?

## 2021-11-21 NOTE — Telephone Encounter (Signed)
I have reviewed chart and cannot see any evidence to support a diagnosis of malignant hyperthermia ? ?Propofol does not induce that so would not be an issue that I know but will ask Osvaldo Angst, CRNA for his help to sort this out as I am suspicious this may be a documentation error that needs to be corrected ?

## 2021-11-23 ENCOUNTER — Telehealth: Payer: Self-pay | Admitting: *Deleted

## 2021-11-23 NOTE — Telephone Encounter (Signed)
[name], ? ?This pt  has uneventfully had MH triggering agents with a previous operative procedure so does not have MH.  She is cleared for care at [facility]. ? ?Thanks much, ? ?Kalan Yeley ?

## 2021-11-29 DIAGNOSIS — M25511 Pain in right shoulder: Secondary | ICD-10-CM | POA: Diagnosis not present

## 2021-12-07 ENCOUNTER — Ambulatory Visit (AMBULATORY_SURGERY_CENTER): Payer: PPO | Admitting: *Deleted

## 2021-12-07 VITALS — Ht 64.0 in | Wt 154.0 lb

## 2021-12-07 DIAGNOSIS — M5416 Radiculopathy, lumbar region: Secondary | ICD-10-CM | POA: Diagnosis not present

## 2021-12-07 DIAGNOSIS — Z1211 Encounter for screening for malignant neoplasm of colon: Secondary | ICD-10-CM

## 2021-12-07 NOTE — Progress Notes (Signed)
No egg or soy allergy known to patient  ?No issues known to pt with past sedation with any surgeries or procedures ?Patient denies ever being told they had issues or difficulty with intubation  ?No FH of Malignant Hyperthermia ?Pt is not on diet pills ?Pt is not on  home 02  ?Pt is not on blood thinners  ?Pt denies issues with constipation  ?No A fib or A flutter ? ? ?PV completed over the phone. Pt verified name, DOB, address and insurance during PV today.  ? ?Pt encouraged to call with questions or issues.  ?If pt has My chart, procedure instructions sent via My Chart  ?Insurance confirmed with pt at [facility] today   ?

## 2021-12-08 NOTE — Telephone Encounter (Signed)
PV completed ?

## 2021-12-22 ENCOUNTER — Encounter: Payer: Self-pay | Admitting: Certified Registered Nurse Anesthetist

## 2021-12-25 ENCOUNTER — Encounter: Payer: Self-pay | Admitting: Internal Medicine

## 2021-12-28 ENCOUNTER — Ambulatory Visit (AMBULATORY_SURGERY_CENTER): Payer: PPO | Admitting: Internal Medicine

## 2021-12-28 ENCOUNTER — Encounter: Payer: Self-pay | Admitting: Internal Medicine

## 2021-12-28 VITALS — BP 144/70 | HR 78 | Temp 98.6°F | Resp 16 | Ht 64.0 in | Wt 154.0 lb

## 2021-12-28 DIAGNOSIS — D124 Benign neoplasm of descending colon: Secondary | ICD-10-CM | POA: Diagnosis not present

## 2021-12-28 DIAGNOSIS — J45909 Unspecified asthma, uncomplicated: Secondary | ICD-10-CM | POA: Diagnosis not present

## 2021-12-28 DIAGNOSIS — Z1211 Encounter for screening for malignant neoplasm of colon: Secondary | ICD-10-CM | POA: Diagnosis not present

## 2021-12-28 DIAGNOSIS — D122 Benign neoplasm of ascending colon: Secondary | ICD-10-CM | POA: Diagnosis not present

## 2021-12-28 DIAGNOSIS — D120 Benign neoplasm of cecum: Secondary | ICD-10-CM

## 2021-12-28 MED ORDER — SODIUM CHLORIDE 0.9 % IV SOLN: 500.0000 mL | Freq: Once | INTRAVENOUS | Status: DC

## 2021-12-28 NOTE — Progress Notes (Signed)
Report given to PACU, vss 

## 2021-12-28 NOTE — Op Note (Signed)
[place] Patient Name: [name] Procedure Date: 12/28/2021 10:32 AM MRN: [id number] Endoscopist: Gatha Mayer , MD Age: 71 Referring MD:  Date of Birth: [birth year] Gender: Female Account #: [id number] Procedure:                Colonoscopy Indications:              Screening for colorectal malignant neoplasm, Last                            colonoscopy: 2012 Medicines:                Monitored Anesthesia Care Procedure:                Pre-Anesthesia Assessment:                           - Prior to the procedure, a History and Physical                            was performed, and patient medications and                            allergies were reviewed. The patient's tolerance of                            previous anesthesia was also reviewed. The risks                            and benefits of the procedure and the sedation                            options and risks were discussed with the patient.                            All questions were answered, and informed consent                            was obtained. Prior Anticoagulants: The patient has                            taken no previous anticoagulant or antiplatelet                            agents. ASA Grade Assessment: II - A patient with                            mild systemic disease. After reviewing the risks                            and benefits, the patient was deemed in                            satisfactory condition to undergo the procedure.  After obtaining informed consent, the colonoscope                            was passed under direct vision. Throughout the                            procedure, the patient's blood pressure, pulse, and                            oxygen saturations were monitored continuously. The                            Olympus CF-HQ190L (Serial# [id number]) Colonoscope was                            introduced through the anus and advanced to  the the                            cecum, identified by appendiceal orifice and                            ileocecal valve. The colonoscopy was performed                            without difficulty. The patient tolerated the                            procedure well. The quality of the bowel                            preparation was good. The ileocecal valve,                            appendiceal orifice, and rectum were photographed. Scope In: 11:06:52 AM Scope Out: 11:29:17 AM Scope Withdrawal Time: 0 hours 17 minutes 56 seconds  Total Procedure Duration: 0 hours 22 minutes 25 seconds  Findings:                 The perianal and digital rectal examinations were                            normal.                           Three sessile polyps were found in the descending                            colon, ascending colon and cecum. The polyps were                            diminutive in size. These polyps were removed with                            a cold snare. Resection and retrieval were  complete. Verification of patient identification                            for the specimen was done. Estimated blood loss was                            minimal.                           Multiple small and large-mouthed diverticula were                            found in the sigmoid colon and descending colon.                            There was narrowing of the colon in association                            with the diverticular opening.                           The exam was otherwise without abnormality on                            direct and retroflexion views. Complications:            No immediate complications. Estimated Blood Loss:     Estimated blood loss was minimal. Impression:               - Three diminutive polyps in the descending colon,                            in the ascending colon and in the cecum, removed                            with a  cold snare. Resected and retrieved.                           - Diverticulosis in the sigmoid colon and in the                            descending colon. There was narrowing of the colon                            in association with the diverticular opening.                           - The examination was otherwise normal on direct                            and retroflexion views. Recommendation:           - Patient has a contact number available for  emergencies. The signs and symptoms of potential                            delayed complications were discussed with the                            patient. Return to normal activities tomorrow.                            Written discharge instructions were provided to the                            patient.                           - Resume previous diet.                           - Continue present medications.                           - Repeat colonoscopy likely to be recommended. The                            colonoscopy date will be determined after pathology                            results from today's exam become available for                            review. Mother had colon cancer at 61 - if patient                            fit in 5-7 years would conisder repeat colonoscopy Gatha Mayer, MD 12/28/2021 11:38:23 AM This report has been signed electronically.

## 2021-12-28 NOTE — Progress Notes (Signed)
VS completed by CW.   Pt's states no medical or surgical changes since previsit or office visit.  

## 2021-12-28 NOTE — Progress Notes (Signed)
[facility] History and Physical   Primary Care Physician:  Josetta Huddle, MD   Reason for Procedure:   CRCA screening  Plan:    colonoscopy     HPI: [name] is a 71 y.o. female here for screening colonoscopy   Past Medical History:  Diagnosis Date   Arthritis    degenerative, shoulders, both    Asthma    seasonal - randomly used inhaler , "never consistently"     Chronic kidney disease    one kidney- Left /w blockage, removed, now with one kidney    Complication of anesthesia    GERD (gastroesophageal reflux disease)    Heart murmur    Pneumonia    PONV (postoperative nausea and vomiting)    Tachycardia    back in 1970's, rec'd care for increased heartrate here in [place], was on a betablocker     Past Surgical History:  Procedure Laterality Date   ARTHRODESIS METATARSALPHALANGEAL JOINT (MTPJ) Right 02/26/2019   Procedure: Right silver bunionectomy, hallux metatarsal phalangeal joint arthrodesis;  Surgeon: Wylene Simmer, MD;  Location: [facility];  Service: Orthopedics;  Laterality: Right;   BREAST SURGERY Right 1995   biopsy-   CHOLECYSTECTOMY     ERCP  10/2009   normal - abnormal IOC   HAMMERTOE RECONSTRUCTION WITH WEIL OSTEOTOMY Right 02/26/2019   Procedure: Second and Third Weil osteotomy; Second hammertoe correction;  Surgeon: Wylene Simmer, MD;  Location: [facility];  Service: Orthopedics;  Laterality: Right;   NEPHRECTOMY     R hand- wrist     surgery- for cysts & also tendon repair    REVERSE SHOULDER ARTHROPLASTY Left 09/03/2014   Procedure: LEFT SHOULDER REVERSE ARTHROPLASTY;  Surgeon: Augustin Schooling, MD;  Location: [facility];  Service: Orthopedics;  Laterality: Left;   TENDON REPAIR     TONSILLECTOMY     TOTAL KNEE ARTHROPLASTY Right 12/09/2020   Procedure: TOTAL KNEE ARTHROPLASTY;  Surgeon: Netta Cedars, MD;  Location: [facility];  Service: Orthopedics;  Laterality: Right;   TOTAL KNEE ARTHROPLASTY Left 03/17/2021    Procedure: TOTAL KNEE ARTHROPLASTY;  Surgeon: Netta Cedars, MD;  Location: [facility];  Service: Orthopedics;  Laterality: Left;   UPPER GASTROINTESTINAL ENDOSCOPY      Prior to Admission medications   Medication Sig Start Date End Date Taking? Authorizing Provider  gabapentin (NEURONTIN) 300 MG capsule TAKE 1 CAPSULE BY MOUTH 1-2 TIMES PER DAY 60 10/10/21  Yes [provider]  albuterol (VENTOLIN HFA) 108 (90 Base) MCG/ACT inhaler 1 puff as needed    [provider]  apixaban (ELIQUIS) 2.5 MG TABS tablet Take 1 tablet (2.5 mg total) by mouth 2 (two) times daily. 03/17/21 04/16/21  Netta Cedars, MD  methocarbamol (ROBAXIN) 500 MG tablet Take 1 tablet (500 mg total) by mouth every 6 (six) hours as needed. 03/17/21   Netta Cedars, MD  ondansetron (ZOFRAN) 4 MG tablet Take 1 tablet (4 mg total) by mouth every 8 (eight) hours as needed for nausea, vomiting or refractory nausea / vomiting. 03/17/21 03/17/22  Netta Cedars, MD  oxyCODONE-acetaminophen (PERCOCET) 5-325 MG tablet Take 1-2 tablets by mouth every 4 (four) hours as needed for severe pain. 03/17/21 03/17/22  Netta Cedars, MD  saccharomyces boulardii (FLORASTOR) 250 MG capsule Digest Probiotic (S.boulardii) 250 mg capsule  TAKE 1 CAPSULE ONCE OR TWICE A DAY    [provider]    Current Outpatient Medications  Medication Sig Dispense Refill   gabapentin (NEURONTIN) 300  MG capsule TAKE 1 CAPSULE BY MOUTH 1-2 TIMES PER DAY 60     albuterol (VENTOLIN HFA) 108 (90 Base) MCG/ACT inhaler 1 puff as needed     apixaban (ELIQUIS) 2.5 MG TABS tablet Take 1 tablet (2.5 mg total) by mouth 2 (two) times daily. 60 tablet 0   methocarbamol (ROBAXIN) 500 MG tablet Take 1 tablet (500 mg total) by mouth every 6 (six) hours as needed. 60 tablet 1   ondansetron (ZOFRAN) 4 MG tablet Take 1 tablet (4 mg total) by mouth every 8 (eight) hours as needed for nausea, vomiting or refractory nausea / vomiting. 30 tablet 1   oxyCODONE-acetaminophen  (PERCOCET) 5-325 MG tablet Take 1-2 tablets by mouth every 4 (four) hours as needed for severe pain. 40 tablet 0   saccharomyces boulardii (FLORASTOR) 250 MG capsule Digest Probiotic (S.boulardii) 250 mg capsule  TAKE 1 CAPSULE ONCE OR TWICE A DAY     Current Facility-Administered Medications  Medication Dose Route Frequency Provider Last Rate Last Admin   0.9 %  sodium chloride infusion  500 mL Intravenous Once Gatha Mayer, MD        Allergies as of 12/28/2021   (No Known Allergies)    Family History  Problem Relation Age of Onset   Colon cancer Mother    Colon polyps Neg Hx    Esophageal cancer Neg Hx    Stomach cancer Neg Hx     Social History   Socioeconomic History   Marital status: Married    Spouse name: Not on file   Number of children: Not on file   Years of education: Not on file   Highest education level: Not on file  Occupational History   Not on file  Tobacco Use   Smoking status: Never   Smokeless tobacco: Never  Vaping Use   Vaping Use: Never used  Substance and Sexual Activity   Alcohol use: Not Currently    Alcohol/week: 1.0 standard drink    Types: 1 Glasses of wine per week   Drug use: No   Sexual activity: Not on file  Other Topics Concern   Not on file  Social History Narrative   Not on file   Social Determinants of Health   Financial Resource Strain: Not on file  Food Insecurity: Not on file  Transportation Needs: Not on file  Physical Activity: Not on file  Stress: Not on file  Social Connections: Not on file  Intimate Partner Violence: Not on file    Review of Systems:  All other review of systems negative except as mentioned in the HPI.  Physical Exam: Vital signs BP (!) 163/78   Pulse 99   Temp 98.6 F (37 C) (Temporal)   Ht '5\' 4"'$  (1.626 m)   Wt 154 lb (69.9 kg)   SpO2 97%   BMI 26.43 kg/m   General:   Alert,  Well-developed, well-nourished, pleasant and cooperative in NAD Lungs:  Clear throughout to  auscultation.   Heart:  Regular rate and rhythm; no murmurs, clicks, rubs,  or gallops. Abdomen:  Soft, nontender and nondistended. Normal bowel sounds.   Neuro/Psych:  Alert and cooperative. Normal mood and affect. A and O x 3   '@Terris Germano'$  Simonne Maffucci, MD, [facility] [phone] (pager) 12/28/2021 10:49 AM@

## 2021-12-28 NOTE — Patient Instructions (Addendum)
I found and removed 3 little polyps.  I will let you know pathology results and when to have another routine colonoscopy by mail and/or My Chart.   I appreciate the opportunity to care for you.  Gatha Mayer, MD, FACG  YOU HAD AN ENDOSCOPIC PROCEDURE TODAY AT [facility]:   Refer to the procedure report that was given to you for any specific questions about what was found during the examination.  If the procedure report does not answer your questions, please call your gastroenterologist to clarify.  If you requested that your care partner not be given the details of your procedure findings, then the procedure report has been included in a sealed envelope for you to review at your convenience later.  YOU SHOULD EXPECT: Some feelings of bloating in the abdomen. Passage of more gas than usual.  Walking can help get rid of the air that was put into your GI tract during the procedure and reduce the bloating. If you had a lower endoscopy (such as a colonoscopy or flexible sigmoidoscopy) you may notice spotting of blood in your stool or on the toilet paper. If you underwent a bowel prep for your procedure, you may not have a normal bowel movement for a few days.  Please Note:  You might notice some irritation and congestion in your nose or some drainage.  This is from the oxygen used during your procedure.  There is no need for concern and it should clear up in a day or so.  SYMPTOMS TO REPORT IMMEDIATELY:  Following lower endoscopy (colonoscopy or flexible sigmoidoscopy):  Excessive amounts of blood in the stool  Significant tenderness or worsening of abdominal pains  Swelling of the abdomen that is new, acute  Fever of 100F or higher  For urgent or emergent issues, a gastroenterologist can be reached at any hour by calling [phone]. Do not use MyChart messaging for urgent concerns.    DIET:  We do recommend a small meal at first, but then you may proceed to your  regular diet.  Drink plenty of fluids but you should avoid alcoholic beverages for 24 hours.  ACTIVITY:  You should plan to take it easy for the rest of today and you should NOT DRIVE or use heavy machinery until tomorrow (because of the sedation medicines used during the test).    FOLLOW UP: Our staff will call the number listed on your records 48-72 hours following your procedure to check on you and address any questions or concerns that you may have regarding the information given to you following your procedure. If we do not reach you, we will leave a message.  We will attempt to reach you two times.  During this call, we will ask if you have developed any symptoms of COVID 19. If you develop any symptoms (ie: fever, flu-like symptoms, shortness of breath, cough etc.) before then, please call [phone].  If you test positive for Covid 19 in the 2 weeks post procedure, please call and report this information to [place].    If any biopsies were taken you will be contacted by phone or by letter within the next 1-3 weeks.  Please call us at [phone] if you have not heard about the biopsies in 3 weeks.    SIGNATURES/CONFIDENTIALITY: You and/or your care partner have signed paperwork which will be entered into your electronic medical record.  These signatures attest to the fact that that the information above on your After  Visit Summary has been reviewed and is understood.  Full responsibility of the confidentiality of this discharge information lies with you and/or your care-partner.

## 2021-12-28 NOTE — Progress Notes (Signed)
Called to room to assist during endoscopic procedure.  Patient ID and intended procedure confirmed with present staff. Received instructions for my participation in the procedure from the performing physician.  

## 2021-12-29 ENCOUNTER — Telehealth: Payer: Self-pay | Admitting: *Deleted

## 2021-12-29 NOTE — Telephone Encounter (Signed)
  Follow up Call-     12/28/2021   10:38 AM  Call back number  Post procedure Call Back phone  # [phone]  Permission to leave phone message Yes     Patient questions:  Message left to call us if necessary.

## 2021-12-29 NOTE — Telephone Encounter (Signed)
  Follow up Call-     12/28/2021   10:38 AM  Call back number  Post procedure Call Back phone  # [phone]  Permission to leave phone message Yes   LMOM to call back with any questions or concerns.

## 2022-01-04 ENCOUNTER — Encounter: Payer: Self-pay | Admitting: Internal Medicine

## 2022-01-04 DIAGNOSIS — Z860101 Personal history of adenomatous and serrated colon polyps: Secondary | ICD-10-CM

## 2022-01-04 DIAGNOSIS — Z86010 Personal history of colonic polyps: Secondary | ICD-10-CM | POA: Insufficient documentation

## 2022-01-04 HISTORY — DX: Personal history of colonic polyps: Z86.010

## 2022-01-04 HISTORY — DX: Personal history of adenomatous and serrated colon polyps: Z86.0101

## 2022-03-06 DIAGNOSIS — M5451 Vertebrogenic low back pain: Secondary | ICD-10-CM | POA: Diagnosis not present

## 2022-03-10 DIAGNOSIS — M47896 Other spondylosis, lumbar region: Secondary | ICD-10-CM | POA: Diagnosis not present

## 2022-03-13 DIAGNOSIS — M47896 Other spondylosis, lumbar region: Secondary | ICD-10-CM | POA: Diagnosis not present

## 2022-03-30 DIAGNOSIS — M47816 Spondylosis without myelopathy or radiculopathy, lumbar region: Secondary | ICD-10-CM | POA: Diagnosis not present

## 2022-03-30 DIAGNOSIS — M5416 Radiculopathy, lumbar region: Secondary | ICD-10-CM | POA: Diagnosis not present

## 2022-04-16 DIAGNOSIS — M5451 Vertebrogenic low back pain: Secondary | ICD-10-CM | POA: Diagnosis not present

## 2022-04-27 DIAGNOSIS — M5451 Vertebrogenic low back pain: Secondary | ICD-10-CM | POA: Diagnosis not present

## 2022-05-02 DIAGNOSIS — M5451 Vertebrogenic low back pain: Secondary | ICD-10-CM | POA: Diagnosis not present

## 2022-05-10 DIAGNOSIS — M5451 Vertebrogenic low back pain: Secondary | ICD-10-CM | POA: Diagnosis not present

## 2022-05-18 DIAGNOSIS — M19042 Primary osteoarthritis, left hand: Secondary | ICD-10-CM | POA: Diagnosis not present

## 2022-05-18 DIAGNOSIS — Z905 Acquired absence of kidney: Secondary | ICD-10-CM | POA: Diagnosis not present

## 2022-05-18 DIAGNOSIS — M19041 Primary osteoarthritis, right hand: Secondary | ICD-10-CM | POA: Diagnosis not present

## 2022-05-24 DIAGNOSIS — M5451 Vertebrogenic low back pain: Secondary | ICD-10-CM | POA: Diagnosis not present

## 2022-05-31 DIAGNOSIS — M25512 Pain in left shoulder: Secondary | ICD-10-CM | POA: Diagnosis not present

## 2022-05-31 DIAGNOSIS — M25511 Pain in right shoulder: Secondary | ICD-10-CM | POA: Diagnosis not present

## 2022-06-29 DIAGNOSIS — R8290 Unspecified abnormal findings in urine: Secondary | ICD-10-CM | POA: Diagnosis not present

## 2022-06-29 DIAGNOSIS — R030 Elevated blood-pressure reading, without diagnosis of hypertension: Secondary | ICD-10-CM | POA: Diagnosis not present

## 2022-06-29 DIAGNOSIS — N3000 Acute cystitis without hematuria: Secondary | ICD-10-CM | POA: Diagnosis not present

## 2022-07-17 DIAGNOSIS — Z1231 Encounter for screening mammogram for malignant neoplasm of breast: Secondary | ICD-10-CM | POA: Diagnosis not present

## 2022-08-09 DIAGNOSIS — J0140 Acute pansinusitis, unspecified: Secondary | ICD-10-CM | POA: Diagnosis not present

## 2022-08-09 DIAGNOSIS — Z6827 Body mass index (BMI) 27.0-27.9, adult: Secondary | ICD-10-CM | POA: Diagnosis not present

## 2022-08-19 DIAGNOSIS — R52 Pain, unspecified: Secondary | ICD-10-CM | POA: Diagnosis not present

## 2022-08-19 DIAGNOSIS — Z789 Other specified health status: Secondary | ICD-10-CM | POA: Diagnosis not present

## 2022-08-19 DIAGNOSIS — J3489 Other specified disorders of nose and nasal sinuses: Secondary | ICD-10-CM | POA: Diagnosis not present

## 2022-08-19 DIAGNOSIS — R110 Nausea: Secondary | ICD-10-CM | POA: Diagnosis not present

## 2022-08-19 DIAGNOSIS — J0141 Acute recurrent pansinusitis: Secondary | ICD-10-CM | POA: Diagnosis not present

## 2022-08-19 DIAGNOSIS — R030 Elevated blood-pressure reading, without diagnosis of hypertension: Secondary | ICD-10-CM | POA: Diagnosis not present

## 2022-08-19 DIAGNOSIS — Z6827 Body mass index (BMI) 27.0-27.9, adult: Secondary | ICD-10-CM | POA: Diagnosis not present

## 2022-08-19 DIAGNOSIS — R051 Acute cough: Secondary | ICD-10-CM | POA: Diagnosis not present

## 2022-08-29 ENCOUNTER — Other Ambulatory Visit: Payer: Self-pay | Admitting: Physician Assistant

## 2022-08-29 ENCOUNTER — Other Ambulatory Visit: Payer: Self-pay

## 2022-08-29 ENCOUNTER — Ambulatory Visit
Admission: RE | Admit: 2022-08-29 | Discharge: 2022-08-29 | Disposition: A | Discharge status: Home / Self Care | Payer: PPO | Source: Ambulatory Visit | Attending: Physician Assistant | Admitting: Physician Assistant

## 2022-08-29 DIAGNOSIS — J45909 Unspecified asthma, uncomplicated: Secondary | ICD-10-CM | POA: Diagnosis not present

## 2022-08-29 DIAGNOSIS — R051 Acute cough: Secondary | ICD-10-CM | POA: Diagnosis not present

## 2022-08-29 DIAGNOSIS — R059 Cough, unspecified: Secondary | ICD-10-CM | POA: Diagnosis not present

## 2022-08-29 DIAGNOSIS — R000 Tachycardia, unspecified: Secondary | ICD-10-CM | POA: Diagnosis not present

## 2022-08-29 DIAGNOSIS — R0602 Shortness of breath: Secondary | ICD-10-CM | POA: Diagnosis not present

## 2022-09-04 DIAGNOSIS — J4521 Mild intermittent asthma with (acute) exacerbation: Secondary | ICD-10-CM | POA: Diagnosis not present

## 2022-09-17 DIAGNOSIS — Z23 Encounter for immunization: Secondary | ICD-10-CM | POA: Diagnosis not present

## 2022-09-17 DIAGNOSIS — Z0000 Encounter for general adult medical examination without abnormal findings: Secondary | ICD-10-CM | POA: Diagnosis not present

## 2022-09-17 DIAGNOSIS — Z5181 Encounter for therapeutic drug level monitoring: Secondary | ICD-10-CM | POA: Diagnosis not present

## 2022-09-17 DIAGNOSIS — E559 Vitamin D deficiency, unspecified: Secondary | ICD-10-CM | POA: Diagnosis not present

## 2022-09-17 DIAGNOSIS — J4521 Mild intermittent asthma with (acute) exacerbation: Secondary | ICD-10-CM | POA: Diagnosis not present

## 2022-09-17 DIAGNOSIS — R030 Elevated blood-pressure reading, without diagnosis of hypertension: Secondary | ICD-10-CM | POA: Diagnosis not present

## 2022-09-17 DIAGNOSIS — J309 Allergic rhinitis, unspecified: Secondary | ICD-10-CM | POA: Diagnosis not present

## 2022-09-17 DIAGNOSIS — I700 Atherosclerosis of aorta: Secondary | ICD-10-CM | POA: Diagnosis not present

## 2022-09-17 DIAGNOSIS — G2581 Restless legs syndrome: Secondary | ICD-10-CM | POA: Diagnosis not present

## 2022-09-17 DIAGNOSIS — G4700 Insomnia, unspecified: Secondary | ICD-10-CM | POA: Diagnosis not present

## 2022-10-10 DIAGNOSIS — M25512 Pain in left shoulder: Secondary | ICD-10-CM | POA: Diagnosis not present

## 2022-10-10 DIAGNOSIS — M25511 Pain in right shoulder: Secondary | ICD-10-CM | POA: Diagnosis not present

## 2022-11-11 ENCOUNTER — Other Ambulatory Visit: Payer: Self-pay

## 2022-11-11 ENCOUNTER — Emergency Department (HOSPITAL_COMMUNITY): Payer: PPO

## 2022-11-11 ENCOUNTER — Emergency Department (HOSPITAL_COMMUNITY)
Admission: EM | Admit: 2022-11-11 | Discharge: 2022-11-11 | Disposition: A | Discharge status: Home / Self Care | Payer: PPO | Attending: Emergency Medicine | Admitting: Emergency Medicine

## 2022-11-11 DIAGNOSIS — R1031 Right lower quadrant pain: Secondary | ICD-10-CM | POA: Diagnosis not present

## 2022-11-11 DIAGNOSIS — R110 Nausea: Secondary | ICD-10-CM | POA: Diagnosis not present

## 2022-11-11 DIAGNOSIS — I700 Atherosclerosis of aorta: Secondary | ICD-10-CM | POA: Diagnosis not present

## 2022-11-11 LAB — COMPREHENSIVE METABOLIC PANEL
ALT: 13 U/L (ref 0–44)
AST: 18 U/L (ref 15–41)
Albumin: 4.1 g/dL (ref 3.5–5.0)
Alkaline Phosphatase: 78 U/L (ref 38–126)
Anion gap: 9 (ref 5–15)
BUN: 14 mg/dL (ref 8–23)
CO2: 22 mmol/L (ref 22–32)
Calcium: 9 mg/dL (ref 8.9–10.3)
Chloride: 109 mmol/L (ref 98–111)
Creatinine, Ser: 0.89 mg/dL (ref 0.44–1.00)
GFR, Estimated: >60 mL/min (ref >60)
Glucose, Bld: 90 mg/dL (ref 70–99)
Potassium: 4.1 mmol/L (ref 3.5–5.1)
Sodium: 140 mmol/L (ref 135–145)
Total Bilirubin: 0.8 mg/dL (ref 0.3–1.2)
Total Protein: 6.9 g/dL (ref 6.5–8.1)

## 2022-11-11 LAB — CBC WITH DIFFERENTIAL/PLATELET
Abs Immature Granulocytes: 0.02 10*3/uL (ref 0.00–0.07)
Basophils Absolute: 0 10*3/uL (ref 0.0–0.1)
Basophils Relative: 1 %
Eosinophils Absolute: 0 10*3/uL (ref 0.0–0.5)
Eosinophils Relative: 1 %
HCT: 40.6 % (ref 36.0–46.0)
Hemoglobin: 13 g/dL (ref 12.0–15.0)
Immature Granulocytes: 0 %
Lymphocytes Relative: 18 %
Lymphs Abs: 1 10*3/uL (ref 0.7–4.0)
MCH: 30.3 pg (ref 26.0–34.0)
MCHC: 32 g/dL (ref 30.0–36.0)
MCV: 94.6 fL (ref 80.0–100.0)
Monocytes Absolute: 0.4 10*3/uL (ref 0.1–1.0)
Monocytes Relative: 7 %
Neutro Abs: 4.1 10*3/uL (ref 1.7–7.7)
Neutrophils Relative %: 73 %
Platelets: 214 10*3/uL (ref 150–400)
RBC: 4.29 MIL/uL (ref 3.87–5.11)
RDW: 14 % (ref 11.5–15.5)
WBC: 5.6 10*3/uL (ref 4.0–10.5)
nRBC: 0 % (ref 0.0–0.2)

## 2022-11-11 LAB — URINALYSIS, ROUTINE W REFLEX MICROSCOPIC
Bilirubin Urine: NEGATIVE
Glucose, UA: NEGATIVE mg/dL
Hgb urine dipstick: NEGATIVE
Ketones, ur: NEGATIVE mg/dL
Leukocytes,Ua: NEGATIVE
Nitrite: NEGATIVE
Protein, ur: NEGATIVE mg/dL
Specific Gravity, Urine: 1.012 (ref 1.005–1.030)
pH: 5 (ref 5.0–8.0)

## 2022-11-11 LAB — LIPASE, BLOOD: Lipase: 28 U/L (ref 11–51)

## 2022-11-11 MED ORDER — ONDANSETRON HCL 4 MG/2ML IJ SOLN: 4.0000 mg | Freq: Once | INTRAMUSCULAR | Status: DC

## 2022-11-11 MED ORDER — HYDROMORPHONE HCL 1 MG/ML IJ SOLN: 0.5000 mg | Freq: Once | INTRAMUSCULAR | Status: DC

## 2022-11-11 MED ORDER — SODIUM CHLORIDE 0.9 % IV BOLUS
500.0000 mL | Freq: Once | INTRAVENOUS | Status: AC
  Administered 2022-11-11: 500 mL via INTRAVENOUS

## 2022-11-11 MED ORDER — IOHEXOL 300 MG/ML  SOLN
100.0000 mL | Freq: Once | INTRAMUSCULAR | Status: AC | PRN
  Administered 2022-11-11: 80 mL via INTRAVENOUS

## 2022-11-11 NOTE — ED Provider Notes (Signed)
[place] EMERGENCY DEPARTMENT AT [facility] Provider Note   CSN: [id number] Arrival date & time: 11/11/22  1207     History {Add pertinent medical, surgical, social history, OB history to HPI:1} Chief Complaint  Patient presents with   Abdominal Pain   Nausea    [name] is a 72 y.o. female.  Patient complains of right lower quadrant abdominal pain.  She has had no fevers and chills.  The pain started yesterday  The history is provided by the patient and medical records. No language interpreter was used.  Abdominal Pain Pain location:  RLQ Pain quality: aching   Pain radiates to:  Does not radiate Pain severity:  Moderate Onset quality:  Sudden Timing:  Constant Progression:  Worsening Chronicity:  New Context: not alcohol use   Relieved by:  Nothing Worsened by:  Nothing Associated symptoms: no chest pain, no cough, no diarrhea, no fatigue and no hematuria        Home Medications Prior to Admission medications   Medication Sig Start Date End Date Taking? Authorizing Provider  albuterol (VENTOLIN HFA) 108 (90 Base) MCG/ACT inhaler 1 puff as needed    [provider]  gabapentin (NEURONTIN) 300 MG capsule TAKE 1 CAPSULE BY MOUTH 1-2 TIMES PER DAY 60 10/10/21   [provider]      Allergies    Patient has no known allergies.    Review of Systems   Review of Systems  Constitutional:  Negative for appetite change and fatigue.  HENT:  Negative for congestion, ear discharge and sinus pressure.   Eyes:  Negative for discharge.  Respiratory:  Negative for cough.   Cardiovascular:  Negative for chest pain.  Gastrointestinal:  Positive for abdominal pain. Negative for diarrhea.  Genitourinary:  Negative for frequency and hematuria.  Musculoskeletal:  Negative for back pain.  Skin:  Negative for rash.  Neurological:  Negative for seizures and headaches.  Psychiatric/Behavioral:  Negative for hallucinations.     Physical  Exam Updated Vital Signs BP (!) 171/91   Pulse 88   Temp 98.2 F (36.8 C)   Resp 18   Ht 5\' 4"  (1.626 m)   Wt 73.5 kg   SpO2 100%   BMI 27.81 kg/m  Physical Exam Constitutional:      Appearance: She is well-developed.  HENT:     Head: Normocephalic.     Nose: Nose normal.  Eyes:     General: No scleral icterus.    Conjunctiva/sclera: Conjunctivae normal.  Neck:     Thyroid: No thyromegaly.  Cardiovascular:     Rate and Rhythm: Normal rate and regular rhythm.     Heart sounds: No murmur heard.    No friction rub. No gallop.  Pulmonary:     Breath sounds: No stridor. No wheezing or rales.  Chest:     Chest wall: No tenderness.  Abdominal:     General: There is no distension.     Tenderness: There is no abdominal tenderness. There is no rebound.  Musculoskeletal:        General: Normal range of motion.     Cervical back: Neck supple.  Lymphadenopathy:     Cervical: No cervical adenopathy.  Skin:    Findings: No erythema or rash.  Neurological:     Mental Status: She is alert and oriented to person, place, and time.     Motor: No abnormal muscle tone.     Coordination: Coordination normal.  Psychiatric:  Behavior: Behavior normal.     ED Results / Procedures / Treatments   Labs (all labs ordered are listed, but only abnormal results are displayed) Labs Reviewed  CBC WITH DIFFERENTIAL/PLATELET  COMPREHENSIVE METABOLIC PANEL  LIPASE, BLOOD  URINALYSIS, ROUTINE W REFLEX MICROSCOPIC  I-STAT CHEM 8, ED    EKG None  Radiology No results found.  Procedures Procedures  {Document cardiac monitor, telemetry assessment procedure when appropriate:1}  Medications Ordered in ED Medications  sodium chloride 0.9 % bolus 500 mL (500 mLs Intravenous New Bag/Given 11/11/22 1505)    ED Course/ Medical Decision Making/ A&P   {   Click here for ABCD2, HEART and other calculatorsREFRESH Note before signing :1}                          Medical Decision  Making Amount and/or Complexity of Data Reviewed Labs: ordered. Radiology: ordered.   Patient with right lower quadrant pain.  CT scan pending.  {Document critical care time when appropriate:1} {Document review of labs and clinical decision tools ie heart score, Chads2Vasc2 etc:1}  {Document your independent review of radiology images, and any outside records:1} {Document your discussion with family members, caretakers, and with consultants:1} {Document social determinants of health affecting pt's care:1} {Document your decision making why or why not admission, treatments were needed:1} Final Clinical Impression(s) / ED Diagnoses Final diagnoses:  None    Rx / DC Orders ED Discharge Orders     None

## 2022-11-11 NOTE — Discharge Instructions (Signed)
Your laboratory test and CT scan did not show any signs of acute abnormalities such as diverticulitis colitis or appendicitis.  Take over-the-counter medications as we discussed for pain and discomfort.  Follow-up with your primary doctor or GI doctor later this week to be rechecked if the symptoms have not resolved

## 2022-11-11 NOTE — ED Triage Notes (Signed)
Pt arrived via POV. C/o RLQ abd pain beginning 2x nights ago, and nausea beginning last night.   Pt only has R kidney   AOx4

## 2022-11-11 NOTE — ED Notes (Signed)
Patient transported to CT 

## 2022-11-11 NOTE — ED Notes (Signed)
Pt A&Ox4 ambulatory at d/c with independent steady gait. Pt verbalized understanding of d/c instructions and follow up care. 

## 2022-11-11 NOTE — ED Provider Notes (Signed)
Patient was initially seen by Dr. Estell Harpin.  Please see his note.  Plan was to follow-up on the CT scan.  Patient CT scan does not show any acute abnormalities.  Labs otherwise unremarkable.  Discussed use of Tylenol and ibuprofen sparingly considering her solitary kidney.  Outpatient follow-up with PCP or GI if symptoms have not resolved in the next week   Linwood Dibbles, MD 11/11/22 1746

## 2023-02-04 ENCOUNTER — Other Ambulatory Visit (HOSPITAL_COMMUNITY): Payer: Self-pay | Admitting: Physician Assistant

## 2023-02-04 DIAGNOSIS — M25512 Pain in left shoulder: Secondary | ICD-10-CM | POA: Diagnosis not present

## 2023-02-04 DIAGNOSIS — M170 Bilateral primary osteoarthritis of knee: Secondary | ICD-10-CM | POA: Diagnosis not present

## 2023-02-04 DIAGNOSIS — M25511 Pain in right shoulder: Secondary | ICD-10-CM

## 2023-02-19 ENCOUNTER — Encounter (HOSPITAL_COMMUNITY)
Admission: RE | Admit: 2023-02-19 | Discharge: 2023-02-19 | Disposition: A | Discharge status: Home / Self Care | Payer: PPO | Source: Ambulatory Visit | Attending: Physician Assistant | Admitting: Physician Assistant

## 2023-02-19 DIAGNOSIS — Z96653 Presence of artificial knee joint, bilateral: Secondary | ICD-10-CM | POA: Diagnosis not present

## 2023-02-19 DIAGNOSIS — M25511 Pain in right shoulder: Secondary | ICD-10-CM | POA: Diagnosis not present

## 2023-02-19 DIAGNOSIS — M25562 Pain in left knee: Secondary | ICD-10-CM | POA: Diagnosis not present

## 2023-02-19 DIAGNOSIS — Z471 Aftercare following joint replacement surgery: Secondary | ICD-10-CM | POA: Diagnosis not present

## 2023-02-19 DIAGNOSIS — M25512 Pain in left shoulder: Secondary | ICD-10-CM | POA: Diagnosis not present

## 2023-02-19 MED ORDER — TECHNETIUM TC 99M MEDRONATE IV KIT
21.7000 | PACK | Freq: Once | INTRAVENOUS | Status: AC | PRN
  Administered 2023-02-19: 21.7 via INTRAVENOUS

## 2023-03-18 DIAGNOSIS — Z96652 Presence of left artificial knee joint: Secondary | ICD-10-CM | POA: Diagnosis not present

## 2023-03-18 DIAGNOSIS — T8484XA Pain due to internal orthopedic prosthetic devices, implants and grafts, initial encounter: Secondary | ICD-10-CM | POA: Diagnosis not present

## 2023-03-18 DIAGNOSIS — M25562 Pain in left knee: Secondary | ICD-10-CM | POA: Diagnosis not present

## 2023-03-18 DIAGNOSIS — M25561 Pain in right knee: Secondary | ICD-10-CM | POA: Diagnosis not present

## 2023-04-26 ENCOUNTER — Encounter (HOSPITAL_COMMUNITY): Admission: RE | Admit: 2023-04-26 | Payer: PPO | Source: Ambulatory Visit

## 2023-04-29 NOTE — Patient Instructions (Signed)
SURGICAL WAITING ROOM VISITATION Patients having surgery or a procedure may have no more than 2 support people in the waiting area - these visitors may rotate in the visitor waiting room.   Due to an increase in RSV and influenza rates and associated hospitalizations, children ages 65 and under may not visit patients in [facility] hospitals. If the patient needs to stay at the hospital during part of their recovery, the visitor guidelines for inpatient rooms apply.  PRE-OP VISITATION  Pre-op nurse will coordinate an appropriate time for 1 support person to accompany the patient in pre-op.  This support person may not rotate.  This visitor will be contacted when the time is appropriate for the visitor to come back in the pre-op area.  Please refer to the [facility] website for the visitor guidelines for Inpatients (after your surgery is over and you are in a regular room).  You are not required to quarantine at this time prior to your surgery. However, you must do this: Hand Hygiene often Do NOT share personal items Notify your provider if you are in close contact with someone who has COVID or you develop fever 100.4 or greater, new onset of sneezing, cough, sore throat, shortness of breath or body aches.  If you test positive for Covid or have been in contact with anyone that has tested positive in the last 10 days please notify you surgeon.    Your procedure is scheduled on:  THURSDAY  May 09, 2023  Report to [facility] Main Entrance: [address] entrance where the [employer] is available.   Report to admitting at:  06:45   AM  Call this number if you have any questions or problems the morning of surgery [phone]  Do not eat food after Midnight the night prior to your surgery/procedure.  After Midnight you may have the following liquids until  06:15 AM DAY OF SURGERY  Clear Liquid Diet Water Black Coffee (sugar ok, NO MILK/CREAM OR CREAMERS)  Tea (sugar ok, NO  MILK/CREAM OR CREAMERS) regular and decaf                             Plain Jell-O  with no fruit (NO RED)                                           Fruit ices (not with fruit pulp, NO RED)                                     Popsicles (NO RED)                                                                  Juice: NO CITRUS JUICES: only apple, WHITE grape, WHITE cranberry Sports drinks like Gatorade or Powerade (NO RED)                   The day of surgery:  Drink ONE (1) Pre-Surgery Clear Ensure at   06:15 AM the morning of surgery.  Drink in one sitting. Do not sip.  This drink was given to you during your hospital pre-op appointment visit. Nothing else to drink after completing the Pre-Surgery Clear Ensure : No candy, chewing gum or throat lozenges.    FOLLOW ANY ADDITIONAL PRE OP INSTRUCTIONS YOU RECEIVED FROM YOUR SURGEON'S OFFICE!!!   Oral Hygiene is also important to reduce your risk of infection.        Remember - BRUSH YOUR TEETH THE MORNING OF SURGERY WITH YOUR REGULAR TOOTHPASTE  Do NOT smoke after Midnight the night before surgery.  STOP TAKING all Vitamins, Herbs and supplements 1 week before your surgery.   Take ONLY these medicines the morning of surgery with A SIP OF WATER: none,  You may use your Albuterol inhaler if needed.     You may not have any metal on your body including hair pins, jewelry, and body piercing  Do not wear make-up, lotions, powders, perfumes , or deodorant  Do not wear nail polish including gel and S&S, artificial / acrylic nails, or any other type of covering on natural nails including finger and toenails. If you have artificial nails, gel coating, etc., that needs to be removed by a nail salon, Please have this removed prior to surgery. Not doing so may mean that your surgery could be cancelled or delayed if the Surgeon or anesthesia staff feels like they are unable to monitor you safely.   Do not shave 48 hours prior to surgery to avoid  nicks in your skin which may contribute to postoperative infections.   Contacts, Hearing Aids, dentures or bridgework may not be worn into surgery. DENTURES WILL BE REMOVED PRIOR TO SURGERY PLEASE DO NOT APPLY "Poly grip" OR ADHESIVES!!!  You may bring a small overnight bag with you on the day of surgery, only pack items that are not valuable. [facility] IS NOT RESPONSIBLE   FOR VALUABLES THAT ARE LOST OR STOLEN.   Do not bring your home medications to the hospital  EXCEPT PLEASE BRING YOUR ALBUTEROL INHALER. The Pharmacy will dispense medications listed on your medication list to you during your admission in the Hospital.  Special Instructions: Bring a copy of your healthcare power of attorney and living will documents the day of surgery, if you wish to have them scanned into your [facility]- EPIC  Please read over the following fact sheets you were given: IF YOU HAVE QUESTIONS ABOUT YOUR PRE-OP INSTRUCTIONS, PLEASE CALL [phone].     Pre-operative 5 CHG Bath Instructions   You can play a key role in reducing the risk of infection after surgery. Your skin needs to be as free of germs as possible. You can reduce the number of germs on your skin by washing with CHG (chlorhexidine gluconate) soap before surgery. CHG is an antiseptic soap that kills germs and continues to kill germs even after washing.   DO NOT use if you have an allergy to chlorhexidine/CHG or antibacterial soaps. If your skin becomes reddened or irritated, stop using the CHG and notify one of our RNs at [phone]  Please shower with the CHG soap starting 4 days before surgery using the following schedule: START SHOWERS ON SUNDAY  May 05, 2023  Please keep in mind the following:  DO NOT shave, including legs and  underarms, starting the day of your first shower.   You may shave your face at any point before/day of surgery.   Place clean sheets on your bed the day you start using CHG soap. Use a clean washcloth (not used since being washed) for each shower. DO NOT sleep with pets once you start using the CHG.   CHG Shower Instructions:  If you choose to wash your hair and private area, wash first with your normal shampoo/soap.  After you use shampoo/soap, rinse your hair and body thoroughly to remove shampoo/soap residue.  Turn the water OFF and apply about 3 tablespoons (45 ml) of CHG soap to a CLEAN washcloth.  Apply CHG soap ONLY FROM YOUR NECK DOWN TO YOUR TOES (washing for 3-5 minutes)  DO NOT use CHG soap on face, private areas, open wounds, or sores.  Pay special attention to the area where your surgery is being performed.  If you are having back surgery, having someone wash your back for you may be helpful.  Wait 2 minutes after CHG soap is applied, then you may rinse off the CHG soap.  Pat dry with a clean towel  Put on clean clothes/pajamas   If you choose to wear lotion, please use ONLY the CHG-compatible lotions on the back of this paper.     Additional instructions for the day of surgery: DO NOT APPLY any lotions, deodorants, cologne, or perfumes.   Put on clean/comfortable clothes.  Brush your teeth.  Ask your nurse before applying any prescription medications to the skin.      CHG Compatible Lotions   Aveeno Moisturizing lotion  Cetaphil Moisturizing Cream  Cetaphil Moisturizing Lotion  Clairol Herbal Essence Moisturizing Lotion, Dry Skin  Clairol Herbal Essence Moisturizing Lotion, Extra Dry Skin  Clairol Herbal Essence Moisturizing Lotion, Normal Skin  Curel Age Defying Therapeutic Moisturizing Lotion with Alpha Hydroxy  Curel Extreme Care Body Lotion  Curel Soothing Hands Moisturizing Hand Lotion  Curel Therapeutic Moisturizing Cream, Fragrance-Free  Curel  Therapeutic Moisturizing Lotion, Fragrance-Free  Curel Therapeutic Moisturizing Lotion, Original Formula  Eucerin Daily Replenishing Lotion  Eucerin Dry Skin Therapy Plus Alpha Hydroxy Crme  Eucerin Dry Skin Therapy Plus Alpha Hydroxy Lotion  Eucerin Original Crme  Eucerin Original Lotion  Eucerin Plus Crme Eucerin Plus Lotion  Eucerin TriLipid Replenishing Lotion  Keri Anti-Bacterial Hand Lotion  Keri Deep Conditioning Original Lotion Dry Skin Formula Softly Scented  Keri Deep Conditioning Original Lotion, Fragrance Free Sensitive Skin Formula  Keri Lotion Fast Absorbing Fragrance Free Sensitive Skin Formula  Keri Lotion Fast Absorbing Softly Scented Dry Skin Formula  Keri Original Lotion  Keri Skin Renewal Lotion Keri Silky Smooth Lotion  Keri Silky Smooth Sensitive Skin Lotion  Nivea Body Creamy Conditioning Oil  Nivea Body Extra Enriched Lotion  Nivea Body Original Lotion  Nivea Body Sheer Moisturizing Lotion Nivea Crme  Nivea Skin Firming Lotion  NutraDerm 30 Skin Lotion  NutraDerm Skin Lotion  NutraDerm Therapeutic Skin Cream  NutraDerm Therapeutic Skin Lotion  ProShield Protective Hand Cream  Provon moisturizing lotion   FAILURE TO FOLLOW THESE INSTRUCTIONS MAY RESULT IN THE CANCELLATION OF YOUR SURGERY  PATIENT SIGNATURE_________________________________  NURSE SIGNATURE__________________________________  ________________________________________________________________________     [name]    An incentive spirometer is a tool that can help keep your lungs clear and active. This tool measures how well you are filling your lungs with each breath. Taking long deep  breaths may help reverse or decrease the chance of developing breathing (pulmonary) problems (especially infection) following: A long period of time when you are unable to move or be active. BEFORE THE PROCEDURE  If the spirometer includes an indicator to show your best effort, your nurse  or respiratory therapist will set it to a desired goal. If possible, sit up straight or lean slightly forward. Try not to slouch. Hold the incentive spirometer in an upright position. INSTRUCTIONS FOR USE  Sit on the edge of your bed if possible, or sit up as far as you can in bed or on a chair. Hold the incentive spirometer in an upright position. Breathe out normally. Place the mouthpiece in your mouth and seal your lips tightly around it. Breathe in slowly and as deeply as possible, raising the piston or the ball toward the top of the column. Hold your breath for 3-5 seconds or for as long as possible. Allow the piston or ball to fall to the bottom of the column. Remove the mouthpiece from your mouth and breathe out normally. Rest for a few seconds and repeat Steps 1 through 7 at least 10 times every 1-2 hours when you are awake. Take your time and take a few normal breaths between deep breaths. The spirometer may include an indicator to show your best effort. Use the indicator as a goal to work toward during each repetition. After each set of 10 deep breaths, practice coughing to be sure your lungs are clear. If you have an incision (the cut made at the time of surgery), support your incision when coughing by placing a pillow or rolled up towels firmly against it. Once you are able to get out of bed, walk around indoors and cough well. You may stop using the incentive spirometer when instructed by your caregiver.  RISKS AND COMPLICATIONS Take your time so you do not get dizzy or light-headed. If you are in pain, you may need to take or ask for pain medication before doing incentive spirometry. It is harder to take a deep breath if you are having pain. AFTER USE Rest and breathe slowly and easily. It can be helpful to keep track of a log of your progress. Your caregiver can provide you with a simple table to help with this. If you are using the spirometer at home, follow these  instructions: SEEK MEDICAL CARE IF:  You are having difficultly using the spirometer. You have trouble using the spirometer as often as instructed. Your pain medication is not giving enough relief while using the spirometer. You develop fever of 100.5 F (38.1 C) or higher.                                                                                                    SEEK IMMEDIATE MEDICAL CARE IF:  You cough up bloody sputum that had not been present before. You develop fever of 102 F (38.9 C) or greater. You develop worsening pain at or near the incision site. MAKE SURE YOU:  Understand these instructions. Will watch your condition. Will get  help right away if you are not doing well or get worse. Document Released: 12/03/2006 Document Revised: 10/15/2011 Document Reviewed: 02/03/2007 [facility] Patient Information 2014 [place], [place].     WHAT IS A BLOOD TRANSFUSION? Blood Transfusion Information  A transfusion is the replacement of blood or some of its parts. Blood is made up of multiple cells which provide different functions. Red blood cells carry oxygen and are used for blood loss replacement. White blood cells fight against infection. Platelets control bleeding. Plasma helps clot blood. Other blood products are available for specialized needs, such as hemophilia or other clotting disorders. BEFORE THE TRANSFUSION  Who gives blood for transfusions?  Healthy volunteers who are fully evaluated to make sure their blood is safe. This is blood bank blood. Transfusion therapy is the safest it has ever been in the practice of medicine. Before blood is taken from a donor, a complete history is taken to make sure that person has no history of diseases nor engages in risky social behavior (examples are intravenous drug use or sexual activity with multiple partners). The donor's travel history is screened to minimize risk of transmitting infections, such as malaria. The donated blood  is tested for signs of infectious diseases, such as HIV and hepatitis. The blood is then tested to be sure it is compatible with you in order to minimize the chance of a transfusion reaction. If you or a relative donates blood, this is often done in anticipation of surgery and is not appropriate for emergency situations. It takes many days to process the donated blood. RISKS AND COMPLICATIONS Although transfusion therapy is very safe and saves many lives, the main dangers of transfusion include:  Getting an infectious disease. Developing a transfusion reaction. This is an allergic reaction to something in the blood you were given. Every precaution is taken to prevent this. The decision to have a blood transfusion has been considered carefully by your caregiver before blood is given. Blood is not given unless the benefits outweigh the risks. AFTER THE TRANSFUSION Right after receiving a blood transfusion, you will usually feel much better and more energetic. This is especially true if your red blood cells have gotten low (anemic). The transfusion raises the level of the red blood cells which carry oxygen, and this usually causes an energy increase. The nurse administering the transfusion will monitor you carefully for complications. HOME CARE INSTRUCTIONS  No special instructions are needed after a transfusion. You may find your energy is better. Speak with your caregiver about any limitations on activity for underlying diseases you may have. SEEK MEDICAL CARE IF:  Your condition is not improving after your transfusion. You develop redness or irritation at the intravenous (IV) site. SEEK IMMEDIATE MEDICAL CARE IF:  Any of the following symptoms occur over the next 12 hours: Shaking chills. You have a temperature by mouth above 102 F (38.9 C), not controlled by medicine. Chest, back, or muscle pain. People around you feel you are not acting correctly or are confused. Shortness of breath or  difficulty breathing. Dizziness and fainting. You get a rash or develop hives. You have a decrease in urine output. Your urine turns a dark color or changes to pink, red, or brown. Any of the following symptoms occur over the next 10 days: You have a temperature by mouth above 102 F (38.9 C), not controlled by medicine. Shortness of breath. Weakness after normal activity. The white part of the eye turns yellow (jaundice). You have a decrease in the  amount of urine or are urinating less often. Your urine turns a dark color or changes to pink, red, or brown. Document Released: 07/20/2000 Document Revised: 10/15/2011 Document Reviewed: 03/08/2008 [facility] Patient Information 2014 [place], [place].  _______________________________________________________________________

## 2023-04-29 NOTE — Progress Notes (Signed)
COVID Vaccine received:  []  No [x]  Yes Date of any COVID positive Test in last 90 days:  PCP - Eleanora Neighbor, MD  at [facility] [phone]  Clearance on Chart.  Cardiologist - none  Chest x-ray - 08-29-2022  2v  Epic EKG - (11-30-2020 epic)  Stress Test -  ECHO - 04-30-2018  Epic Cardiac Cath -   PCR screen: [x]  Ordered & Completed           []   No Order but Needs PROFEND           []   N/A for this surgery  Surgery Plan:  []  Ambulatory   []  Outpatient in bed  [x]  Admit  Anesthesia:    []  General  [x]  Spinal  []   Choice []   MAC  Pacemaker / ICD device [x]  No []  yes   Spinal Cord Stimulator:[x]  No []  Yes       History of Sleep Apnea? [x]  No []  Yes   CPAP used?- [x]  No []  Yes    Does the patient monitor blood sugar?  []  No []  Yes  [x]  N/A Patient has: [x]  NO Hx DM   []  Pre-DM   []  DM1  []   DM2 Last A1c was:        on       Blood Thinner / Instructions:  None Aspirin Instructions:  None  ERAS Protocol Ordered: []  No  [x]  Yes PRE-SURGERY [x]  ENSURE  []  G2  Patient is to be NPO after: 06:15 am  Comments: Patient was given the 5 CHG shower / bath instructions for TKA revision surgery along with 2 bottles of the CHG soap. Patient will start this on: Sunday 05-05-2023  All questions were asked and answered, Patient voiced understanding of this process.   Activity level: Patient is able / unable to climb a flight of stairs without difficulty; []  No CP  []  No SOB, but would have ___   Patient can / can not perform ADLs without assistance.   Anesthesia review: s/p Left nephrectomy, GERD, Murmur (ECHO 04-30-2018), PONV, Remote hx Tachycardia.   Patient denies shortness of breath, fever, cough and chest pain at PAT appointment.  Patient verbalized understanding and agreement to the Pre-Surgical Instructions that were given to them at this PAT appointment. Patient was also educated of the need to review these PAT instructions again prior to her surgery.I reviewed the appropriate  phone numbers to call if they have any and questions or concerns.

## 2023-04-30 ENCOUNTER — Encounter (HOSPITAL_COMMUNITY): Payer: Self-pay

## 2023-04-30 ENCOUNTER — Encounter (HOSPITAL_COMMUNITY)
Admission: RE | Admit: 2023-04-30 | Discharge: 2023-04-30 | Disposition: A | Discharge status: Home / Self Care | Payer: PPO | Source: Ambulatory Visit | Attending: Orthopedic Surgery | Admitting: Orthopedic Surgery

## 2023-04-30 ENCOUNTER — Other Ambulatory Visit: Payer: Self-pay

## 2023-04-30 VITALS — BP 155/72 | HR 76 | Temp 97.7°F | Resp 16 | Ht 64.0 in | Wt 165.0 lb

## 2023-04-30 DIAGNOSIS — Z01818 Encounter for other preprocedural examination: Secondary | ICD-10-CM | POA: Insufficient documentation

## 2023-04-30 DIAGNOSIS — I10 Essential (primary) hypertension: Secondary | ICD-10-CM | POA: Diagnosis not present

## 2023-04-30 LAB — BASIC METABOLIC PANEL
Anion gap: 12 (ref 5–15)
BUN: 20 mg/dL (ref 8–23)
CO2: 23 mmol/L (ref 22–32)
Calcium: 9.6 mg/dL (ref 8.9–10.3)
Chloride: 105 mmol/L (ref 98–111)
Creatinine, Ser: 0.8 mg/dL (ref 0.44–1.00)
GFR, Estimated: >60 mL/min (ref >60)
Glucose, Bld: 97 mg/dL (ref 70–99)
Potassium: 4.2 mmol/L (ref 3.5–5.1)
Sodium: 140 mmol/L (ref 135–145)

## 2023-04-30 LAB — CBC
HCT: 42.3 % (ref 36.0–46.0)
Hemoglobin: 13.4 g/dL (ref 12.0–15.0)
MCH: 30.5 pg (ref 26.0–34.0)
MCHC: 31.7 g/dL (ref 30.0–36.0)
MCV: 96.1 fL (ref 80.0–100.0)
Platelets: 241 10*3/uL (ref 150–400)
RBC: 4.4 MIL/uL (ref 3.87–5.11)
RDW: 13.6 % (ref 11.5–15.5)
WBC: 7.7 10*3/uL (ref 4.0–10.5)
nRBC: 0 % (ref 0.0–0.2)

## 2023-04-30 LAB — C-REACTIVE PROTEIN: CRP: 1.4 mg/dL — ABNORMAL HIGH (ref <1.0)

## 2023-04-30 LAB — TYPE AND SCREEN
ABO/RH(D): A POS
Antibody Screen: NEGATIVE

## 2023-04-30 LAB — SEDIMENTATION RATE: Sed Rate: 19 mm/hr (ref 0–22)

## 2023-05-01 LAB — SURGICAL PCR SCREEN
MRSA, PCR: NEGATIVE
Staphylococcus aureus: NEGATIVE

## 2023-05-09 ENCOUNTER — Encounter (HOSPITAL_COMMUNITY): Payer: Self-pay | Admitting: Orthopedic Surgery

## 2023-05-09 ENCOUNTER — Inpatient Hospital Stay (HOSPITAL_COMMUNITY): Payer: PPO | Admitting: Anesthesiology

## 2023-05-09 ENCOUNTER — Inpatient Hospital Stay (HOSPITAL_COMMUNITY)
Admission: RE | Admit: 2023-05-09 | Discharge: 2023-05-10 | DRG: 468 | Disposition: A | Discharge status: Home / Self Care | Payer: PPO | Attending: Orthopedic Surgery | Admitting: Orthopedic Surgery

## 2023-05-09 ENCOUNTER — Other Ambulatory Visit: Payer: Self-pay

## 2023-05-09 ENCOUNTER — Encounter (HOSPITAL_COMMUNITY)
Admission: RE | Disposition: A | Discharge status: Home / Self Care | Payer: Self-pay | Source: Home / Self Care | Attending: Orthopedic Surgery

## 2023-05-09 DIAGNOSIS — Y831 Surgical operation with implant of artificial internal device as the cause of abnormal reaction of the patient, or of later complication, without mention of misadventure at the time of the procedure: Secondary | ICD-10-CM | POA: Diagnosis present

## 2023-05-09 DIAGNOSIS — G44209 Tension-type headache, unspecified, not intractable: Secondary | ICD-10-CM | POA: Diagnosis not present

## 2023-05-09 DIAGNOSIS — M25362 Other instability, left knee: Secondary | ICD-10-CM | POA: Diagnosis not present

## 2023-05-09 DIAGNOSIS — K219 Gastro-esophageal reflux disease without esophagitis: Secondary | ICD-10-CM | POA: Diagnosis present

## 2023-05-09 DIAGNOSIS — Z96612 Presence of left artificial shoulder joint: Secondary | ICD-10-CM | POA: Diagnosis present

## 2023-05-09 DIAGNOSIS — T84033D Mechanical loosening of internal left knee prosthetic joint, subsequent encounter: Secondary | ICD-10-CM | POA: Diagnosis not present

## 2023-05-09 DIAGNOSIS — T84033A Mechanical loosening of internal left knee prosthetic joint, initial encounter: Secondary | ICD-10-CM | POA: Diagnosis not present

## 2023-05-09 DIAGNOSIS — Z800 Family history of malignant neoplasm of digestive organs: Secondary | ICD-10-CM

## 2023-05-09 DIAGNOSIS — Z96651 Presence of right artificial knee joint: Secondary | ICD-10-CM | POA: Diagnosis not present

## 2023-05-09 DIAGNOSIS — Z9181 History of falling: Secondary | ICD-10-CM

## 2023-05-09 DIAGNOSIS — Z96652 Presence of left artificial knee joint: Secondary | ICD-10-CM | POA: Diagnosis not present

## 2023-05-09 DIAGNOSIS — Z905 Acquired absence of kidney: Secondary | ICD-10-CM | POA: Diagnosis not present

## 2023-05-09 DIAGNOSIS — Z860101 Personal history of adenomatous and serrated colon polyps: Secondary | ICD-10-CM

## 2023-05-09 DIAGNOSIS — G8918 Other acute postprocedural pain: Secondary | ICD-10-CM | POA: Diagnosis not present

## 2023-05-09 DIAGNOSIS — Z9049 Acquired absence of other specified parts of digestive tract: Secondary | ICD-10-CM | POA: Diagnosis not present

## 2023-05-09 DIAGNOSIS — R1110 Vomiting, unspecified: Secondary | ICD-10-CM | POA: Diagnosis not present

## 2023-05-09 DIAGNOSIS — T84093A Other mechanical complication of internal left knee prosthesis, initial encounter: Secondary | ICD-10-CM

## 2023-05-09 DIAGNOSIS — Z01818 Encounter for other preprocedural examination: Principal | ICD-10-CM

## 2023-05-09 HISTORY — PX: TOTAL KNEE REVISION: SHX996

## 2023-05-09 SURGERY — TOTAL KNEE REVISION
Anesthesia: Spinal | Site: Knee | Laterality: Left

## 2023-05-09 MED ORDER — OXYCODONE HCL 5 MG PO TABS
10.0000 mg | ORAL_TABLET | ORAL | Status: DC | PRN
  Administered 2023-05-09: 10 mg via ORAL

## 2023-05-09 MED ORDER — SODIUM CHLORIDE (PF) 0.9 % IJ SOLN
INTRAMUSCULAR | Status: DC | PRN
  Administered 2023-05-09: 30 mL

## 2023-05-09 MED ORDER — METHOCARBAMOL 500 MG IVPB - SIMPLE MED
500.0000 mg | Freq: Four times a day (QID) | INTRAVENOUS | Status: DC | PRN
  Filled 2023-05-09: qty 55

## 2023-05-09 MED ORDER — DEXAMETHASONE SODIUM PHOSPHATE 10 MG/ML IJ SOLN: 8.0000 mg | Freq: Once | INTRAMUSCULAR | Status: DC

## 2023-05-09 MED ORDER — ALUM & MAG HYDROXIDE-SIMETH 200-200-20 MG/5ML PO SUSP: 30.0000 mL | ORAL | Status: DC | PRN

## 2023-05-09 MED ORDER — METHOCARBAMOL 500 MG PO TABS
500.0000 mg | ORAL_TABLET | Freq: Four times a day (QID) | ORAL | Status: DC | PRN
  Administered 2023-05-09: 500 mg via ORAL
  Filled 2023-05-09: qty 1

## 2023-05-09 MED ORDER — DIPHENHYDRAMINE HCL 12.5 MG/5ML PO ELIX: 12.5000 mg | ORAL_SOLUTION | ORAL | Status: DC | PRN

## 2023-05-09 MED ORDER — DEXAMETHASONE SODIUM PHOSPHATE 10 MG/ML IJ SOLN
10.0000 mg | Freq: Once | INTRAMUSCULAR | Status: AC
  Administered 2023-05-10: 10 mg via INTRAVENOUS
  Filled 2023-05-09: qty 1

## 2023-05-09 MED ORDER — ACETAMINOPHEN 325 MG PO TABS: 325.0000 mg | ORAL_TABLET | Freq: Four times a day (QID) | ORAL | Status: DC | PRN

## 2023-05-09 MED ORDER — LACTATED RINGERS IV SOLN: INTRAVENOUS | Status: DC

## 2023-05-09 MED ORDER — METOCLOPRAMIDE HCL 5 MG/ML IJ SOLN: 5.0000 mg | Freq: Three times a day (TID) | INTRAMUSCULAR | Status: DC | PRN

## 2023-05-09 MED ORDER — METOCLOPRAMIDE HCL 5 MG PO TABS: 5.0000 mg | ORAL_TABLET | Freq: Three times a day (TID) | ORAL | Status: DC | PRN

## 2023-05-09 MED ORDER — ACETAMINOPHEN 500 MG PO TABS
1000.0000 mg | ORAL_TABLET | Freq: Four times a day (QID) | ORAL | Status: DC
  Administered 2023-05-09: 1000 mg via ORAL
  Filled 2023-05-09 (×2): qty 2

## 2023-05-09 MED ORDER — POLYETHYLENE GLYCOL 3350 17 G PO PACK
17.0000 g | PACK | Freq: Two times a day (BID) | ORAL | Status: DC
  Administered 2023-05-09: 17 g via ORAL
  Filled 2023-05-09 (×2): qty 1

## 2023-05-09 MED ORDER — TRANEXAMIC ACID-NACL 1000-0.7 MG/100ML-% IV SOLN
1000.0000 mg | INTRAVENOUS | Status: AC
  Administered 2023-05-09: 1000 mg via INTRAVENOUS
  Filled 2023-05-09: qty 100

## 2023-05-09 MED ORDER — CEFAZOLIN SODIUM-DEXTROSE 2-4 GM/100ML-% IV SOLN
2.0000 g | INTRAVENOUS | Status: AC
  Administered 2023-05-09: 2 g via INTRAVENOUS
  Filled 2023-05-09: qty 100

## 2023-05-09 MED ORDER — FENTANYL CITRATE PF 50 MCG/ML IJ SOSY
50.0000 ug | PREFILLED_SYRINGE | INTRAMUSCULAR | Status: DC
  Administered 2023-05-09: 50 ug via INTRAVENOUS
  Filled 2023-05-09: qty 2

## 2023-05-09 MED ORDER — HYDROMORPHONE HCL 1 MG/ML IJ SOLN
INTRAMUSCULAR | Status: AC
  Filled 2023-05-09: qty 1

## 2023-05-09 MED ORDER — OXYCODONE HCL 5 MG/5ML PO SOLN: 5.0000 mg | Freq: Once | ORAL | Status: DC | PRN

## 2023-05-09 MED ORDER — STERILE WATER FOR IRRIGATION IR SOLN
Status: DC | PRN
  Administered 2023-05-09: 2000 mL

## 2023-05-09 MED ORDER — SENNA 8.6 MG PO TABS
2.0000 | ORAL_TABLET | Freq: Every day | ORAL | Status: DC
  Administered 2023-05-09: 17.2 mg via ORAL
  Filled 2023-05-09: qty 2

## 2023-05-09 MED ORDER — PHENOL 1.4 % MT LIQD: 1.0000 | OROMUCOSAL | Status: DC | PRN

## 2023-05-09 MED ORDER — ALBUTEROL SULFATE (2.5 MG/3ML) 0.083% IN NEBU: 3.0000 mL | INHALATION_SOLUTION | Freq: Four times a day (QID) | RESPIRATORY_TRACT | Status: DC | PRN

## 2023-05-09 MED ORDER — CEFAZOLIN SODIUM-DEXTROSE 2-4 GM/100ML-% IV SOLN
2.0000 g | Freq: Four times a day (QID) | INTRAVENOUS | Status: AC
  Administered 2023-05-09 (×2): 2 g via INTRAVENOUS
  Filled 2023-05-09 (×2): qty 100

## 2023-05-09 MED ORDER — ONDANSETRON HCL 4 MG/2ML IJ SOLN
4.0000 mg | Freq: Four times a day (QID) | INTRAMUSCULAR | Status: DC | PRN
  Administered 2023-05-09 – 2023-05-10 (×2): 4 mg via INTRAVENOUS
  Filled 2023-05-09 (×2): qty 2

## 2023-05-09 MED ORDER — PROPOFOL 500 MG/50ML IV EMUL
INTRAVENOUS | Status: DC | PRN
  Administered 2023-05-09: 100 ug/kg/min via INTRAVENOUS

## 2023-05-09 MED ORDER — SODIUM CHLORIDE 0.9 % IV SOLN: INTRAVENOUS | Status: DC

## 2023-05-09 MED ORDER — APIXABAN 2.5 MG PO TABS: 2.5000 mg | ORAL_TABLET | Freq: Two times a day (BID) | ORAL | Status: DC

## 2023-05-09 MED ORDER — POVIDONE-IODINE 10 % EX SWAB: 2.0000 | Freq: Once | CUTANEOUS | Status: DC

## 2023-05-09 MED ORDER — ORAL CARE MOUTH RINSE: 15.0000 mL | Freq: Once | OROMUCOSAL | Status: AC

## 2023-05-09 MED ORDER — BISACODYL 10 MG RE SUPP: 10.0000 mg | Freq: Every day | RECTAL | Status: DC | PRN

## 2023-05-09 MED ORDER — ONDANSETRON HCL 4 MG/2ML IJ SOLN
INTRAMUSCULAR | Status: DC | PRN
  Administered 2023-05-09: 4 mg via INTRAVENOUS

## 2023-05-09 MED ORDER — MIDAZOLAM HCL 2 MG/2ML IJ SOLN
1.0000 mg | INTRAMUSCULAR | Status: DC
  Administered 2023-05-09: 1 mg via INTRAVENOUS
  Filled 2023-05-09: qty 2

## 2023-05-09 MED ORDER — ONDANSETRON HCL 4 MG PO TABS: 4.0000 mg | ORAL_TABLET | Freq: Four times a day (QID) | ORAL | Status: DC | PRN

## 2023-05-09 MED ORDER — ONDANSETRON HCL 4 MG/2ML IJ SOLN: 4.0000 mg | Freq: Once | INTRAMUSCULAR | Status: DC | PRN

## 2023-05-09 MED ORDER — OXYCODONE HCL 5 MG PO TABS: 5.0000 mg | ORAL_TABLET | Freq: Once | ORAL | Status: DC | PRN

## 2023-05-09 MED ORDER — HYDROMORPHONE HCL 1 MG/ML IJ SOLN
0.2500 mg | INTRAMUSCULAR | Status: DC | PRN
  Administered 2023-05-09 (×2): 0.5 mg via INTRAVENOUS

## 2023-05-09 MED ORDER — DEXAMETHASONE SODIUM PHOSPHATE 10 MG/ML IJ SOLN
INTRAMUSCULAR | Status: DC | PRN
  Administered 2023-05-09: 10 mg via INTRAVENOUS

## 2023-05-09 MED ORDER — PROPOFOL 10 MG/ML IV BOLUS
INTRAVENOUS | Status: DC | PRN
  Administered 2023-05-09: 30 mg via INTRAVENOUS

## 2023-05-09 MED ORDER — KETOROLAC TROMETHAMINE 30 MG/ML IJ SOLN
INTRAMUSCULAR | Status: AC
  Filled 2023-05-09: qty 1

## 2023-05-09 MED ORDER — TRANEXAMIC ACID-NACL 1000-0.7 MG/100ML-% IV SOLN
1000.0000 mg | Freq: Once | INTRAVENOUS | Status: AC
  Administered 2023-05-09: 1000 mg via INTRAVENOUS
  Filled 2023-05-09: qty 100

## 2023-05-09 MED ORDER — CHLORHEXIDINE GLUCONATE 0.12 % MT SOLN
15.0000 mL | Freq: Once | OROMUCOSAL | Status: AC
  Administered 2023-05-09: 15 mL via OROMUCOSAL

## 2023-05-09 MED ORDER — BUPIVACAINE IN DEXTROSE 0.75-8.25 % IT SOLN
INTRATHECAL | Status: DC | PRN
  Administered 2023-05-09: 1.6 mL via INTRATHECAL

## 2023-05-09 MED ORDER — BUPIVACAINE-EPINEPHRINE 0.25% -1:200000 IJ SOLN
INTRAMUSCULAR | Status: AC
  Filled 2023-05-09: qty 1

## 2023-05-09 MED ORDER — BUPIVACAINE-EPINEPHRINE 0.25% -1:200000 IJ SOLN
INTRAMUSCULAR | Status: DC | PRN
  Administered 2023-05-09: 30 mL

## 2023-05-09 MED ORDER — 0.9 % SODIUM CHLORIDE (POUR BTL) OPTIME
TOPICAL | Status: DC | PRN
Start: 2023-05-09 — End: 2023-05-09
  Administered 2023-05-09: 1000 mL

## 2023-05-09 MED ORDER — MENTHOL 3 MG MT LOZG: 1.0000 | LOZENGE | OROMUCOSAL | Status: DC | PRN

## 2023-05-09 MED ORDER — HYDROMORPHONE HCL 1 MG/ML IJ SOLN
0.5000 mg | INTRAMUSCULAR | Status: DC | PRN
  Administered 2023-05-10: 1 mg via INTRAVENOUS
  Filled 2023-05-09 (×2): qty 1

## 2023-05-09 MED ORDER — SODIUM CHLORIDE (PF) 0.9 % IJ SOLN
INTRAMUSCULAR | Status: AC
  Filled 2023-05-09: qty 50

## 2023-05-09 MED ORDER — OXYCODONE HCL 5 MG PO TABS
5.0000 mg | ORAL_TABLET | ORAL | Status: DC | PRN
  Administered 2023-05-09: 10 mg via ORAL
  Filled 2023-05-09 (×2): qty 2

## 2023-05-09 MED ORDER — KETOROLAC TROMETHAMINE 30 MG/ML IJ SOLN
INTRAMUSCULAR | Status: DC | PRN
  Administered 2023-05-09: 30 mg

## 2023-05-09 MED ORDER — SODIUM CHLORIDE 0.9 % IR SOLN
Status: DC | PRN
Start: 2023-05-09 — End: 2023-05-09
  Administered 2023-05-09: 3000 mL

## 2023-05-09 SURGICAL SUPPLY — 66 items
ADH SKN CLS APL DERMABOND .7 (GAUZE/BANDAGES/DRESSINGS) ×1
ATTUNE PSRP INSR SZ5 8 KNEE (Insert) IMPLANT
ATUNE TIB SLV M/L 53 (Orthopedic Implant) ×1 IMPLANT
BAG COUNTER SPONGE SURGICOUNT (BAG) IMPLANT
BAG DECANTER FOR FLEXI CONT (MISCELLANEOUS) IMPLANT
BAG SPEC THK2 15X12 ZIP CLS (MISCELLANEOUS)
BAG SPNG CNTER NS LX DISP (BAG)
BAG ZIPLOCK 12X15 (MISCELLANEOUS) IMPLANT
BLADE SAW SGTL 11.0X1.19X90.0M (BLADE) IMPLANT
BLADE SAW SGTL 13.0X1.19X90.0M (BLADE) ×1 IMPLANT
BLADE SAW SGTL 81X20 HD (BLADE) ×1 IMPLANT
BLADE SURG SZ10 CARB STEEL (BLADE) ×2 IMPLANT
BNDG CMPR 6 X 5 YARDS HK CLSR (GAUZE/BANDAGES/DRESSINGS) ×1
BNDG ELASTIC 6INX 5YD STR LF (GAUZE/BANDAGES/DRESSINGS) ×1 IMPLANT
BRUSH FEMORAL CANAL (MISCELLANEOUS) IMPLANT
BSPLAT TIB 3 CMNT REV ROT PLAT (Knees) ×1 IMPLANT
CEMENT HV SMART SET (Cement) IMPLANT
COVER SURGICAL LIGHT HANDLE (MISCELLANEOUS) ×1 IMPLANT
CUFF TOURN SGL QUICK 34 (TOURNIQUET CUFF) ×1
CUFF TRNQT CYL 34X4.125X (TOURNIQUET CUFF) ×1 IMPLANT
DERMABOND ADVANCED .7 DNX12 (GAUZE/BANDAGES/DRESSINGS) ×1 IMPLANT
DRAPE INCISE IOBAN 66X45 STRL (DRAPES) ×1 IMPLANT
DRAPE U-SHAPE 47X51 STRL (DRAPES) ×1 IMPLANT
DRESSING AQUACEL AG SP 3.5X10 (GAUZE/BANDAGES/DRESSINGS) IMPLANT
DRSG AQUACEL AG ADV 3.5X10 (GAUZE/BANDAGES/DRESSINGS) ×1 IMPLANT
DRSG AQUACEL AG ADV 3.5X14 (GAUZE/BANDAGES/DRESSINGS) IMPLANT
DRSG AQUACEL AG SP 3.5X10 (GAUZE/BANDAGES/DRESSINGS)
DURAPREP 26ML APPLICATOR (WOUND CARE) ×2 IMPLANT
ELECT REM PT RETURN 15FT ADLT (MISCELLANEOUS) ×1 IMPLANT
GAUZE SPONGE 2X2 8PLY STRL LF (GAUZE/BANDAGES/DRESSINGS) IMPLANT
GLOVE BIO SURGEON STRL SZ 6 (GLOVE) ×1 IMPLANT
GLOVE BIOGEL PI IND STRL 6.5 (GLOVE) ×1 IMPLANT
GLOVE BIOGEL PI IND STRL 7.5 (GLOVE) ×1 IMPLANT
GLOVE ORTHO TXT STRL SZ7.5 (GLOVE) ×2 IMPLANT
GOWN STRL REUS W/ TWL LRG LVL3 (GOWN DISPOSABLE) ×2 IMPLANT
GOWN STRL REUS W/TWL LRG LVL3 (GOWN DISPOSABLE) ×2
HANDPIECE INTERPULSE COAX TIP (DISPOSABLE) ×1
HOLDER FOLEY CATH W/STRAP (MISCELLANEOUS) IMPLANT
INSERT TIB CMT ATTUNE RP SZ3 (Knees) IMPLANT
KIT TURNOVER KIT A (KITS) IMPLANT
MANIFOLD NEPTUNE II (INSTRUMENTS) ×1 IMPLANT
NDL SAFETY ECLIP 18X1.5 (MISCELLANEOUS) IMPLANT
NS IRRIG 1000ML POUR BTL (IV SOLUTION) ×1 IMPLANT
PACK TOTAL KNEE CUSTOM (KITS) ×1 IMPLANT
PIN FIX SIGMA LCS THRD HI (PIN) IMPLANT
PROTECTOR NERVE ULNAR (MISCELLANEOUS) ×1 IMPLANT
RESTRICTOR CEMENT SZ 5 C-STEM (Cement) IMPLANT
SET HNDPC FAN SPRY TIP SCT (DISPOSABLE) ×1 IMPLANT
SET PAD KNEE POSITIONER (MISCELLANEOUS) ×1 IMPLANT
SLEEVE ATTUNE TIB M/L 53 (Orthopedic Implant) IMPLANT
SOLUTION IRRIG SURGIPHOR (IV SOLUTION) IMPLANT
SPIKE FLUID TRANSFER (MISCELLANEOUS) IMPLANT
STAPLER VISISTAT 35W (STAPLE) IMPLANT
STEM REV CEMENTED 14X50MM (Stem) IMPLANT
SUT MNCRL AB 3-0 PS2 18 (SUTURE) ×1 IMPLANT
SUT STRATAFIX PDS+ 0 24IN (SUTURE) ×1 IMPLANT
SUT VIC AB 1 CT1 36 (SUTURE) ×1 IMPLANT
SUT VIC AB 2-0 CT1 27 (SUTURE) ×3
SUT VIC AB 2-0 CT1 TAPERPNT 27 (SUTURE) ×3 IMPLANT
SYR 50ML LL SCALE MARK (SYRINGE) IMPLANT
TOWER CARTRIDGE SMART MIX (DISPOSABLE) ×1 IMPLANT
TRAY FOLEY MTR SLVR 16FR STAT (SET/KITS/TRAYS/PACK) ×1 IMPLANT
TUBE KAMVAC SUCTION (TUBING) IMPLANT
TUBE SUCTION HIGH CAP CLEAR NV (SUCTIONS) ×1 IMPLANT
WATER STERILE IRR 1000ML POUR (IV SOLUTION) ×1 IMPLANT
WRAP KNEE MAXI GEL POST OP (GAUZE/BANDAGES/DRESSINGS) ×1 IMPLANT

## 2023-05-09 NOTE — H&P (Signed)
TOTAL KNEE REVISION ADMISSION H&P  Patient is being admitted for left revision total knee arthroplasty.  Therapy Plans: outpatient therapy at [facility] Disposition: Home with husband Planned DVT Prophylaxis: Eliquis 2.5 BID DME needed: none PCP: Dr. 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 Marlou Porch >> he recommended eliquis rather than aspirin (30 days) - oxycodone, robaxin, tylenol (stepped down to tramadol last time) - No hx of VTE or cancer - Had trouble urinating last surgery > fluids  Subjective:  Chief Complaint:left knee pain.  HPI: [name], 72 y.o. female. She has a history of Left total knee arthroplasty in 2022 by Dr. Ranell Patrick. She reports she began having pain in the knee over the summer of 2024 after a fall. She had a bone scan which revealed evidence of aseptic loosening. We obtained inflammatory markers, with CRP minimally elevated to 1.4 and CRP within normal limits.   Patient Active Problem List   Diagnosis Date Noted   Hx of adenomatous colonic polyps 01/04/2022   H/O total knee replacement, left 03/17/2021   Status post total knee replacement, right 12/09/2020   S/P shoulder replacement 09/03/2014   Past Medical History:  Diagnosis Date   Arthritis    degenerative, shoulders, both    Asthma    seasonal - randomly used inhaler , "never consistently"     Chronic kidney disease    one kidney- Left /w blockage, removed, now with one kidney    Complication of anesthesia    GERD (gastroesophageal reflux disease)    Heart murmur    randomly heard, mostly as a child   Hx of adenomatous colonic polyps 01/04/2022   Pneumonia    PONV (postoperative nausea and vomiting)    Tachycardia    back in 1970's, rec'd care for increased heartrate here in [place], was on a betablocker     Past  Surgical History:  Procedure Laterality Date   ARTHRODESIS METATARSALPHALANGEAL JOINT (MTPJ) Right 02/26/2019   Procedure: Right silver bunionectomy, hallux metatarsal phalangeal joint arthrodesis;  Surgeon: Toni Arthurs, MD;  Location: [facility];  Service: Orthopedics;  Laterality: Right;   BREAST SURGERY Right 1995   biopsy-   CHOLECYSTECTOMY     ERCP  10/2009   normal - abnormal IOC   HAMMERTOE RECONSTRUCTION WITH WEIL OSTEOTOMY Right 02/26/2019   Procedure: Second and Third Weil osteotomy; Second hammertoe correction;  Surgeon: Toni Arthurs, MD;  Location: [facility];  Service: Orthopedics;  Laterality: Right;   NEPHRECTOMY Left    R hand- wrist     surgery- for cysts & also tendon repair    REVERSE SHOULDER ARTHROPLASTY Left 09/03/2014   Procedure: LEFT SHOULDER REVERSE ARTHROPLASTY;  Surgeon: Verlee Rossetti, MD;  Location: [facility];  Service: Orthopedics;  Laterality: Left;   TONSILLECTOMY     TOTAL KNEE ARTHROPLASTY Right 12/09/2020   Procedure: TOTAL KNEE ARTHROPLASTY;  Surgeon: Beverely Low, MD;  Location: [facility];  Service: Orthopedics;  Laterality: Right;   TOTAL KNEE ARTHROPLASTY Left 03/17/2021   Procedure: TOTAL KNEE ARTHROPLASTY;  Surgeon: Beverely Low, MD;  Location: [facility];  Service: Orthopedics;  Laterality: Left;   UPPER GASTROINTESTINAL ENDOSCOPY      No current facility-administered medications for this encounter.   No Known Allergies  Social History   Tobacco Use   Smoking status: Never  Smokeless tobacco: Never  Substance Use Topics   Alcohol use: Yes    Alcohol/week: 1.0 standard drink of alcohol    Types: 1 Glasses of wine per week    Comment: occasional    Family History  Problem Relation Age of Onset   Colon cancer Mother    Colon polyps Neg Hx    Esophageal cancer Neg Hx    Stomach cancer Neg Hx       Review of Systems  Constitutional:  Negative for chills and fever.  Respiratory:  Negative for cough and  shortness of breath.   Cardiovascular:  Negative for chest pain.  Gastrointestinal:  Negative for nausea and vomiting.  Musculoskeletal:  Positive for arthralgias.      Objective:  Physical Exam Well nourished and well developed. General: Alert and oriented x3, cooperative and pleasant, no acute distress. Head: normocephalic, atraumatic, neck supple. Eyes: EOMI.  Musculoskeletal: Left knee exam: Her surgical incision is well-healed without signs of infection. No palpable effusion She does have tenderness to palpation on her proximal leg No significant flexion contracture Stable medial and lateral collateral ligaments  Right knee exam: Well-healed surgical incision without signs of infection No significant tenderness to palpation as compared to her left knee Full knee extension and flexion over 110 degrees Stable medial and lateral collateral ligaments No lower extremity edema, erythema or calf tenderness  Vital signs in last 24 hours:    Labs:  Estimated body mass index is 28.32 kg/m as calculated from the following:   Height as of 04/30/23: 5\' 4"  (1.626 m).   Weight as of 04/30/23: 74.8 kg.  Imaging Review Imaging: Standing AP and lateral both of her knees were ordered and reviewed. Radiographically her components appear to be well sized. There is no obvious concern for loosening particular of the left knee. Perhaps a small subchondral cystic change on the posterior aspect of her tibia but no obvious or progressive radiolucent lines noted.  Bone scan was performed on 02/19/2023. The images are reviewed on canopy. The report was reviewed with her indicating increased asymmetric uptake on the lateral tibial plateau of the left knee concerning for potential loosening.    Assessment/Plan: Aseptic loosening, left knee(s) with failed previous arthroplasty.   The patient history, physical examination, clinical judgment of the provider and imaging studies are consistent with  end stage degenerative joint disease of the left knee(s), previous total knee arthroplasty. Revision total knee arthroplasty is deemed medically necessary. The treatment options including medical management, injection therapy, arthroscopy and revision arthroplasty were discussed at length. The risks and benefits of revision total knee arthroplasty were presented and reviewed. The risks due to aseptic loosening, infection, stiffness, patella tracking problems, thromboembolic complications and other imponderables were discussed. The patient acknowledged the explanation, agreed to proceed with the plan and consent was signed. Patient is being admitted for inpatient treatment for surgery, pain control, PT, OT, prophylactic antibiotics, VTE prophylaxis, progressive ambulation and ADL's and discharge planning.The patient is planning to be discharged  home.  Rosalene Billings, PA-C Orthopedic Surgery [facility] [place] [phone]

## 2023-05-09 NOTE — Op Note (Signed)
NAMEDAYANI, OBERLANDER MEDICAL RECORD NO: [id number] ACCOUNT NO: [id number] DATE OF BIRTH: 1950/09/21 FACILITY: [facility] LOCATION: [facility] PH[facility]ICIAN: Madlyn Frankel. Charlann Boxer, MD  Operative Report   DATE OF PROCEDURE: 05/09/2023  PREOPERATIVE DIAGNOSIS:  Failed left total knee arthroplasty with preoperative concerns of loose tibial tray and flexion instability.  POSTOPERATIVE DIAGNOSIS: 1. Failed left total knee arthroplasty with loose left tibial tray. 2.  Slight subtle global instability.  PROCEDURE:  Revision left total knee arthroplasty with primary focus on the tibial tray and polyethylene insert.  COMPONENTS USED:  DePuy Attune revision knee system with a size 3 revision tibial tray with a 53 mm press-fit sleeve and a 14 x 50 mm cemented stem.  We used a size 8 mm posterior stabilized insert to match her previously placed 5 femur.  SURGEON:  Madlyn Frankel. Charlann Boxer, MD  ASSISTANT:  Rosalene Billings, PA-C.  Note that Ms. Domenic Schwab was present for the entirety of the case from preoperative positioning, perioperative management of the operative extremity, general facilitation of the case and primary wound closure.  ANESTHESIA:  Regional plus spinal block.  BLOOD LOSS:  Less than 100 mL.  DRAINS:  None.  COMPLICATIONS:  None.  TOURNIQUET:  Up for 35 minutes at 225 mmHg.  INDICATIONS FOR THE PROCEDURE:  The patient is a pleasant 72 year old female referred for evaluation of a painful left total knee replacement.  Her index total knee arthroplasty was performed in 05/25.  She presented with radiographic workup including  plain films and bone scan revealing concerns for increased uptake around her tibial tray laterally.  Given her pain and also some concerns of instability she was referred for surgical consideration for management.  We discussed the goals of surgery  including reducing pain by improving fixation of the new components as well as having a chance to provide stability to her ligaments in all  aspects of range of motion.  The risks of infection, DVT, component failure, need for future surgeries were  reviewed.  The risks of persistent pain and sense of instability reviewed.  Consent was obtained for the benefit of pain relief.  DESCRIPTION OF PROCEDURE:  The patient was brought to the operative theater.  Once adequate anesthesia, preoperative antibiotics, Ancef administered as well as tranexamic acid and Decadron she was positioned supine with a left thigh tourniquet placed.   Left lower extremity was then prepped and draped in sterile fashion.  A timeout was performed identifying the patient, planned procedure, and extremity.  The leg was exsanguinated and tourniquet elevated to 225 mmHg.  Her old incision was excised.  Soft  tissue planes created.  Median arthrotomy was made, encountering clear synovial fluid, no signs of infection or inflammation.  Following initial exposure medially and laterally I was able to subluxate the knee, without too much of a challenge.  We  removed the old polyethylene insert.  I subluxated the tibia anteriorly.  At this point, I used a thin ACL saw to undermine the cement bone interface.  We removed the tibial tray without bone loss.  At this point, I placed the extramedullary guide just  beneath the cement layer on the tibia and removed the bone.  We tried to make this cut more perpendicular as the index tibial tray was a little bit of valgus.  Following this, cut I sized the cut sized of the proximal tibia to be a size 3.  I then  broached up from a starting broach up to the 53 mm broach, which sat  proud of the proximal tibia as to be anticipated as I was working on improving her balance and stability of her ligaments.  We placed a trial size 3 tray into this broach system and did  trial reductions.  During the trial reduction, I found that I was going to use either a 7 or 8 mm insert, which allowed the knee to come to full extension improving the preoperative  assessment, noting hyperextension as well as balance in flexion.  The  patella was noted to track through the trochlea without application of pressure.  However, there was a little bit of patellar baja noted perhaps related to some infrapatellar scarring.  Given these findings, we removed the trial components.  We measured  and selected a size 5 cement restrictor, which was impacted to the appropriate depth based on the trial component.  We then injected the posterior capsule of the knee carefully medially with 0.25% Marcaine with epinephrine, 1 mL of Toradol and saline.   The knee was then irrigated with normal saline solution as the final components were opened and configured on the back table under direct supervision.  Once the components were configured we mixed a batch of cement.  We then dried the proximal tibia and  placed a cement dowel up into the proximal tibia and then impacted the new tibial component with good secure fit on the press-fit sleeve.  The final component basically sat where the broach was.  Based on this, we did repeat the trial and then ended up  selecting the 8 mm insert.  Again, noting that the knee came to full extension with improvement of the hyperextension as well as balanced in flexion with a normal tracking patella.  Given these findings, a final 8 mm insert to match the size 5 femur  previously placed was opened and placed into the tibial tray and the knee reduced.  The tourniquet was let down after 35 minutes without significant hemostasis required.  The knee was re-irrigated including the use of some Surgiphor iodine based product  followed by repeat saline irrigation.  The extensor mechanism was then reapproximated with the knee in approximately 40-60 degrees of flexion with #1 Vicryl and #1 Stratafix suture.  The remainder of the wound was closed in layers with 2-0 Vicryl and a  running Monocryl stitch.  The wound was clean, dry and dressed sterilely using surgical glue  and Aquacel dressing.  She was then brought to the recovery room in stable condition, tolerating the procedure well.  Postoperatively, she will be weightbearing  as tolerated.  We will see her back in the office in 2 weeks following her discharge from the hospital.  Physical therapy was already arranged.   PUS D: 05/09/2023 10:40:13 am T: 05/09/2023 11:06:00 am  JOB: [id number]/ [id number]

## 2023-05-09 NOTE — Anesthesia Procedure Notes (Signed)
Anesthesia Regional Block: Adductor canal block   Pre-Anesthetic Checklist: , timeout performed,  Correct Patient, Correct Site, Correct Laterality,  Correct Procedure, Correct Position, site marked,  Risks and benefits discussed,  Surgical consent,  Pre-op evaluation,  At surgeon's request and post-op pain management  Laterality: Left  Prep: chloraprep       Needles:  Injection technique: Single-shot  Needle Type: Echogenic Needle     Needle Length: 9cm      Additional Needles:   Procedures:,,,, ultrasound used (permanent image in chart),,    Narrative:  Start time: 05/09/2023 8:00 AM End time: 05/09/2023 8:10 AM Injection made incrementally with aspirations every 5 mL.  Performed by: Personally  Anesthesiologist: Eilene Ghazi, MD  Additional Notes: Patient tolerated the procedure well without complications

## 2023-05-09 NOTE — Discharge Instructions (Addendum)
INSTRUCTIONS AFTER JOINT REPLACEMENT   Remove items at home which could result in a fall. This includes throw rugs or furniture in walking pathways ICE to the affected joint every three hours while awake for 30 minutes at a time, for at least the first 3-5 days, and then as needed for pain and swelling.  Continue to use ice for pain and swelling. You may notice swelling that will progress down to the foot and ankle.  This is normal after surgery.  Elevate your leg when you are not up walking on it.   Continue to use the breathing machine you got in the hospital (incentive spirometer) which will help keep your temperature down.  It is common for your temperature to cycle up and down following surgery, especially at night when you are not up moving around and exerting yourself.  The breathing machine keeps your lungs expanded and your temperature down.   DIET:  As you were doing prior to hospitalization, we recommend a well-balanced diet.  DRESSING / WOUND CARE / SHOWERING  Keep the surgical dressing until follow up.  The dressing is water proof, so you can shower without any extra covering.  IF THE DRESSING FALLS OFF or the wound gets wet inside, change the dressing with sterile gauze.  Please use good hand washing techniques before changing the dressing.  Do not use any lotions or creams on the incision until instructed by your surgeon.    ACTIVITY  Increase activity slowly as tolerated, but follow the weight bearing instructions below.   No driving for 6 weeks or until further direction given by your physician.  You cannot drive while taking narcotics.  No lifting or carrying greater than 10 lbs. until further directed by your surgeon. Avoid periods of inactivity such as sitting longer than an hour when not asleep. This helps prevent blood clots.  You may return to work once you are authorized by your doctor.     WEIGHT BEARING   Weight bearing as tolerated with assist device (walker, cane,  etc) as directed, use it as long as suggested by your surgeon or therapist, typically at least 4-6 weeks.   EXERCISES  Results after joint replacement surgery are often greatly improved when you follow the exercise, range of motion and muscle strengthening exercises prescribed by your doctor. Safety measures are also important to protect the joint from further injury. Any time any of these exercises cause you to have increased pain or swelling, decrease what you are doing until you are comfortable again and then slowly increase them. If you have problems or questions, call your caregiver or physical therapist for advice.   Rehabilitation is important following a joint replacement. After just a few days of immobilization, the muscles of the leg can become weakened and shrink (atrophy).  These exercises are designed to build up the tone and strength of the thigh and leg muscles and to improve motion. Often times heat used for twenty to thirty minutes before working out will loosen up your tissues and help with improving the range of motion but do not use heat for the first two weeks following surgery (sometimes heat can increase post-operative swelling).   These exercises can be done on a training (exercise) mat, on the floor, on a table or on a bed. Use whatever works the best and is most comfortable for you.    Use music or television while you are exercising so that the exercises are a pleasant break in your  day. This will make your life better with the exercises acting as a break in your routine that you can look forward to.   Perform all exercises about fifteen times, three times per day or as directed.  You should exercise both the operative leg and the other leg as well.  Exercises include:   Quad Sets - Tighten up the muscle on the front of the thigh (Quad) and hold for 5-10 seconds.   Straight Leg Raises - With your knee straight (if you were given a brace, keep it on), lift the leg to 60  degrees, hold for 3 seconds, and slowly lower the leg.  Perform this exercise against resistance later as your leg gets stronger.  Leg Slides: Lying on your back, slowly slide your foot toward your buttocks, bending your knee up off the floor (only go as far as is comfortable). Then slowly slide your foot back down until your leg is flat on the floor again.  Angel Wings: Lying on your back spread your legs to the side as far apart as you can without causing discomfort.  Hamstring Strength:  Lying on your back, push your heel against the floor with your leg straight by tightening up the muscles of your buttocks.  Repeat, but this time bend your knee to a comfortable angle, and push your heel against the floor.  You may put a pillow under the heel to make it more comfortable if necessary.   A rehabilitation program following joint replacement surgery can speed recovery and prevent re-injury in the future due to weakened muscles. Contact your doctor or a physical therapist for more information on knee rehabilitation.    CONSTIPATION  Constipation is defined medically as fewer than three stools per week and severe constipation as less than one stool per week.  Even if you have a regular bowel pattern at home, your normal regimen is likely to be disrupted due to multiple reasons following surgery.  Combination of anesthesia, postoperative narcotics, change in appetite and fluid intake all can affect your bowels.   YOU MUST use at least one of the following options; they are listed in order of increasing strength to get the job done.  They are all available over the counter, and you may need to use some, POSSIBLY even all of these options:    Drink plenty of fluids (prune juice may be helpful) and high fiber foods Colace 100 mg by mouth twice a day  Senokot for constipation as directed and as needed Dulcolax (bisacodyl), take with full glass of water  Miralax (polyethylene glycol) once or twice a day as  needed.  If you have tried all these things and are unable to have a bowel movement in the first 3-4 days after surgery call either your surgeon or your primary doctor.    If you experience loose stools or diarrhea, hold the medications until you stool forms back up.  If your symptoms do not get better within 1 week or if they get worse, check with your doctor.  If you experience "the worst abdominal pain ever" or develop nausea or vomiting, please contact the office immediately for further recommendations for treatment.   ITCHING:  If you experience itching with your medications, try taking only a single pain pill, or even half a pain pill at a time.  You can also use Benadryl over the counter for itching or also to help with sleep.   TED HOSE STOCKINGS:  Use stockings on both  legs until for at least 2 weeks or as directed by physician office. They may be removed at night for sleeping.  MEDICATIONS:  See your medication summary on the "After Visit Summary" that nursing will review with you.  You may have some home medications which will be placed on hold until you complete the course of blood thinner medication.  It is important for you to complete the blood thinner medication as prescribed.  PRECAUTIONS:  If you experience chest pain or shortness of breath - call 911 immediately for transfer to the hospital emergency department.   If you develop a fever greater that 101 F, purulent drainage from wound, increased redness or drainage from wound, foul odor from the wound/dressing, or calf pain - CONTACT YOUR SURGEON.                                                   FOLLOW-UP APPOINTMENTS:  If you do not already have a post-op appointment, please call the office for an appointment to be seen by your surgeon.  Guidelines for how soon to be seen are listed in your "After Visit Summary", but are typically between 1-4 weeks after surgery.  OTHER INSTRUCTIONS:   Knee Replacement:  Do not place pillow  under knee, focus on keeping the knee straight while resting. CPM instructions: 0-90 degrees, 2 hours in the morning, 2 hours in the afternoon, and 2 hours in the evening. Place foam block, curve side up under heel at all times except when in CPM or when walking.  DO NOT modify, tear, cut, or change the foam block in any way.  POST-OPERATIVE OPIOID TAPER INSTRUCTIONS: It is important to wean off of your opioid medication as soon as possible. If you do not need pain medication after your surgery it is ok to stop day one. Opioids include: Codeine, Hydrocodone(Norco, Vicodin), Oxycodone(Percocet, oxycontin) and hydromorphone amongst others.  Long term and even short term use of opiods can cause: Increased pain response Dependence Constipation Depression Respiratory depression And more.  Withdrawal symptoms can include Flu like symptoms Nausea, vomiting And more Techniques to manage these symptoms Hydrate well Eat regular healthy meals Stay active Use relaxation techniques(deep breathing, meditating, yoga) Do Not substitute Alcohol to help with tapering If you have been on opioids for less than two weeks and do not have pain than it is ok to stop all together.  Plan to wean off of opioids This plan should start within one week post op of your joint replacement. Maintain the same interval or time between taking each dose and first decrease the dose.  Cut the total daily intake of opioids by one tablet each day Next start to increase the time between doses. The last dose that should be eliminated is the evening dose.   MAKE SURE YOU:  Understand these instructions.  Get help right away if you are not doing well or get worse.    Thank you for letting us be a part of your medical care team.  It is a privilege we respect greatly.  We hope these instructions will help you stay on track for a fast and full recovery!   Information on my medicine - ELIQUIS (apixaban)   Why was Eliquis  prescribed for you? Eliquis was prescribed for you to reduce the risk of blood clots forming after orthopedic  surgery.    What do You need to know about Eliquis? Take your Eliquis TWICE DAILY - one tablet in the morning and one tablet in the evening with or without food.  It would be best to take the dose about the same time each day.  If you have difficulty swallowing the tablet whole please discuss with your pharmacist how to take the medication safely.  Take Eliquis exactly as prescribed by your doctor and DO NOT stop taking Eliquis without talking to the doctor who prescribed the medication.  Stopping without other medication to take the place of Eliquis may increase your risk of developing a clot.  After discharge, you should have regular check-up appointments with your healthcare provider that is prescribing your Eliquis.  What do you do if you miss a dose? If a dose of ELIQUIS is not taken at the scheduled time, take it as soon as possible on the same day and twice-daily administration should be resumed.  The dose should not be doubled to make up for a missed dose.  Do not take more than one tablet of ELIQUIS at the same time.  Important Safety Information A possible side effect of Eliquis is bleeding. You should call your healthcare provider right away if you experience any of the following: Bleeding from an injury or your nose that does not stop. Unusual colored urine (red or dark brown) or unusual colored stools (red or black). Unusual bruising for unknown reasons. A serious fall or if you hit your head (even if there is no bleeding).  Some medicines may interact with Eliquis and might increase your risk of bleeding or clotting while on Eliquis. To help avoid this, consult your healthcare provider or pharmacist prior to using any new prescription or non-prescription medications, including herbals, vitamins, non-steroidal anti-inflammatory drugs (NSAIDs) and  supplements.  This website has more information on Eliquis (apixaban): http://www.eliquis.com/eliquis/home

## 2023-05-09 NOTE — Transfer of Care (Signed)
Immediate Anesthesia Transfer of Care Note  Patient: [name]  Procedure(s) Performed: TOTAL KNEE REVISION (Left: Knee)  Patient Location: PACU  Anesthesia Type:Spinal  Level of Consciousness: awake, alert , and oriented  Airway & Oxygen Therapy: Patient Spontanous Breathing  Post-op Assessment: Report given to RN and Post -op Vital signs reviewed and stable  Post vital signs: Reviewed and stable  Last Vitals:  Vitals Value Taken Time  BP 123/93 05/09/23 1048  Temp    Pulse 92 05/09/23 1048  Resp 15 05/09/23 1048  SpO2 96 % 05/09/23 1048  Vitals shown include unfiled device data.  Last Pain:  Vitals:   05/09/23 0820  TempSrc:   PainSc: 0-No pain         Complications: No notable events documented.

## 2023-05-09 NOTE — Anesthesia Postprocedure Evaluation (Signed)
Anesthesia Post Note  Patient: [name]  Procedure(s) Performed: TOTAL KNEE REVISION (Left: Knee)     Patient location during evaluation: PACU Anesthesia Type: Spinal Level of consciousness: oriented and awake and alert Pain management: pain level controlled Vital Signs Assessment: post-procedure vital signs reviewed and stable Respiratory status: spontaneous breathing, respiratory function stable and patient connected to nasal cannula oxygen Cardiovascular status: blood pressure returned to baseline and stable Postop Assessment: no headache, no backache and no apparent nausea or vomiting Anesthetic complications: no  No notable events documented.  Last Vitals:  Vitals:   05/09/23 1115 05/09/23 1130  BP: (!) 146/81 (!) 151/81  Pulse: 77 72  Resp: 16 (!) 27  Temp:    SpO2: 98% 95%    Last Pain:  Vitals:   05/09/23 1130  TempSrc:   PainSc: 4                  Makayli Bracken S

## 2023-05-09 NOTE — Anesthesia Preprocedure Evaluation (Signed)
Anesthesia Evaluation  Patient identified by MRN, date of birth, ID band Patient awake    Reviewed: Allergy & Precautions, H&P , NPO status , Patient's Chart, lab work & pertinent test results  History of Anesthesia Complications (+) PONV and history of anesthetic complications  Airway Mallampati: II  TM Distance: >3 FB Neck ROM: Full    Dental no notable dental hx.    Pulmonary neg pulmonary ROS   Pulmonary exam normal breath sounds clear to auscultation       Cardiovascular negative cardio ROS Normal cardiovascular exam Rhythm:Regular Rate:Normal     Neuro/Psych negative neurological ROS  negative psych ROS   GI/Hepatic Neg liver ROS,GERD  ,,  Endo/Other  negative endocrine ROS    Renal/GU negative Renal ROS  negative genitourinary   Musculoskeletal  (+) Arthritis , Osteoarthritis,    Abdominal   Peds negative pediatric ROS (+)  Hematology negative hematology ROS (+)   Anesthesia Other Findings   Reproductive/Obstetrics negative OB ROS                             Anesthesia Physical Anesthesia Plan  ASA: 2  Anesthesia Plan: Spinal   Post-op Pain Management: Regional block*   Induction: Intravenous  PONV Risk Score and Plan: 3 and Ondansetron, Dexamethasone, Propofol infusion and Treatment may vary due to age or medical condition  Airway Management Planned: Simple Face Mask  Additional Equipment:   Intra-op Plan:   Post-operative Plan:   Informed Consent: I have reviewed the patients History and Physical, chart, labs and discussed the procedure including the risks, benefits and alternatives for the proposed anesthesia with the patient or authorized representative who has indicated his/her understanding and acceptance.     Dental advisory given  Plan Discussed with: CRNA and Surgeon  Anesthesia Plan Comments:        Anesthesia Quick Evaluation

## 2023-05-09 NOTE — Brief Op Note (Signed)
05/09/2023  8:46 AM  PATIENT:  [name]  72 y.o. female  PRE-OPERATIVE DIAGNOSIS:  Failed left total knee arthroplasty  POST-OPERATIVE DIAGNOSIS:  Failed left total knee arthroplasty, loose tibial tray and subtle instability   PROCEDURE:  Procedure(s): TOTAL KNEE REVISION (Left) Tibial tray and polyethylene   SURGEON:  Surgeons and Role:    Durene Romans, MD - Primary  PHYSICIAN ASSISTANT: Rosalene Billings, PA-C  ANESTHESIA:   regional and spinal  EBL:  <100 cc  BLOOD ADMINISTERED:none  DRAINS: none   LOCAL MEDICATIONS USED:  MARCAINE     SPECIMEN:  No Specimen  DISPOSITION OF SPECIMEN:  N/A  COUNTS:  YES  TOURNIQUET:  35 min and 225 mmHg  DICTATION: .Other Dictation: Dictation Number [id number]  PLAN OF CARE: Admit for overnight observation  PATIENT DISPOSITION:  PACU - hemodynamically stable.   Delay start of Pharmacological VTE agent (>24hrs) due to surgical blood loss or risk of bleeding: no

## 2023-05-09 NOTE — Plan of Care (Signed)
  Problem: Education: Goal: Knowledge of the prescribed therapeutic regimen will improve Outcome: Progressing   Problem: Pain Management: Goal: Pain level will decrease with appropriate interventions Outcome: Progressing   Problem: Activity: Goal: Ability to avoid complications of mobility impairment will improve Outcome: Progressing   

## 2023-05-09 NOTE — Anesthesia Procedure Notes (Signed)
Spinal  Patient location during procedure: OR Start time: 05/09/2023 9:08 AM End time: 05/09/2023 9:10 AM Reason for block: surgical anesthesia Staffing Performed: resident/CRNA  Resident/CRNA: [place], Randolf Sansoucie C, CRNA Performed by: [place], Jamesen Stahnke C, CRNA Authorized by: Eilene Ghazi, MD   Preanesthetic Checklist Completed: patient identified, IV checked, site marked, risks and benefits discussed, surgical consent, monitors and equipment checked, pre-op evaluation and timeout performed Spinal Block Patient position: sitting Prep: DuraPrep and site prepped and draped Patient monitoring: heart rate, cardiac monitor, continuous pulse ox and blood pressure Approach: midline Location: L3-4 Injection technique: single-shot Needle Needle type: Pencan  Needle gauge: 24 G Needle length: 9 cm Assessment Sensory level: T4 Events: CSF return Additional Notes IV functioning, monitors applied to pt. Expiration date of kit checked and confirmed to be in date. Sterile prep and drape, hand hygiene and sterile gloved used. Pt was positioned and spine was prepped in sterile fashion. Skin was anesthetized with lidocaine. Free flow of clear CSF obtained prior to injecting local anesthetic into CSF x 1 attempt. Spinal needle aspirated freely following injection. Needle was carefully withdrawn, and pt tolerated procedure well. Loss of motor and sensory on exam post injection.

## 2023-05-09 NOTE — Anesthesia Procedure Notes (Signed)
Anesthesia Procedure Image    

## 2023-05-09 NOTE — Evaluation (Signed)
Physical Therapy Evaluation Patient Details Name: [name] MRN: [id number] DOB: 10-26-1950 Today's Date: 05/09/2023  History of Present Illness  Pt s/p L TKR revision and with hx of bil TKR, CKD and L nephrectomy  Clinical Impression  Pt admitted as above and presenting with functional mobility limitations 2* decreased L LE strength/ROM and post op pain. Pt should progress to dc home with family assist and reports first OP PT scheduled for 05/13/23.        If plan is discharge home, recommend the following: A little help with walking and/or transfers;A little help with bathing/dressing/bathroom;Assistance with cooking/housework;Assist for transportation;Help with stairs or ramp for entrance   Can travel by private vehicle        Equipment Recommendations None recommended by PT  Recommendations for Other Services       Functional Status Assessment Patient has had a recent decline in their functional status and demonstrates the ability to make significant improvements in function in a reasonable and predictable amount of time.     Precautions / Restrictions Precautions Precautions: Knee;Fall Restrictions Weight Bearing Restrictions: No Other Position/Activity Restrictions: WBAT      Mobility  Bed Mobility Overal bed mobility: Needs Assistance Bed Mobility: Supine to Sit     Supine to sit: Min assist     General bed mobility comments: Increased time with assist for L LE    Transfers Overall transfer level: Needs assistance Equipment used: Rolling walker (2 wheels) Transfers: Sit to/from Stand Sit to Stand: Min assist           General transfer comment: cues for LE management and use of UEs to self assist    Ambulation/Gait Ambulation/Gait assistance: Min assist Gait Distance (Feet): 40 Feet Assistive device: Rolling walker (2 wheels) Gait Pattern/deviations: Step-to pattern, Decreased step length - right, Decreased step length - left, Shuffle, Trunk  flexed Gait velocity: decr     General Gait Details: cues for sequence, posture and position from [employer]            Wheelchair Mobility     Tilt Bed    Modified Rankin (Stroke Patients Only)       Balance Overall balance assessment: Needs assistance Sitting-balance support: No upper extremity supported, Feet supported Sitting balance-Leahy Scale: Good     Standing balance support: Bilateral upper extremity supported Standing balance-Leahy Scale: Poor                               Pertinent Vitals/Pain Pain Assessment Pain Assessment: 0-10 Pain Score: 6  Pain Location: L knee Pain Descriptors / Indicators: Aching, Sore Pain Intervention(s): Limited activity within patient's tolerance, Monitored during session, Premedicated before session    Home Living Family/patient expects to be discharged to:: Private residence Living Arrangements: Spouse/significant other Available Help at Discharge: Family;Available 24 hours/day Type of Home: House Home Access: Stairs to enter Entrance Stairs-Rails: Right Entrance Stairs-Number of Steps: 1+2+1   Home Layout: Able to live on main level with bedroom/bathroom Home Equipment: Agricultural consultant (2 wheels)      Prior Function Prior Level of Function : Independent/Modified Independent                     Extremity/Trunk Assessment   Upper Extremity Assessment Upper Extremity Assessment: Overall WFL for tasks assessed    Lower Extremity Assessment Lower Extremity Assessment: LLE deficits/detail    Cervical / Trunk Assessment Cervical /  Trunk Assessment: Normal  Communication   Communication Communication: No apparent difficulties  Cognition Arousal: Alert Behavior During Therapy: WFL for tasks assessed/performed Overall Cognitive Status: Within Functional Limits for tasks assessed                                          General Comments      Exercises Total Joint  Exercises Ankle Circles/Pumps: AROM, Both, 15 reps, Supine   Assessment/Plan    PT Assessment Patient needs continued PT services  PT Problem List Decreased strength;Decreased range of motion;Decreased activity tolerance;Decreased balance;Decreased mobility;Decreased knowledge of use of DME;Pain       PT Treatment Interventions DME instruction;Gait training;Stair training;Functional mobility training;Therapeutic activities;Therapeutic exercise;Patient/family education    PT Goals (Current goals can be found in the Care Plan section)  Acute Rehab PT Goals Patient Stated Goal: Regain IND PT Goal Formulation: With patient Time For Goal Achievement: 05/16/23 Potential to Achieve Goals: Good    Frequency 7X/week     Co-evaluation               AM-PAC PT "6 Clicks" Mobility  Outcome Measure Help needed turning from your back to your side while in a flat bed without using bedrails?: A Little Help needed moving from lying on your back to sitting on the side of a flat bed without using bedrails?: A Little Help needed moving to and from a bed to a chair (including a wheelchair)?: A Little Help needed standing up from a chair using your arms (e.g., wheelchair or bedside chair)?: A Little Help needed to walk in hospital room?: A Little Help needed climbing 3-5 steps with a railing? : A Lot 6 Click Score: 17    End of Session Equipment Utilized During Treatment: Gait belt Activity Tolerance: Patient tolerated treatment well Patient left: in chair;with call bell/phone within reach;with chair alarm set;with family/visitor present Nurse Communication: Mobility status PT Visit Diagnosis: Unsteadiness on feet (R26.81);Difficulty in walking, not elsewhere classified (R26.2)    Time: 1641-1710 PT Time Calculation (min) (ACUTE ONLY): 29 min   Charges:   PT Evaluation $PT Eval Low Complexity: 1 Low PT Treatments $Gait Training: 8-22 mins PT General Charges $$ ACUTE PT VISIT: 1  Visit         Mauro Kaufmann PT Acute Rehabilitation Services Pager [phone] Office [phone]   Elston Aldape 05/09/2023, 5:22 PM

## 2023-05-10 ENCOUNTER — Encounter (HOSPITAL_COMMUNITY): Payer: Self-pay | Admitting: Orthopedic Surgery

## 2023-05-10 LAB — CBC
HCT: 37.2 % (ref 36.0–46.0)
Hemoglobin: 11.6 g/dL — ABNORMAL LOW (ref 12.0–15.0)
MCH: 30.5 pg (ref 26.0–34.0)
MCHC: 31.2 g/dL (ref 30.0–36.0)
MCV: 97.9 fL (ref 80.0–100.0)
Platelets: 209 10*3/uL (ref 150–400)
RBC: 3.8 MIL/uL — ABNORMAL LOW (ref 3.87–5.11)
RDW: 13.7 % (ref 11.5–15.5)
WBC: 13.7 10*3/uL — ABNORMAL HIGH (ref 4.0–10.5)
nRBC: 0 % (ref 0.0–0.2)

## 2023-05-10 LAB — BASIC METABOLIC PANEL
Anion gap: 12 (ref 5–15)
BUN: 15 mg/dL (ref 8–23)
CO2: 19 mmol/L — ABNORMAL LOW (ref 22–32)
Calcium: 8.4 mg/dL — ABNORMAL LOW (ref 8.9–10.3)
Chloride: 104 mmol/L (ref 98–111)
Creatinine, Ser: 0.73 mg/dL (ref 0.44–1.00)
GFR, Estimated: >60 mL/min (ref >60)
Glucose, Bld: 142 mg/dL — ABNORMAL HIGH (ref 70–99)
Potassium: 3.9 mmol/L (ref 3.5–5.1)
Sodium: 135 mmol/L (ref 135–145)

## 2023-05-10 MED ORDER — OXYCODONE HCL 5 MG PO TABS: 5.0000 mg | ORAL_TABLET | ORAL | 0 refills | Status: AC | PRN

## 2023-05-10 MED ORDER — POLYETHYLENE GLYCOL 3350 17 G PO PACK: 17.0000 g | PACK | Freq: Two times a day (BID) | ORAL | 0 refills | Status: AC

## 2023-05-10 MED ORDER — APIXABAN 2.5 MG PO TABS: 2.5000 mg | ORAL_TABLET | Freq: Two times a day (BID) | ORAL | 0 refills | Status: AC

## 2023-05-10 MED ORDER — ONDANSETRON HCL 4 MG PO TABS: 4.0000 mg | ORAL_TABLET | Freq: Four times a day (QID) | ORAL | 0 refills | Status: AC | PRN

## 2023-05-10 MED ORDER — SENNA 8.6 MG PO TABS: 2.0000 | ORAL_TABLET | Freq: Every day | ORAL | 0 refills | Status: AC

## 2023-05-10 MED ORDER — PROMETHAZINE HCL 12.5 MG PO TABS: 12.5000 mg | ORAL_TABLET | Freq: Four times a day (QID) | ORAL | 0 refills | Status: AC | PRN

## 2023-05-10 MED ORDER — PROMETHAZINE HCL 25 MG PO TABS: 12.5000 mg | ORAL_TABLET | Freq: Four times a day (QID) | ORAL | Status: DC | PRN

## 2023-05-10 MED ORDER — METHOCARBAMOL 500 MG PO TABS: 500.0000 mg | ORAL_TABLET | Freq: Four times a day (QID) | ORAL | 2 refills | Status: AC | PRN

## 2023-05-10 MED ORDER — ASPIRIN 81 MG PO CHEW
81.0000 mg | CHEWABLE_TABLET | Freq: Two times a day (BID) | ORAL | Status: DC
  Administered 2023-05-10: 81 mg via ORAL
  Filled 2023-05-10: qty 1

## 2023-05-10 NOTE — Progress Notes (Signed)
Physical Therapy Treatment Patient Details Name: [name] MRN: [id number] DOB: [birth year] Today's Date: 05/10/2023   History of Present Illness Pt s/p L TKR revision and with hx of bil TKR, CKD and L nephrectomy    PT Comments  Pt continues very motivated and progressing well with mobility.  Pt up to ambulate in halls, negotiated stairs, performed bed mobility sans assist and reviewed written HEP.  Pt hopeful for dc home this date.    If plan is discharge home, recommend the following: A little help with walking and/or transfers;A little help with bathing/dressing/bathroom;Assistance with cooking/housework;Assist for transportation;Help with stairs or ramp for entrance   Can travel by private vehicle        Equipment Recommendations  None recommended by PT    Recommendations for Other Services       Precautions / Restrictions Precautions Precautions: Knee;Fall Restrictions Weight Bearing Restrictions: No LLE Weight Bearing: Weight bearing as tolerated Other Position/Activity Restrictions: WBAT     Mobility  Bed Mobility Overal bed mobility: Needs Assistance Bed Mobility: Supine to Sit, Sit to Supine     Supine to sit: Supervision Sit to supine: Supervision   General bed mobility comments: Increased time but no physical assist    Transfers Overall transfer level: Needs assistance Equipment used: Rolling walker (2 wheels) Transfers: Sit to/from Stand Sit to Stand: Contact guard assist, Supervision           General transfer comment: cues for LE management and use of UEs to self assist    Ambulation/Gait Ambulation/Gait assistance: Contact guard assist, Supervision Gait Distance (Feet): 110 Feet Assistive device: Rolling walker (2 wheels) Gait Pattern/deviations: Step-to pattern, Decreased step length - right, Decreased step length - left, Shuffle, Trunk flexed Gait velocity: decr     General Gait Details: min cues for sequence, posture and position  from RW   Stairs Stairs: Yes Stairs assistance: Contact guard assist Stair Management: No rails, One rail Right, Step to pattern, Backwards, Forwards, With walker, With crutches Number of Stairs: 5 General stair comments: single step fwd and bkwd with RW; 3 steps fwd with rail and crutch; min cues for sequence   Wheelchair Mobility     Tilt Bed    Modified Rankin (Stroke Patients Only)       Balance Overall balance assessment: Needs assistance Sitting-balance support: No upper extremity supported, Feet supported Sitting balance-Leahy Scale: Good     Standing balance support: Single extremity supported Standing balance-Leahy Scale: Poor                              Cognition Arousal: Alert Behavior During Therapy: WFL for tasks assessed/performed Overall Cognitive Status: Within Functional Limits for tasks assessed                                          Exercises Total Joint Exercises Ankle Circles/Pumps: AROM, Both, 15 reps, Supine Quad Sets: AROM, Both, 10 reps, Supine Heel Slides: AAROM, Left, 15 reps, Supine Straight Leg Raises: AAROM, AROM, Left, 10 reps, Supine    General Comments        Pertinent Vitals/Pain Pain Assessment Pain Assessment: 0-10 Pain Score: 5  Pain Location: L knee Pain Descriptors / Indicators: Aching, Sore Pain Intervention(s): Limited activity within patient's tolerance, Monitored during session, Ice applied    Home Living  Prior Function            PT Goals (current goals can now be found in the care plan section) Acute Rehab PT Goals Patient Stated Goal: Regain IND PT Goal Formulation: With patient Time For Goal Achievement: 05/16/23 Potential to Achieve Goals: Good Progress towards PT goals: Progressing toward goals    Frequency    7X/week      PT Plan      Co-evaluation              AM-PAC PT "6 Clicks" Mobility   Outcome Measure   Help needed turning from your back to your side while in a flat bed without using bedrails?: A Little Help needed moving from lying on your back to sitting on the side of a flat bed without using bedrails?: A Little Help needed moving to and from a bed to a chair (including a wheelchair)?: A Little Help needed standing up from a chair using your arms (e.g., wheelchair or bedside chair)?: A Little Help needed to walk in hospital room?: A Little Help needed climbing 3-5 steps with a railing? : A Little 6 Click Score: 18    End of Session Equipment Utilized During Treatment: Gait belt Activity Tolerance: Patient tolerated treatment well Patient left: with call bell/phone within reach;in bed;with bed alarm set Nurse Communication: Mobility status PT Visit Diagnosis: Unsteadiness on feet (R26.81);Difficulty in walking, not elsewhere classified (R26.2)     Time: 1610-9604 PT Time Calculation (min) (ACUTE ONLY): 32 min  Charges:    $Gait Training: 8-22 mins $Therapeutic Exercise: 8-22 mins $Therapeutic Activity: 8-22 mins PT General Charges $$ ACUTE PT VISIT: 1 Visit                     Mauro Kaufmann PT Acute Rehabilitation Services Pager [phone] Office [phone]    Glenis Musolf 05/10/2023, 3:19 PM

## 2023-05-10 NOTE — Progress Notes (Signed)
   Subjective: 1 Day Post-Op Procedure(s) (LRB): TOTAL KNEE REVISION (Left) Patient reports pain as mild.   Patient seen in rounds for Dr. Charlann Boxer. Patient is resting in bed on exam. She had a difficult night. She reports a headache which feels like a tension headache that began the night before surgery, and worsened yesterday. She did vomit once, but is feeling a bit better today. Foley catheter removed. Patient ambulated 40 feet with PT.  We will continue therapy today.   Objective: Vital signs in last 24 hours: Temp:  [97.5 F (36.4 C)-98.4 F (36.9 C)] 98.3 F (36.8 C) (10/04 0546) Pulse Rate:  [63-92] 71 (10/04 0546) Resp:  [13-29] 17 (10/04 0546) BP: (123-164)/(65-93) 126/65 (10/04 0546) SpO2:  [90 %-100 %] 99 % (10/04 0546)  Intake/Output from previous day:  Intake/Output Summary (Last 24 hours) at 05/10/2023 0747 Last data filed at 05/10/2023 0546 Gross per 24 hour  Intake 1763.39 ml  Output 1550 ml  Net 213.39 ml     Intake/Output this shift: No intake/output data recorded.  Labs: Recent Labs    05/10/23 0335  HGB 11.6*   Recent Labs    05/10/23 0335  WBC 13.7*  RBC 3.80*  HCT 37.2  PLT 209   Recent Labs    05/10/23 0335  NA 135  K 3.9  CL 104  CO2 19*  BUN 15  CREATININE 0.73  GLUCOSE 142*  CALCIUM 8.4*   No results for input(s): "LABPT", "INR" in the last 72 hours.  Exam: General - Patient is Alert and Oriented Extremity - Neurologically intact Sensation intact distally Intact pulses distally Dorsiflexion/Plantar flexion intact Dressing - dressing C/D/I Motor Function - intact, moving foot and toes well on exam.   Past Medical History:  Diagnosis Date   Arthritis    degenerative, shoulders, both    Asthma    seasonal - randomly used inhaler , "never consistently"     Chronic kidney disease    one kidney- Left /w blockage, removed, now with one kidney    Complication of anesthesia    GERD (gastroesophageal reflux disease)    Heart  murmur    randomly heard, mostly as a child   Hx of adenomatous colonic polyps 01/04/2022   Pneumonia    PONV (postoperative nausea and vomiting)    Tachycardia    back in 1970's, rec'd care for increased heartrate here in [place], was on a betablocker     Assessment/Plan: 1 Day Post-Op Procedure(s) (LRB): TOTAL KNEE REVISION (Left) Principal Problem:   S/P revision of total knee, left  Estimated body mass index is 28.32 kg/m as calculated from the following:   Height as of this encounter: 5\' 4"  (1.626 m).   Weight as of this encounter: 74.8 kg. Advance diet Up with therapy D/C IV fluids    DVT Prophylaxis - Will do aspirin 81 mg BID today, but d/c home on Eliquis Weight bearing as tolerated.  Hgb stable at 11.6 this AM.  Plan is to go Home after hospital stay. Plan for discharge today following 1-2 sessions of PT as long as they are meeting their goals. Patient is scheduled for OPPT. Follow up in the office in 2 weeks.   Rosalene Billings, PA-C Orthopedic Surgery [phone] 05/10/2023, 7:47 AM

## 2023-05-10 NOTE — TOC Transition Note (Signed)
Transition of Care [facility]) - CM/SW Discharge Note  Patient Details  Name: [name] MRN: [id number] Date of Birth: 1951/03/19  Transition of Care [facility] CM/SW Contact:  Ewing Schlein, LCSW Phone Number: 05/10/2023, 9:56 AM  Clinical Narrative: Patient is expected to discharge home after working with PT. CSW met with patient to confirm discharge plan. Patient will go home with OPPT at [facility]. Patient has a rolling walker at home, so there are no DME needs at this time. TOC signing off.    Final next level of care: OP Rehab Barriers to Discharge: No Barriers Identified  Patient Goals and CMS Choice Choice offered to / list presented to : NA  Discharge Plan and Services Additional resources added to the After Visit Summary for         DME Arranged: N/A DME Agency: NA  Social Determinants of Health (SDOH) Interventions SDOH Screenings   Food Insecurity: Patient Declined (05/09/2023)  Housing: Patient Declined (05/09/2023)  Transportation Needs: Patient Declined (05/09/2023)  Utilities: Patient Declined (05/09/2023)  Social Connections: Unknown (12/19/2021)   Received from [facility], [facility]  Tobacco Use: Low Risk  (05/09/2023)   Readmission Risk Interventions     No data to display

## 2023-05-10 NOTE — Progress Notes (Signed)
Physical Therapy Treatment Patient Details Name: [name] MRN: [id number] DOB: [birth year] Today's Date: 05/10/2023   History of Present Illness Pt s/p L TKR revision and with hx of bil TKR, CKD and L nephrectomy    PT Comments  Pt very cooperative and progressing with mobility despite ongoing nausea.  Pt performed HEP with assist and up to ambulate increased distance in hall with noted improvement in stability.  Pt uncertain regarding DC 2* ongoing nausea and vomiting.     If plan is discharge home, recommend the following: A little help with walking and/or transfers;A little help with bathing/dressing/bathroom;Assistance with cooking/housework;Assist for transportation;Help with stairs or ramp for entrance   Can travel by private vehicle        Equipment Recommendations  None recommended by PT    Recommendations for Other Services       Precautions / Restrictions Precautions Precautions: Knee;Fall Restrictions Weight Bearing Restrictions: No Other Position/Activity Restrictions: WBAT     Mobility  Bed Mobility Overal bed mobility: Needs Assistance Bed Mobility: Supine to Sit, Sit to Supine     Supine to sit: Supervision Sit to supine: Supervision   General bed mobility comments: Increased time but no physical assist    Transfers Overall transfer level: Needs assistance Equipment used: Rolling walker (2 wheels) Transfers: Sit to/from Stand Sit to Stand: Contact guard assist           General transfer comment: cues for LE management and use of UEs to self assist    Ambulation/Gait Ambulation/Gait assistance: Contact guard assist, Supervision Gait Distance (Feet): 111 Feet Assistive device: Rolling walker (2 wheels) Gait Pattern/deviations: Step-to pattern, Decreased step length - right, Decreased step length - left, Shuffle, Trunk flexed Gait velocity: decr     General Gait Details: cues for sequence, posture and position from [employer]              Wheelchair Mobility     Tilt Bed    Modified Rankin (Stroke Patients Only)       Balance Overall balance assessment: Needs assistance Sitting-balance support: No upper extremity supported, Feet supported Sitting balance-Leahy Scale: Good     Standing balance support: Single extremity supported Standing balance-Leahy Scale: Poor                              Cognition Arousal: Alert Behavior During Therapy: WFL for tasks assessed/performed Overall Cognitive Status: Within Functional Limits for tasks assessed                                          Exercises Total Joint Exercises Ankle Circles/Pumps: AROM, Both, 15 reps, Supine Quad Sets: AROM, Both, 10 reps, Supine Heel Slides: AAROM, Left, 15 reps, Supine Straight Leg Raises: AAROM, AROM, Left, 10 reps, Supine    General Comments        Pertinent Vitals/Pain Pain Assessment Pain Assessment: 0-10 Pain Score: 6  Pain Location: L knee Pain Descriptors / Indicators: Aching, Sore Pain Intervention(s): Limited activity within patient's tolerance, Monitored during session, Ice applied (no premed 2* ongoing nausea)    Home Living                          Prior Function  PT Goals (current goals can now be found in the care plan section) Acute Rehab PT Goals Patient Stated Goal: Regain IND PT Goal Formulation: With patient Time For Goal Achievement: 05/16/23 Potential to Achieve Goals: Good Progress towards PT goals: Progressing toward goals    Frequency    7X/week      PT Plan      Co-evaluation              AM-PAC PT "6 Clicks" Mobility   Outcome Measure  Help needed turning from your back to your side while in a flat bed without using bedrails?: A Little Help needed moving from lying on your back to sitting on the side of a flat bed without using bedrails?: A Little Help needed moving to and from a bed to a chair (including a  wheelchair)?: A Little Help needed standing up from a chair using your arms (e.g., wheelchair or bedside chair)?: A Little Help needed to walk in hospital room?: A Little Help needed climbing 3-5 steps with a railing? : A Lot 6 Click Score: 17    End of Session Equipment Utilized During Treatment: Gait belt Activity Tolerance: Patient tolerated treatment well Patient left: with call bell/phone within reach;in bed;with bed alarm set Nurse Communication: Mobility status PT Visit Diagnosis: Unsteadiness on feet (R26.81);Difficulty in walking, not elsewhere classified (R26.2)     Time: 6962-9528 PT Time Calculation (min) (ACUTE ONLY): 39 min  Charges:    $Gait Training: 8-22 mins $Therapeutic Exercise: 8-22 mins PT General Charges $$ ACUTE PT VISIT: 1 Visit                     [name] PT Acute Rehabilitation Services Pager [phone] Office [phone]    [name] 05/10/2023, 12:58 PM

## 2023-05-13 DIAGNOSIS — M25662 Stiffness of left knee, not elsewhere classified: Secondary | ICD-10-CM | POA: Diagnosis not present

## 2023-05-13 DIAGNOSIS — M25562 Pain in left knee: Secondary | ICD-10-CM | POA: Diagnosis not present

## 2023-05-15 DIAGNOSIS — M25662 Stiffness of left knee, not elsewhere classified: Secondary | ICD-10-CM | POA: Diagnosis not present

## 2023-05-15 DIAGNOSIS — M25562 Pain in left knee: Secondary | ICD-10-CM | POA: Diagnosis not present

## 2023-05-16 NOTE — Discharge Summary (Signed)
Patient ID: [name] MRN: [id number] DOB/AGE: [birth year] 72 y.o.  Admit date: 05/09/2023 Discharge date: 05/10/2023  Admission Diagnoses:  Failed left total knee arthroplasty   Discharge Diagnoses:  Principal Problem:   S/P revision of total knee, left   Past Medical History:  Diagnosis Date   Arthritis    degenerative, shoulders, both    Asthma    seasonal - randomly used inhaler , "never consistently"     Chronic kidney disease    one kidney- Left /w blockage, removed, now with one kidney    Complication of anesthesia    GERD (gastroesophageal reflux disease)    Heart murmur    randomly heard, mostly as a child   Hx of adenomatous colonic polyps 01/04/2022   Pneumonia    PONV (postoperative nausea and vomiting)    Tachycardia    back in 1970's, rec'd care for increased heartrate here in [place], was on a betablocker     Surgeries: Procedure(s): TOTAL KNEE REVISION on 05/09/2023   Consultants:   Discharged Condition: Improved  Hospital Course: [name] is an 72 y.o. female who was admitted 05/09/2023 for operative treatment ofS/P revision of total knee, left. Patient has severe unremitting pain that affects sleep, daily activities, and work/hobbies. After pre-op clearance the patient was taken to the operating room on 05/09/2023 and underwent  Procedure(s): TOTAL KNEE REVISION.    Patient was given perioperative antibiotics:  Anti-infectives (From admission, onward)    Start     Dose/Rate Route Frequency Ordered Stop   05/09/23 1615  ceFAZolin (ANCEF) IVPB 2g/100 mL premix        2 g 200 mL/hr over 30 Minutes Intravenous Every 6 hours 05/09/23 1529 05/10/23 0701   05/09/23 0700  ceFAZolin (ANCEF) IVPB 2g/100 mL premix        2 g 200 mL/hr over 30 Minutes Intravenous On call to O.R. 05/09/23 1601 05/09/23 0912        Patient was given sequential compression devices, early ambulation, and chemoprophylaxis to prevent DVT. Patient worked with PT and was  meeting their goals regarding safe ambulation and transfers.  Patient benefited maximally from hospital stay and there were no complications.    Recent vital signs: No data found.   Recent laboratory studies: No results for input(s): "WBC", "HGB", "HCT", "PLT", "NA", "K", "CL", "CO2", "BUN", "CREATININE", "GLUCOSE", "INR", "CALCIUM" in the last 72 hours.  Invalid input(s): "PT", "2"   Discharge Medications:   Allergies as of 05/10/2023   No Known Allergies      Medication List     TAKE these medications    acidophilus Caps capsule Take 1 capsule by mouth daily.   albuterol 108 (90 Base) MCG/ACT inhaler Commonly known as: VENTOLIN HFA Inhale 1-2 puffs into the lungs every 6 (six) hours as needed for shortness of breath or wheezing.   apixaban 2.5 MG Tabs tablet Commonly known as: ELIQUIS Take 1 tablet (2.5 mg total) by mouth 2 (two) times daily.   methocarbamol 500 MG tablet Commonly known as: ROBAXIN Take 1 tablet (500 mg total) by mouth every 6 (six) hours as needed for muscle spasms.   multivitamin with minerals Tabs tablet Take 1 tablet by mouth daily.   ondansetron 4 MG tablet Commonly known as: ZOFRAN Take 1 tablet (4 mg total) by mouth every 6 (six) hours as needed for nausea.   oxyCODONE 5 MG immediate release tablet Commonly known as: Oxy IR/ROXICODONE Take 1 tablet (5 mg total) by mouth  every 4 (four) hours as needed for severe pain.   polyethylene glycol 17 g packet Commonly known as: MIRALAX / GLYCOLAX Take 17 g by mouth 2 (two) times daily.   promethazine 12.5 MG tablet Commonly known as: PHENERGAN Take 1 tablet (12.5 mg total) by mouth every 6 (six) hours as needed for refractory nausea / vomiting (Take for persistent nausea/vomiting despite zofran).   senna 8.6 MG Tabs tablet Commonly known as: SENOKOT Take 2 tablets (17.2 mg total) by mouth at bedtime for 14 days.               Discharge Care Instructions  (From admission, onward)            Start     Ordered   05/10/23 0000  Change dressing       Comments: Maintain surgical dressing until follow up in the clinic. If the edges start to pull up, may reinforce with tape. If the dressing is no longer working, may remove and cover with gauze and tape, but must keep the area dry and clean.  Call with any questions or concerns.   05/10/23 0751            Diagnostic Studies: No results found.  Disposition: Discharge disposition: 01-Home or Self Care       Discharge Instructions     Call MD / Call 911   Complete by: As directed    If you experience chest pain or shortness of breath, CALL 911 and be transported to the hospital emergency room.  If you develope a fever above 101 F, pus (white drainage) or increased drainage or redness at the wound, or calf pain, call your surgeon's office.   Change dressing   Complete by: As directed    Maintain surgical dressing until follow up in the clinic. If the edges start to pull up, may reinforce with tape. If the dressing is no longer working, may remove and cover with gauze and tape, but must keep the area dry and clean.  Call with any questions or concerns.   Constipation Prevention   Complete by: As directed    Drink plenty of fluids.  Prune juice may be helpful.  You may use a stool softener, such as Colace (over the counter) 100 mg twice a day.  Use MiraLax (over the counter) for constipation as needed.   Diet - low sodium heart healthy   Complete by: As directed    Increase activity slowly as tolerated   Complete by: As directed    Weight bearing as tolerated with assist device (walker, cane, etc) as directed, use it as long as suggested by your surgeon or therapist, typically at least 4-6 weeks.   Post-operative opioid taper instructions:   Complete by: As directed    POST-OPERATIVE OPIOID TAPER INSTRUCTIONS: It is important to wean off of your opioid medication as soon as possible. If you do not need pain  medication after your surgery it is ok to stop day one. Opioids include: Codeine, Hydrocodone(Norco, Vicodin), Oxycodone(Percocet, oxycontin) and hydromorphone amongst others.  Long term and even short term use of opiods can cause: Increased pain response Dependence Constipation Depression Respiratory depression And more.  Withdrawal symptoms can include Flu like symptoms Nausea, vomiting And more Techniques to manage these symptoms Hydrate well Eat regular healthy meals Stay active Use relaxation techniques(deep breathing, meditating, yoga) Do Not substitute Alcohol to help with tapering If you have been on opioids for less than two weeks and  do not have pain than it is ok to stop all together.  Plan to wean off of opioids This plan should start within one week post op of your joint replacement. Maintain the same interval or time between taking each dose and first decrease the dose.  Cut the total daily intake of opioids by one tablet each day Next start to increase the time between doses. The last dose that should be eliminated is the evening dose.      TED hose   Complete by: As directed    Use stockings (TED hose) for 2 weeks on both leg(s).  You may remove them at night for sleeping.        Follow-up Information     Durene Romans, MD. Schedule an appointment as soon as possible for a visit in 2 week(s).   Specialty: Orthopedic Surgery Contact information: [address] [place] [place] [address] [phone]                  Signed: Cassandria Anger 05/16/2023, 1:54 PM

## 2023-05-17 DIAGNOSIS — M25662 Stiffness of left knee, not elsewhere classified: Secondary | ICD-10-CM | POA: Diagnosis not present

## 2023-05-17 DIAGNOSIS — M25562 Pain in left knee: Secondary | ICD-10-CM | POA: Diagnosis not present

## 2023-05-20 DIAGNOSIS — M25562 Pain in left knee: Secondary | ICD-10-CM | POA: Diagnosis not present

## 2023-05-20 DIAGNOSIS — M25662 Stiffness of left knee, not elsewhere classified: Secondary | ICD-10-CM | POA: Diagnosis not present

## 2023-05-22 DIAGNOSIS — M25562 Pain in left knee: Secondary | ICD-10-CM | POA: Diagnosis not present

## 2023-05-22 DIAGNOSIS — M25662 Stiffness of left knee, not elsewhere classified: Secondary | ICD-10-CM | POA: Diagnosis not present

## 2023-05-24 DIAGNOSIS — M25562 Pain in left knee: Secondary | ICD-10-CM | POA: Diagnosis not present

## 2023-05-24 DIAGNOSIS — M25662 Stiffness of left knee, not elsewhere classified: Secondary | ICD-10-CM | POA: Diagnosis not present

## 2023-05-27 DIAGNOSIS — M25662 Stiffness of left knee, not elsewhere classified: Secondary | ICD-10-CM | POA: Diagnosis not present

## 2023-05-27 DIAGNOSIS — M25562 Pain in left knee: Secondary | ICD-10-CM | POA: Diagnosis not present

## 2023-05-30 DIAGNOSIS — M25562 Pain in left knee: Secondary | ICD-10-CM | POA: Diagnosis not present

## 2023-05-30 DIAGNOSIS — M25662 Stiffness of left knee, not elsewhere classified: Secondary | ICD-10-CM | POA: Diagnosis not present

## 2023-06-03 DIAGNOSIS — M25562 Pain in left knee: Secondary | ICD-10-CM | POA: Diagnosis not present

## 2023-06-03 DIAGNOSIS — M25662 Stiffness of left knee, not elsewhere classified: Secondary | ICD-10-CM | POA: Diagnosis not present

## 2023-06-05 DIAGNOSIS — M25562 Pain in left knee: Secondary | ICD-10-CM | POA: Diagnosis not present

## 2023-06-05 DIAGNOSIS — M25662 Stiffness of left knee, not elsewhere classified: Secondary | ICD-10-CM | POA: Diagnosis not present

## 2023-06-12 DIAGNOSIS — M25662 Stiffness of left knee, not elsewhere classified: Secondary | ICD-10-CM | POA: Diagnosis not present

## 2023-06-12 DIAGNOSIS — M25562 Pain in left knee: Secondary | ICD-10-CM | POA: Diagnosis not present

## 2023-06-14 DIAGNOSIS — M25562 Pain in left knee: Secondary | ICD-10-CM | POA: Diagnosis not present

## 2023-06-14 DIAGNOSIS — M25662 Stiffness of left knee, not elsewhere classified: Secondary | ICD-10-CM | POA: Diagnosis not present

## 2023-06-19 DIAGNOSIS — M25562 Pain in left knee: Secondary | ICD-10-CM | POA: Diagnosis not present

## 2023-06-19 DIAGNOSIS — M25662 Stiffness of left knee, not elsewhere classified: Secondary | ICD-10-CM | POA: Diagnosis not present

## 2023-06-21 DIAGNOSIS — M25562 Pain in left knee: Secondary | ICD-10-CM | POA: Diagnosis not present

## 2023-06-21 DIAGNOSIS — M25662 Stiffness of left knee, not elsewhere classified: Secondary | ICD-10-CM | POA: Diagnosis not present

## 2023-06-26 DIAGNOSIS — M25662 Stiffness of left knee, not elsewhere classified: Secondary | ICD-10-CM | POA: Diagnosis not present

## 2023-06-26 DIAGNOSIS — Z471 Aftercare following joint replacement surgery: Secondary | ICD-10-CM | POA: Diagnosis not present

## 2023-06-26 DIAGNOSIS — M25562 Pain in left knee: Secondary | ICD-10-CM | POA: Diagnosis not present

## 2023-06-26 DIAGNOSIS — Z96652 Presence of left artificial knee joint: Secondary | ICD-10-CM | POA: Diagnosis not present

## 2023-06-28 DIAGNOSIS — M25662 Stiffness of left knee, not elsewhere classified: Secondary | ICD-10-CM | POA: Diagnosis not present

## 2023-06-28 DIAGNOSIS — M25562 Pain in left knee: Secondary | ICD-10-CM | POA: Diagnosis not present

## 2023-07-02 DIAGNOSIS — M25562 Pain in left knee: Secondary | ICD-10-CM | POA: Diagnosis not present

## 2023-07-02 DIAGNOSIS — M25662 Stiffness of left knee, not elsewhere classified: Secondary | ICD-10-CM | POA: Diagnosis not present

## 2023-07-08 DIAGNOSIS — M25562 Pain in left knee: Secondary | ICD-10-CM | POA: Diagnosis not present

## 2023-07-08 DIAGNOSIS — M25662 Stiffness of left knee, not elsewhere classified: Secondary | ICD-10-CM | POA: Diagnosis not present

## 2023-07-11 DIAGNOSIS — M25562 Pain in left knee: Secondary | ICD-10-CM | POA: Diagnosis not present

## 2023-07-11 DIAGNOSIS — M25662 Stiffness of left knee, not elsewhere classified: Secondary | ICD-10-CM | POA: Diagnosis not present

## 2023-07-15 DIAGNOSIS — M25662 Stiffness of left knee, not elsewhere classified: Secondary | ICD-10-CM | POA: Diagnosis not present

## 2023-07-15 DIAGNOSIS — M25562 Pain in left knee: Secondary | ICD-10-CM | POA: Diagnosis not present

## 2023-07-18 DIAGNOSIS — M25562 Pain in left knee: Secondary | ICD-10-CM | POA: Diagnosis not present

## 2023-07-18 DIAGNOSIS — M25662 Stiffness of left knee, not elsewhere classified: Secondary | ICD-10-CM | POA: Diagnosis not present

## 2023-07-24 DIAGNOSIS — M25662 Stiffness of left knee, not elsewhere classified: Secondary | ICD-10-CM | POA: Diagnosis not present

## 2023-07-24 DIAGNOSIS — M25562 Pain in left knee: Secondary | ICD-10-CM | POA: Diagnosis not present

## 2023-08-06 DIAGNOSIS — M25662 Stiffness of left knee, not elsewhere classified: Secondary | ICD-10-CM | POA: Diagnosis not present

## 2023-08-06 DIAGNOSIS — M25562 Pain in left knee: Secondary | ICD-10-CM | POA: Diagnosis not present

## 2023-10-10 DIAGNOSIS — M25512 Pain in left shoulder: Secondary | ICD-10-CM | POA: Diagnosis not present

## 2023-10-10 DIAGNOSIS — M25511 Pain in right shoulder: Secondary | ICD-10-CM | POA: Diagnosis not present

## 2023-10-31 DIAGNOSIS — Z6828 Body mass index (BMI) 28.0-28.9, adult: Secondary | ICD-10-CM | POA: Diagnosis not present

## 2023-10-31 DIAGNOSIS — J0140 Acute pansinusitis, unspecified: Secondary | ICD-10-CM | POA: Diagnosis not present

## 2023-10-31 DIAGNOSIS — J4520 Mild intermittent asthma, uncomplicated: Secondary | ICD-10-CM | POA: Diagnosis not present

## 2023-11-13 ENCOUNTER — Emergency Department (HOSPITAL_COMMUNITY)

## 2023-11-13 ENCOUNTER — Encounter (HOSPITAL_COMMUNITY): Payer: Self-pay | Admitting: Emergency Medicine

## 2023-11-13 ENCOUNTER — Emergency Department (HOSPITAL_COMMUNITY)
Admission: EM | Admit: 2023-11-13 | Discharge: 2023-11-13 | Disposition: A | Discharge status: Home / Self Care | Attending: Emergency Medicine | Admitting: Emergency Medicine

## 2023-11-13 DIAGNOSIS — M542 Cervicalgia: Secondary | ICD-10-CM | POA: Diagnosis not present

## 2023-11-13 DIAGNOSIS — R0981 Nasal congestion: Secondary | ICD-10-CM | POA: Diagnosis not present

## 2023-11-13 DIAGNOSIS — Z471 Aftercare following joint replacement surgery: Secondary | ICD-10-CM | POA: Diagnosis not present

## 2023-11-13 DIAGNOSIS — R791 Abnormal coagulation profile: Secondary | ICD-10-CM | POA: Insufficient documentation

## 2023-11-13 DIAGNOSIS — R059 Cough, unspecified: Secondary | ICD-10-CM | POA: Diagnosis not present

## 2023-11-13 DIAGNOSIS — M545 Low back pain, unspecified: Secondary | ICD-10-CM | POA: Insufficient documentation

## 2023-11-13 DIAGNOSIS — I3139 Other pericardial effusion (noninflammatory): Secondary | ICD-10-CM | POA: Diagnosis not present

## 2023-11-13 DIAGNOSIS — Z7901 Long term (current) use of anticoagulants: Secondary | ICD-10-CM | POA: Diagnosis not present

## 2023-11-13 DIAGNOSIS — R079 Chest pain, unspecified: Secondary | ICD-10-CM | POA: Diagnosis not present

## 2023-11-13 DIAGNOSIS — I77810 Thoracic aortic ectasia: Secondary | ICD-10-CM | POA: Diagnosis not present

## 2023-11-13 DIAGNOSIS — R0602 Shortness of breath: Secondary | ICD-10-CM | POA: Insufficient documentation

## 2023-11-13 DIAGNOSIS — Z96612 Presence of left artificial shoulder joint: Secondary | ICD-10-CM | POA: Diagnosis not present

## 2023-11-13 DIAGNOSIS — R0789 Other chest pain: Secondary | ICD-10-CM | POA: Insufficient documentation

## 2023-11-13 DIAGNOSIS — I10 Essential (primary) hypertension: Secondary | ICD-10-CM | POA: Diagnosis not present

## 2023-11-13 LAB — CBC
HCT: 41.4 % (ref 36.0–46.0)
Hemoglobin: 13.2 g/dL (ref 12.0–15.0)
MCH: 30.3 pg (ref 26.0–34.0)
MCHC: 31.9 g/dL (ref 30.0–36.0)
MCV: 95 fL (ref 80.0–100.0)
Platelets: 262 10*3/uL (ref 150–400)
RBC: 4.36 MIL/uL (ref 3.87–5.11)
RDW: 14.9 % (ref 11.5–15.5)
WBC: 5.6 10*3/uL (ref 4.0–10.5)
nRBC: 0 % (ref 0.0–0.2)

## 2023-11-13 LAB — TROPONIN I (HIGH SENSITIVITY)
Troponin I (High Sensitivity): 5 ng/L (ref <18)
Troponin I (High Sensitivity): 7 ng/L (ref <18)

## 2023-11-13 LAB — BASIC METABOLIC PANEL WITH GFR
Anion gap: 9 (ref 5–15)
BUN: 15 mg/dL (ref 8–23)
CO2: 24 mmol/L (ref 22–32)
Calcium: 9.3 mg/dL (ref 8.9–10.3)
Chloride: 108 mmol/L (ref 98–111)
Creatinine, Ser: 0.91 mg/dL (ref 0.44–1.00)
GFR, Estimated: >60 mL/min (ref >60)
Glucose, Bld: 88 mg/dL (ref 70–99)
Potassium: 4.4 mmol/L (ref 3.5–5.1)
Sodium: 141 mmol/L (ref 135–145)

## 2023-11-13 LAB — RESP PANEL BY RT-PCR (RSV, FLU A&B, COVID)  RVPGX2
Influenza A by PCR: NEGATIVE
Influenza B by PCR: NEGATIVE
Resp Syncytial Virus by PCR: NEGATIVE
SARS Coronavirus 2 by RT PCR: NEGATIVE

## 2023-11-13 LAB — D-DIMER, QUANTITATIVE: D-Dimer, Quant: 2.17 ug{FEU}/mL — ABNORMAL HIGH (ref 0.00–0.50)

## 2023-11-13 MED ORDER — IOHEXOL 350 MG/ML SOLN
75.0000 mL | Freq: Once | INTRAVENOUS | Status: AC | PRN
  Administered 2023-11-13: 75 mL via INTRAVENOUS

## 2023-11-13 MED ORDER — IBUPROFEN 400 MG PO TABS
400.0000 mg | ORAL_TABLET | Freq: Once | ORAL | Status: AC
  Administered 2023-11-13: 400 mg via ORAL
  Filled 2023-11-13: qty 1

## 2023-11-13 NOTE — ED Provider Notes (Signed)
 73 year old female presenting to the emergency department today with pleuritic chest pain.  Initial cardiac workup has been unrevealing thus far.  Plan is for reevaluation after CT angiogram as her D-dimer was elevated.  Will reevaluate after CTA for ultimate disposition.  Physical Exam  BP (!) 159/77   Pulse 76   Temp 98 F (36.7 C) (Oral)   Resp 16   SpO2 99%   Physical Exam General: No acute distress  Procedures  Procedures  ED Course / MDM    Medical Decision Making Amount and/or Complexity of Data Reviewed Labs: ordered. Radiology: ordered.  Risk Prescription drug management.   On reassessment the patient is resting comfortably and symptoms have resolved.  CT angiogram is negative.  She is discharged with return precautions.       Durwin Glaze, MD 11/13/23 2035

## 2023-11-13 NOTE — ED Notes (Signed)
 At time of discharge. patient reported she had no chest pain on arrival.  Stated pain was more in her back and felt more like a "pleurisy pain."

## 2023-11-13 NOTE — Discharge Instructions (Signed)
 Your workup today was reassuring.  Follow-up with your doctor for reevaluation.  Return to the ER for worsening symptoms.

## 2023-11-13 NOTE — ED Triage Notes (Signed)
 Pt here from home with c/o left sie neck pain and flank pain but also with chest pain  took asa  prior to arrival

## 2023-11-13 NOTE — ED Provider Notes (Signed)
  EMERGENCY DEPARTMENT AT [facility] Provider Note   CSN: [id number] Arrival date & time: 11/13/23  1009     History  Chief Complaint  Patient presents with   Chest Pain    [name] is a 73 y.o. female.  HPI 73 year old female presents with left-sided neck pain as well as chest/flank pain.  She also feels short of breath.  Symptoms are when she first woke up this morning.  Feels like when she has had pleurisy in the past.  When she breathes she feels pain in her neck, shoulder, and left chest.  About 3 weeks ago she got a course of antibiotics for a sinus infection.  She has had some cough that went away but then now has come back this morning along with congestion.  No fevers.  The pain is not really in her chest but more in her left lower ribs and back.  She has had her left kidney removed.  No weakness or numbness in her extremities.  No headache or vision changes.  Home Medications Prior to Admission medications   Medication Sig Start Date End Date Taking? Authorizing Provider  acidophilus (RISAQUAD) CAPS capsule Take 1 capsule by mouth daily.    [provider]  albuterol (VENTOLIN HFA) 108 (90 Base) MCG/ACT inhaler Inhale 1-2 puffs into the lungs every 6 (six) hours as needed for shortness of breath or wheezing.    [provider]  apixaban (ELIQUIS) 2.5 MG TABS tablet Take 1 tablet (2.5 mg total) by mouth 2 (two) times daily. 05/10/23 06/09/23  Cassandria Anger, PA-C  methocarbamol (ROBAXIN) 500 MG tablet Take 1 tablet (500 mg total) by mouth every 6 (six) hours as needed for muscle spasms. 05/10/23   Cassandria Anger, PA-C  Multiple Vitamin (MULTIVITAMIN WITH MINERALS) TABS tablet Take 1 tablet by mouth daily.    [provider]  ondansetron (ZOFRAN) 4 MG tablet Take 1 tablet (4 mg total) by mouth every 6 (six) hours as needed for nausea. 05/10/23   Cassandria Anger, PA-C  oxyCODONE (OXY IR/ROXICODONE) 5 MG immediate release  tablet Take 1 tablet (5 mg total) by mouth every 4 (four) hours as needed for severe pain. 05/10/23   Cassandria Anger, PA-C  polyethylene glycol (MIRALAX / GLYCOLAX) 17 g packet Take 17 g by mouth 2 (two) times daily. 05/10/23   Cassandria Anger, PA-C  promethazine (PHENERGAN) 12.5 MG tablet Take 1 tablet (12.5 mg total) by mouth every 6 (six) hours as needed for refractory nausea / vomiting (Take for persistent nausea/vomiting despite zofran). 05/10/23   Cassandria Anger, PA-C      Allergies    Patient has no known allergies.    Review of Systems   Review of Systems  Constitutional:  Negative for fever.  Respiratory:  Positive for shortness of breath.   Cardiovascular:  Positive for chest pain. Negative for leg swelling.  Genitourinary:  Positive for flank pain. Negative for dysuria.  Musculoskeletal:  Positive for neck pain.  Neurological:  Negative for weakness and numbness.    Physical Exam Updated Vital Signs BP (!) 159/77   Pulse 76   Temp 98 F (36.7 C) (Oral)   Resp 16   SpO2 99%  Physical Exam Vitals and nursing note reviewed.  Constitutional:      General: She is not in acute distress.    Appearance: She is well-developed. She is not ill-appearing or diaphoretic.  HENT:  Head: Normocephalic and atraumatic.  Neck:   Cardiovascular:     Rate and Rhythm: Normal rate and regular rhythm.     Pulses:          Radial pulses are 2+ on the left side.     Heart sounds: Normal heart sounds.  Pulmonary:     Effort: Pulmonary effort is normal.     Breath sounds: Normal breath sounds.    Chest:    Abdominal:     Palpations: Abdomen is soft.     Tenderness: There is no abdominal tenderness.  Musculoskeletal:     Cervical back: Muscular tenderness present. No spinous process tenderness.  Skin:    General: Skin is warm and dry.  Neurological:     Mental Status: She is alert.     Comments: 5/5 strength in both upper extremities. Grossly normal sensation     ED  Results / Procedures / Treatments   Labs (all labs ordered are listed, but only abnormal results are displayed) Labs Reviewed  D-DIMER, QUANTITATIVE (NOT AT [facility]) - Abnormal; Notable for the following components:      Result Value   D-Dimer, Quant 2.17 (*)    All other components within normal limits  RESP PANEL BY RT-PCR (RSV, FLU A&B, COVID)  [id number]  BASIC METABOLIC PANEL WITH GFR  CBC  TROPONIN I (HIGH SENSITIVITY)  TROPONIN I (HIGH SENSITIVITY)    EKG EKG Interpretation Date/Time:  Wednesday November 13 2023 10:26:54 EDT Ventricular Rate:  76 PR Interval:  142 QRS Duration:  80 QT Interval:  372 QTC Calculation: 418 R Axis:   37  Text Interpretation: Normal sinus rhythm Nonspecific ST abnormality Confirmed by Pricilla Loveless [phone]) on 11/13/2023 10:51:44 AM  Radiology DG Chest 2 View Result Date: 11/13/2023 CLINICAL DATA:  Chest pain. EXAM: CHEST - 2 VIEW COMPARISON:  August 29, 2022. FINDINGS: The heart size and mediastinal contours are within normal limits. Both lungs are clear. Status post left shoulder arthroplasty. IMPRESSION: No active cardiopulmonary disease. Electronically Signed   By: Lupita Raider M.D.   On: 11/13/2023 12:15    Procedures Procedures    Medications Ordered in ED Medications  ibuprofen (ADVIL) tablet 400 mg (400 mg Oral Given 11/13/23 1516)    ED Course/ Medical Decision Making/ A&P                                 Medical Decision Making Amount and/or Complexity of Data Reviewed Labs: ordered.    Details: Normal troponin x 2.  Elevated D-dimer Radiology: ordered.    Details: No pneumothorax ECG/medicine tests: ordered and independent interpretation performed.    Details: No ischemia  Risk Prescription drug management.   Patient presents with neck and chest/side pain.  Probably pleurisy but with her presentation a D-dimer was sent and is elevated so a CTA has been ordered.  She is feeling better with some ibuprofen.  Care transferred  to Dr. Rhae Hammock.        Final Clinical Impression(s) / ED Diagnoses Final diagnoses:  None    Rx / DC Orders ED Discharge Orders     None         Pricilla Loveless, MD 11/13/23 [phone]

## 2023-11-28 DIAGNOSIS — H40023 Open angle with borderline findings, high risk, bilateral: Secondary | ICD-10-CM | POA: Diagnosis not present

## 2023-11-28 DIAGNOSIS — H53413 Scotoma involving central area, bilateral: Secondary | ICD-10-CM | POA: Diagnosis not present

## 2024-02-12 DIAGNOSIS — M25511 Pain in right shoulder: Secondary | ICD-10-CM | POA: Diagnosis not present

## 2024-02-12 DIAGNOSIS — M25512 Pain in left shoulder: Secondary | ICD-10-CM | POA: Diagnosis not present

## 2024-03-03 DIAGNOSIS — Z6827 Body mass index (BMI) 27.0-27.9, adult: Secondary | ICD-10-CM | POA: Diagnosis not present

## 2024-03-03 DIAGNOSIS — N3000 Acute cystitis without hematuria: Secondary | ICD-10-CM | POA: Diagnosis not present

## 2024-06-18 DIAGNOSIS — M25511 Pain in right shoulder: Secondary | ICD-10-CM | POA: Diagnosis not present

## 2024-06-18 DIAGNOSIS — M25512 Pain in left shoulder: Secondary | ICD-10-CM | POA: Diagnosis not present

## 2024-07-10 DIAGNOSIS — Z6828 Body mass index (BMI) 28.0-28.9, adult: Secondary | ICD-10-CM | POA: Diagnosis not present

## 2024-07-10 DIAGNOSIS — J209 Acute bronchitis, unspecified: Secondary | ICD-10-CM | POA: Diagnosis not present
